# Patient Record
Sex: Female | Born: 1970
Health system: Southern US, Community
[De-identification: ages and names within clinical notes are randomized; demographics above are authoritative.]

## PROBLEM LIST (undated history)

## (undated) DIAGNOSIS — F419 Anxiety disorder, unspecified: Secondary | ICD-10-CM

## (undated) DIAGNOSIS — E079 Disorder of thyroid, unspecified: Secondary | ICD-10-CM

## (undated) DIAGNOSIS — C4490 Unspecified malignant neoplasm of skin, unspecified: Secondary | ICD-10-CM

## (undated) DIAGNOSIS — R011 Cardiac murmur, unspecified: Secondary | ICD-10-CM

## (undated) DIAGNOSIS — E119 Type 2 diabetes mellitus without complications: Secondary | ICD-10-CM

## (undated) DIAGNOSIS — B029 Zoster without complications: Secondary | ICD-10-CM

## (undated) DIAGNOSIS — Z923 Personal history of irradiation: Secondary | ICD-10-CM

## (undated) DIAGNOSIS — K5900 Constipation, unspecified: Secondary | ICD-10-CM

## (undated) DIAGNOSIS — B019 Varicella without complication: Secondary | ICD-10-CM

## (undated) DIAGNOSIS — G4730 Sleep apnea, unspecified: Secondary | ICD-10-CM

## (undated) DIAGNOSIS — C50919 Malignant neoplasm of unspecified site of unspecified female breast: Secondary | ICD-10-CM

## (undated) DIAGNOSIS — M1990 Unspecified osteoarthritis, unspecified site: Secondary | ICD-10-CM

## (undated) HISTORY — DX: Cardiac murmur, unspecified: R01.1

## (undated) HISTORY — DX: Varicella without complication: B01.9

## (undated) HISTORY — PX: HEMORRHOID BANDING: SHX5850

## (undated) HISTORY — DX: Unspecified osteoarthritis, unspecified site: M19.90

## (undated) HISTORY — DX: Anxiety disorder, unspecified: F41.9

## (undated) HISTORY — PX: OTHER SURGICAL HISTORY: SHX169

## (undated) HISTORY — DX: Sleep apnea, unspecified: G47.30

## (undated) HISTORY — DX: Constipation, unspecified: K59.00

## (undated) HISTORY — DX: Disorder of thyroid, unspecified: E07.9

## (undated) HISTORY — DX: Type 2 diabetes mellitus without complications: E11.9

## (undated) HISTORY — DX: Zoster without complications: B02.9

---

## 2008-05-10 ENCOUNTER — Ambulatory Visit: Payer: Self-pay | Admitting: Family Medicine

## 2008-05-27 ENCOUNTER — Ambulatory Visit: Payer: Self-pay | Admitting: Family Medicine

## 2009-06-13 ENCOUNTER — Ambulatory Visit (HOSPITAL_COMMUNITY): Admission: RE | Admit: 2009-06-13 | Discharge: 2009-06-13 | Payer: Self-pay | Admitting: Specialist

## 2009-11-08 ENCOUNTER — Other Ambulatory Visit: Payer: Self-pay | Admitting: Obstetrics and Gynecology

## 2010-05-22 ENCOUNTER — Ambulatory Visit: Payer: Self-pay

## 2010-06-10 LAB — HM PAP SMEAR: HM Pap smear: NORMAL

## 2011-06-06 ENCOUNTER — Ambulatory Visit: Payer: Self-pay | Admitting: Obstetrics and Gynecology

## 2012-02-20 DIAGNOSIS — C50919 Malignant neoplasm of unspecified site of unspecified female breast: Secondary | ICD-10-CM

## 2012-02-20 DIAGNOSIS — Z923 Personal history of irradiation: Secondary | ICD-10-CM

## 2012-02-20 HISTORY — DX: Personal history of irradiation: Z92.3

## 2012-02-20 HISTORY — DX: Malignant neoplasm of unspecified site of unspecified female breast: C50.919

## 2012-03-21 ENCOUNTER — Telehealth: Payer: Self-pay | Admitting: Internal Medicine

## 2012-03-21 ENCOUNTER — Other Ambulatory Visit: Payer: Self-pay | Admitting: Internal Medicine

## 2012-03-21 MED ORDER — CIPROFLOXACIN HCL 250 MG PO TABS: 250.0000 mg | ORAL_TABLET | Freq: Two times a day (BID) | ORAL | Status: DC

## 2012-03-21 MED ORDER — AZITHROMYCIN 500 MG PO TABS: 500.0000 mg | ORAL_TABLET | Freq: Every day | ORAL | Status: DC

## 2012-03-21 NOTE — Telephone Encounter (Signed)
Pt was calling and saying you had told her to get set up but she was already set up for an appt with you in April. She wasn't sure if you needed additional information and wanted me to send you a message to make sure there wasn't anything else you needed ???

## 2012-03-21 NOTE — Telephone Encounter (Signed)
No, just needed to make sure she was set up. i have no way of telling when I pull up her chart and no provider is listed.

## 2012-06-09 ENCOUNTER — Encounter: Payer: Self-pay | Admitting: *Deleted

## 2012-06-09 ENCOUNTER — Ambulatory Visit (INDEPENDENT_AMBULATORY_CARE_PROVIDER_SITE_OTHER): Payer: No Typology Code available for payment source | Admitting: Internal Medicine

## 2012-06-09 ENCOUNTER — Encounter: Payer: Self-pay | Admitting: Internal Medicine

## 2012-06-09 VITALS — BP 108/68 | HR 81 | Temp 98.2°F | Resp 16 | Ht 60.0 in | Wt 137.5 lb

## 2012-06-09 DIAGNOSIS — Z8639 Personal history of other endocrine, nutritional and metabolic disease: Secondary | ICD-10-CM

## 2012-06-09 DIAGNOSIS — Z862 Personal history of diseases of the blood and blood-forming organs and certain disorders involving the immune mechanism: Secondary | ICD-10-CM

## 2012-06-09 DIAGNOSIS — G8929 Other chronic pain: Secondary | ICD-10-CM

## 2012-06-09 DIAGNOSIS — M170 Bilateral primary osteoarthritis of knee: Secondary | ICD-10-CM

## 2012-06-09 DIAGNOSIS — M25539 Pain in unspecified wrist: Secondary | ICD-10-CM

## 2012-06-09 DIAGNOSIS — Z1239 Encounter for other screening for malignant neoplasm of breast: Secondary | ICD-10-CM

## 2012-06-09 DIAGNOSIS — I059 Rheumatic mitral valve disease, unspecified: Secondary | ICD-10-CM

## 2012-06-09 DIAGNOSIS — E559 Vitamin D deficiency, unspecified: Secondary | ICD-10-CM

## 2012-06-09 DIAGNOSIS — I341 Nonrheumatic mitral (valve) prolapse: Secondary | ICD-10-CM

## 2012-06-09 MED ORDER — MELOXICAM 15 MG PO TABS: 15.0000 mg | ORAL_TABLET | Freq: Every day | ORAL | Status: DC

## 2012-06-09 NOTE — Assessment & Plan Note (Signed)
She avoids sun,  Has prior low D,  Takes 5000 units daily.  Will check level.

## 2012-06-09 NOTE — Assessment & Plan Note (Signed)
dicussed etiologies,  Neurologic exam is normal. Secondary to keyboard use.

## 2012-06-09 NOTE — Assessment & Plan Note (Signed)
With prior episodes of syncope now quiescent,  And palpitations, occurring infreqently. Prior ECHO done in [place]

## 2012-06-09 NOTE — Assessment & Plan Note (Signed)
Checking serologies for autoimmune and crystal induced arthropathies.  Trial of glucosamine followed by meloxicam

## 2012-06-09 NOTE — Assessment & Plan Note (Signed)
Treated with PTU for 2 years in 2009.  With resolution .  Repeat TSH due

## 2012-06-09 NOTE — Patient Instructions (Addendum)
Ok to try Osteo biflex a minimum of 6 weeks as a jont lubricant  May use meloxicam  15 mg daily in the morning with breakfast for wrist pain ,

## 2012-06-09 NOTE — Progress Notes (Signed)
Patient ID: Patricia Crane, female   DOB: 09/21/1970, 42 y.o.   MRN: [id number]   Patient Active Problem List  Diagnosis  . Unspecified vitamin D deficiency  . Arthritis of both knees    Subjective:  CC:   Chief Complaint  Patient presents with  . Establish Care    HPI:   Patricia Crane is a 42 y.o. female who presents as a new patient to establish primary care with the chief complaint of  Joint pain ,  Both knees ,  Some blunt trauma  As a child, very stifdf after prolonged sitting.    aggravatyed by walking fast.  Back of knee feels enflamed at times.  noi increased pain lately,  Despite flying back from [place] April 9 .  Not bothered by yoga. Tender on the medial sides of the left, knee anf both sides of the right.  Both elbows and first metartarsal of left foot.,   Bilateral wrist pain,  Numbness in arms at night when sleeping on opposite side  Despite using a king size pillow for body bolster.  Seeing chiropractor which has improved. Cherre Huger. .  Adjustments done to cervical spine,  Spine was "straight"   History of low vitamin d  Hyperthyroid, treated with PTU for 2 years.  2009-2010.  Treated by [facility], syndrome resovled without I 131 or surgeyr   Heart murmur, history of palpitations which prior syncopal epsiodes worked in  Up [place] with ECHO and 24 Holter monitor   wotkd she has MVP.  Has not had a xyncopal epsidoe sin years, but epidose of palpitations still occurs about every 4 months,  Lasting 10 seconds      Breast pain initially on left ,  Now on right brought on by lying on right side. Notes some water retention after every flight to [place]    Past Medical History  Diagnosis Date  . Chicken pox   . Shingles   . Heart murmur   . Thyroid disease     Past Surgical History  Procedure Laterality Date  . Lasix Bilateral     Family History  Problem Relation Age of Onset  . Hyperlipidemia Mother   . Heart disease Mother   . Dementia Mother 82    alzheimers  . Cancer Father     tonsil ca  . Hyperlipidemia Sister   . Hyperlipidemia Brother   . Heart disease Maternal Aunt   . Hyperlipidemia Maternal Aunt   . Hypertension Maternal Aunt   . Heart disease Maternal Uncle   . Hyperlipidemia Maternal Uncle   . Stroke Maternal Uncle   . Hypertension Maternal Uncle   . Cancer Paternal Aunt 77    ovarian ca  . Drug abuse Paternal Aunt   . Cancer Paternal Uncle     lung CA ,  smoker    History   Social History  . Marital Status: Married    Spouse Name: N/A    Number of Children: N/A  . Years of Education: N/A   Occupational History  . Not on file.   Social History Main Topics  . Smoking status: Former Smoker -- 2.00 packs/day    Types: Cigarettes    Quit date: 06/10/2003  . Smokeless tobacco: Never Used     Comment: social smoker  . Alcohol Use: Yes     Comment: occasionally  . Drug Use: Not on file  . Sexually Active: Not on file   Other Topics Concern  .  Not on file   Social History Narrative  . No narrative on file       @ALLHX @    Review of Systems:   The remainder of the review of systems was negative except those addressed in the HPI.       Objective:  BP 108/68  Pulse 81  Temp(Src) 98.2 F (36.8 C) (Oral)  Resp 16  Ht 5' (1.524 m)  Wt 137 lb 8 oz (62.37 kg)  BMI 26.85 kg/m2  SpO2 98%  LMP 05/29/2012  General appearance: alert, cooperative and appears stated age Ears: normal TM's and external ear canals both ears Throat: lips, mucosa, and tongue normal; teeth and gums normal Neck: no adenopathy, no carotid bruit, supple, symmetrical, trachea midline and thyroid not enlarged, symmetric, no tenderness/mass/nodules Back: symmetric, no curvature. ROM normal. No CVA tenderness. Lungs: clear to auscultation bilaterally Heart: regular rate and rhythm, S1, S2 normal, no murmur, click, rub or gallop Abdomen: soft, non-tender; bowel sounds normal; no masses,  no organomegaly Pulses: 2+ and  symmetric Skin: Skin color, texture, turgor normal. No rashes or lesions Lymph nodes: Cervical, supraclavicular, and axillary nodes normal.  Assessment and Plan:  Arthritis of both knees Checking serologies for autoimmune and crystal induced arthropathies.  Trial of glucosamine followed by meloxicam  Unspecified vitamin D deficiency She avoids sun,  Has prior low D,  Takes 5000 units daily.  Will check level.   Mitral valve prolapse syndrome With prior episodes of syncope now quiescent,  And palpitations, occurring infreqently. Prior ECHO done in [place]    Wrist pain, chronic dicussed etiologies,  Neurologic exam is normal. Secondary to keyboard use.   History of Graves' disease Treated with PTU for 2 years in 2009.  With resolution .  Repeat TSH due   A total of 45 minutes was spent with patient more than half of which was spent in counseling, reviewing records from other prviders and coordination of care.  Updated Medication List Outpatient Encounter Prescriptions as of 06/09/2012  Medication Sig Dispense Refill  . Evening Primrose Oil 500 MG CAPS Take 500 mg by mouth daily.      . Melatonin-Pyridoxine (MELATIN) 3-1 MG TABS Take 3 mg by mouth at bedtime.      . Multiple Vitamins-Minerals (MULTIVITAMIN WITH MINERALS) tablet Take 1 tablet by mouth daily.      . Probiotic Product (ALIGN) 4 MG CAPS Take 4 mg by mouth daily.      Marland Kitchen azithromycin (ZITHROMAX) 500 MG tablet Take 1 tablet (500 mg total) by mouth daily.  7 tablet  0  . ciprofloxacin (CIPRO) 250 MG tablet Take 1 tablet (250 mg total) by mouth 2 (two) times daily.  14 tablet  0  . meloxicam (MOBIC) 15 MG tablet Take 1 tablet (15 mg total) by mouth daily.  30 tablet  3   No facility-administered encounter medications on file as of 06/09/2012.

## 2012-06-11 ENCOUNTER — Other Ambulatory Visit: Payer: Self-pay | Admitting: Internal Medicine

## 2012-06-16 LAB — IRON AND TIBC
Iron: 50 ug/dL (ref 35–155)
UIBC: 297 ug/dL (ref 150–375)

## 2012-06-16 LAB — CBC WITH DIFFERENTIAL
Basos: 1 % (ref 0–3)
Eos: 2 % (ref 0–5)
Eosinophils Absolute: 0.1 10*3/uL (ref 0.0–0.4)
Immature Grans (Abs): 0 10*3/uL (ref 0.0–0.1)
Lymphs: 37 % (ref 14–46)
MCH: 31.3 pg (ref 26.6–33.0)
MCHC: 34 g/dL (ref 31.5–35.7)
MCV: 92 fL (ref 79–97)
Monocytes Absolute: 0.3 10*3/uL (ref 0.1–0.9)
Neutrophils Relative %: 55 % (ref 40–74)
WBC: 6 10*3/uL (ref 3.4–10.8)

## 2012-06-16 LAB — B12 AND FOLATE PANEL
Folate: 14.1 ng/mL (ref >3.0)
Vitamin B-12: 692 pg/mL (ref 211–946)

## 2012-06-16 LAB — COMPREHENSIVE METABOLIC PANEL
AST: 14 IU/L (ref 0–40)
Albumin: 4.4 g/dL (ref 3.5–5.5)
BUN/Creatinine Ratio: 15 (ref 9–23)
BUN: 11 mg/dL (ref 6–24)
CO2: 23 mmol/L (ref 19–28)
Chloride: 102 mmol/L (ref 97–108)
Creatinine, Ser: 0.72 mg/dL (ref 0.57–1.00)
GFR calc non Af Amer: 103 mL/min/{1.73_m2} (ref >59)
Glucose: 103 mg/dL — ABNORMAL HIGH (ref 65–99)
Sodium: 140 mmol/L (ref 134–144)
Total Bilirubin: 0.3 mg/dL (ref 0.0–1.2)

## 2012-06-16 LAB — TSH: TSH: 1.31 u[IU]/mL (ref 0.450–4.500)

## 2012-06-16 LAB — LIPID PANEL W/O CHOL/HDL RATIO
Cholesterol, Total: 199 mg/dL (ref 100–199)
HDL: 63 mg/dL (ref >39)
VLDL Cholesterol Cal: 14 mg/dL (ref 5–40)

## 2012-06-17 ENCOUNTER — Telehealth: Payer: Self-pay | Admitting: Internal Medicine

## 2012-06-17 ENCOUNTER — Encounter: Payer: Self-pay | Admitting: Internal Medicine

## 2012-06-17 ENCOUNTER — Ambulatory Visit: Payer: Self-pay | Admitting: Internal Medicine

## 2012-06-17 DIAGNOSIS — R920 Mammographic microcalcification found on diagnostic imaging of breast: Secondary | ICD-10-CM

## 2012-06-17 MED ORDER — ERGOCALCIFEROL 1.25 MG (50000 UT) PO CAPS: 50000.0000 [IU] | ORAL_CAPSULE | ORAL | Status: DC

## 2012-06-17 NOTE — Telephone Encounter (Signed)
Patricia Crane's mammogram  Was incomplete on the left.  They are requesting additional images.  I will order the additonal imaging.

## 2012-06-17 NOTE — Addendum Note (Signed)
Addended by: Sherlene Shams on: 06/17/2012 12:56 PM   Modules accepted: Orders

## 2012-06-18 ENCOUNTER — Ambulatory Visit: Payer: Self-pay | Admitting: Internal Medicine

## 2012-06-18 NOTE — Telephone Encounter (Signed)
Dr. Marice Potter office calling to report that pt is Category 5.  States pt is very upset and is ready to proceed with next steps.

## 2012-06-18 NOTE — Telephone Encounter (Signed)
Amanda @ [facility] to contact patient to schedule additional imaging.

## 2012-06-19 ENCOUNTER — Telehealth: Payer: Self-pay | Admitting: Emergency Medicine

## 2012-06-19 HISTORY — PX: BREAST BIOPSY: SHX20

## 2012-06-19 NOTE — Telephone Encounter (Signed)
Patient wants to see Dr Michela Pitcher

## 2012-06-19 NOTE — Telephone Encounter (Signed)
LVM for pt to return my call to offer surgeons in our area and to proceed with an apt.

## 2012-06-20 ENCOUNTER — Encounter: Payer: Self-pay | Admitting: Emergency Medicine

## 2012-06-23 ENCOUNTER — Telehealth: Payer: Self-pay

## 2012-06-23 NOTE — Telephone Encounter (Signed)
Patient sent a message to set up appointment for a pap smear. Called and left message for patient to give a call back to office to set up for appointment

## 2012-06-25 ENCOUNTER — Encounter: Payer: Self-pay | Admitting: Internal Medicine

## 2012-06-25 ENCOUNTER — Ambulatory Visit: Payer: Self-pay | Admitting: Oncology

## 2012-06-25 DIAGNOSIS — C50919 Malignant neoplasm of unspecified site of unspecified female breast: Secondary | ICD-10-CM | POA: Insufficient documentation

## 2012-06-25 DIAGNOSIS — Z853 Personal history of malignant neoplasm of breast: Secondary | ICD-10-CM | POA: Insufficient documentation

## 2012-06-26 ENCOUNTER — Ambulatory Visit: Payer: Self-pay | Admitting: Oncology

## 2012-07-02 ENCOUNTER — Other Ambulatory Visit: Payer: Self-pay | Admitting: Oncology

## 2012-07-02 DIAGNOSIS — C50912 Malignant neoplasm of unspecified site of left female breast: Secondary | ICD-10-CM

## 2012-07-07 ENCOUNTER — Encounter: Payer: Self-pay | Admitting: Internal Medicine

## 2012-07-15 ENCOUNTER — Other Ambulatory Visit: Payer: No Typology Code available for payment source

## 2012-07-20 ENCOUNTER — Ambulatory Visit: Payer: Self-pay | Admitting: Oncology

## 2012-07-23 ENCOUNTER — Encounter: Payer: Self-pay | Admitting: Internal Medicine

## 2012-07-23 ENCOUNTER — Ambulatory Visit (INDEPENDENT_AMBULATORY_CARE_PROVIDER_SITE_OTHER): Payer: No Typology Code available for payment source | Admitting: Internal Medicine

## 2012-07-23 VITALS — BP 102/68 | HR 80 | Temp 98.4°F | Resp 16 | Ht 60.0 in | Wt 138.2 lb

## 2012-07-23 DIAGNOSIS — D0590 Unspecified type of carcinoma in situ of unspecified breast: Secondary | ICD-10-CM

## 2012-07-23 DIAGNOSIS — Z124 Encounter for screening for malignant neoplasm of cervix: Secondary | ICD-10-CM

## 2012-07-23 DIAGNOSIS — D0511 Intraductal carcinoma in situ of right breast: Secondary | ICD-10-CM

## 2012-07-23 DIAGNOSIS — Z0000 Encounter for general adult medical examination without abnormal findings: Secondary | ICD-10-CM

## 2012-07-23 MED ORDER — ALPRAZOLAM 0.25 MG PO TABS: 0.2500 mg | ORAL_TABLET | Freq: Two times a day (BID) | ORAL | Status: DC | PRN

## 2012-07-23 NOTE — Progress Notes (Signed)
Patient ID: Patricia Crane, female   DOB: [birth year], 42 y.o.   MRN: [id number]   Subjective:     Patricia Crane is a 42 y.o. female and is here for a comprehensive physical exam.   History   Social History  . Marital Status: Married    Spouse Name: N/A    Number of Children: N/A  . Years of Education: N/A   Occupational History  . Not on file.   Social History Main Topics  . Smoking status: Former Smoker -- 2.00 packs/day    Types: Cigarettes    Quit date: 06/10/2003  . Smokeless tobacco: Never Used     Comment: social smoker  . Alcohol Use: Yes     Comment: occasionally  . Drug Use: Not on file  . Sexually Active: Not on file   Other Topics Concern  . Not on file   Social History Narrative  . No narrative on file   Health Maintenance  Topic Date Due  . Tetanus/tdap  08/09/1987  . Influenza Vaccine  10/20/2012  . Pap Smear  07/24/2015    The following portions of the patient's history were reviewed and updated as appropriate: allergies, current medications, past family history, past medical history, past social history, past surgical history and problem list.  Review of Systems A comprehensive review of systems was negative.   Objective:  BP 102/68  Pulse 80  Temp(Src) 98.4 F (36.9 C) (Oral)  Resp 16  Ht 5' (1.524 m)  Wt 138 lb 4 oz (62.71 kg)  BMI 27 kg/m2  SpO2 98%  LMP 05/30/2012  General Appearance:    Alert, cooperative, no distress, appears stated age  Head:    Normocephalic, without obvious abnormality, atraumatic  Eyes:    PERRL, conjunctiva/corneas clear, EOM's intact, fundi    benign, both eyes  Ears:    Normal TM's and external ear canals, both ears  Nose:   Nares normal, septum midline, mucosa normal, no drainage    or sinus tenderness  Throat:   Lips, mucosa, and tongue normal; teeth and gums normal  Neck:   Supple, symmetrical, trachea midline, no adenopathy;    thyroid:  no enlargement/tenderness/nodules; no carotid   bruit or JVD   Back:     Symmetric, no curvature, ROM normal, no CVA tenderness  Lungs:     Clear to auscultation bilaterally, respirations unlabored  Chest Wall:    No tenderness or deformity   Heart:    Regular rate and rhythm, S1 and S2 normal, no murmur, rub   or gallop  Breast Exam:    Tenderness without mass on right medial ,  Resolving bruise.  Mass appreciate on left per mammogram  Abdomen:     Soft, non-tender, bowel sounds active all four quadrants,    no masses, no organomegaly  Genitalia:    Pelvic: cervix normal in appearance, external genitalia normal, no adnexal masses or tenderness, no cervical motion tenderness, rectovaginal septum normal, uterus normal size, shape, and consistency and vagina normal without discharge  Extremities:   Extremities normal, atraumatic, no cyanosis or edema  Pulses:   2+ and symmetric all extremities  Skin:   Skin color, texture, turgor normal, no rashes or lesions  Lymph nodes:   Cervical, supraclavicular, and axillary nodes normal  Neurologic:   CNII-XII intact, normal strength, sensation and reflexes    throughout    Assessment:   Neoplasm of breast, primary tumor staging category Tis: ductal carcinoma in situ (DCIS)  Lumpectomy and treatment TBD,  Dr Michela Pitcher for surgery and Dr Koleen Nimrod managing treatment  Routine general medical examination at a health care facility Annual comprehensive exam was done including breast, pelvic and PAP smear. All screenings have been addressed .    Updated Medication List Outpatient Encounter Prescriptions as of 07/23/2012  Medication Sig Dispense Refill  . ergocalciferol (DRISDOL) 50000 UNITS capsule Take 1 capsule (50,000 Units total) by mouth once a week.  4 capsule  0  . Melatonin-Pyridoxine (MELATIN) 3-1 MG TABS Take 3 mg by mouth at bedtime.      . Multiple Vitamins-Minerals (MULTIVITAMIN WITH MINERALS) tablet Take 1 tablet by mouth daily.      . Probiotic Product (ALIGN) 4 MG CAPS Take 4 mg by mouth daily.      Marland Kitchen  ALPRAZolam (XANAX) 0.25 MG tablet Take 1 tablet (0.25 mg total) by mouth 2 (two) times daily as needed for sleep or anxiety.  30 tablet  0  . azithromycin (ZITHROMAX) 500 MG tablet Take 1 tablet (500 mg total) by mouth daily.  7 tablet  0  . ciprofloxacin (CIPRO) 250 MG tablet Take 1 tablet (250 mg total) by mouth 2 (two) times daily.  14 tablet  0  . Evening Primrose Oil 500 MG CAPS Take 500 mg by mouth daily.      . meloxicam (MOBIC) 15 MG tablet Take 1 tablet (15 mg total) by mouth daily.  30 tablet  3   No facility-administered encounter medications on file as of 07/23/2012.

## 2012-07-24 ENCOUNTER — Other Ambulatory Visit (HOSPITAL_COMMUNITY)
Admission: RE | Admit: 2012-07-24 | Discharge: 2012-07-24 | Disposition: A | Discharge status: Home / Self Care | Payer: No Typology Code available for payment source | Source: Ambulatory Visit | Attending: Internal Medicine | Admitting: Internal Medicine

## 2012-07-24 DIAGNOSIS — Z1151 Encounter for screening for human papillomavirus (HPV): Secondary | ICD-10-CM | POA: Insufficient documentation

## 2012-07-24 DIAGNOSIS — Z01419 Encounter for gynecological examination (general) (routine) without abnormal findings: Secondary | ICD-10-CM | POA: Insufficient documentation

## 2012-07-25 NOTE — Assessment & Plan Note (Signed)
Annual comprehensive exam was done including breast, pelvic and PAP smear. All screenings have been addressed .  

## 2012-07-25 NOTE — Assessment & Plan Note (Signed)
Lumpectomy and treatment TBD,  Dr Michela Pitcher for surgery and Dr Koleen Nimrod managing treatment

## 2012-07-28 ENCOUNTER — Encounter: Payer: Self-pay | Admitting: Internal Medicine

## 2012-07-31 ENCOUNTER — Encounter: Payer: Self-pay | Admitting: Internal Medicine

## 2012-08-11 ENCOUNTER — Ambulatory Visit
Admission: RE | Admit: 2012-08-11 | Discharge: 2012-08-11 | Disposition: A | Discharge status: Home / Self Care | Payer: No Typology Code available for payment source | Source: Ambulatory Visit | Attending: Oncology | Admitting: Oncology

## 2012-08-11 DIAGNOSIS — C50912 Malignant neoplasm of unspecified site of left female breast: Secondary | ICD-10-CM

## 2012-08-11 MED ORDER — GADOBENATE DIMEGLUMINE 529 MG/ML IV SOLN
12.0000 mL | Freq: Once | INTRAVENOUS | Status: AC | PRN
  Administered 2012-08-11: 12 mL via INTRAVENOUS

## 2012-08-12 ENCOUNTER — Ambulatory Visit: Payer: Self-pay | Admitting: Surgery

## 2012-08-12 LAB — BASIC METABOLIC PANEL
Anion Gap: 4 — ABNORMAL LOW (ref 7–16)
BUN: 11 mg/dL (ref 7–18)
Calcium, Total: 9 mg/dL (ref 8.5–10.1)
EGFR (Non-African Amer.): >60
Osmolality: 277 (ref 275–301)
Potassium: 4 mmol/L (ref 3.5–5.1)
Sodium: 139 mmol/L (ref 136–145)

## 2012-08-12 LAB — CBC WITH DIFFERENTIAL/PLATELET
Basophil #: 0 10*3/uL (ref 0.0–0.1)
Eosinophil #: 0.1 10*3/uL (ref 0.0–0.7)
HCT: 37.9 % (ref 35.0–47.0)
HGB: 13 g/dL (ref 12.0–16.0)
Lymphocyte #: 1.9 10*3/uL (ref 1.0–3.6)
Lymphocyte %: 34.4 %
MCH: 30.9 pg (ref 26.0–34.0)
MCV: 91 fL (ref 80–100)
Monocyte #: 0.4 x10 3/mm (ref 0.2–0.9)
Monocyte %: 6.7 %
Neutrophil #: 3.1 10*3/uL (ref 1.4–6.5)
Neutrophil %: 56 %
Platelet: 313 10*3/uL (ref 150–440)
RBC: 4.19 10*6/uL (ref 3.80–5.20)
RDW: 13.9 % (ref 11.5–14.5)
WBC: 5.5 10*3/uL (ref 3.6–11.0)

## 2012-08-12 LAB — HCG, QUANTITATIVE, PREGNANCY: Beta Hcg, Quant.: <1 m[IU]/mL — ABNORMAL LOW

## 2012-08-18 ENCOUNTER — Ambulatory Visit: Payer: Self-pay | Admitting: Surgery

## 2012-08-18 HISTORY — PX: BREAST LUMPECTOMY: SHX2

## 2012-08-19 LAB — CBC WITH DIFFERENTIAL/PLATELET
Basophil %: 0.1 %
HCT: 31.6 % — ABNORMAL LOW (ref 35.0–47.0)
HGB: 10.5 g/dL — ABNORMAL LOW (ref 12.0–16.0)
Lymphocyte #: 2 10*3/uL (ref 1.0–3.6)
Lymphocyte %: 18.3 %
MCH: 30.5 pg (ref 26.0–34.0)
MCHC: 33.1 g/dL (ref 32.0–36.0)
MCV: 92 fL (ref 80–100)
Monocyte #: 0.6 x10 3/mm (ref 0.2–0.9)
Neutrophil #: 8.5 10*3/uL — ABNORMAL HIGH (ref 1.4–6.5)
Platelet: 271 10*3/uL (ref 150–440)
RDW: 13.6 % (ref 11.5–14.5)

## 2012-08-26 ENCOUNTER — Ambulatory Visit: Payer: Self-pay | Admitting: Oncology

## 2012-08-29 LAB — PATHOLOGY REPORT

## 2012-09-03 ENCOUNTER — Ambulatory Visit: Payer: No Typology Code available for payment source | Admitting: Internal Medicine

## 2012-09-05 ENCOUNTER — Ambulatory Visit: Payer: No Typology Code available for payment source | Admitting: Internal Medicine

## 2012-09-16 ENCOUNTER — Encounter: Payer: Self-pay | Admitting: Internal Medicine

## 2012-09-16 ENCOUNTER — Ambulatory Visit (INDEPENDENT_AMBULATORY_CARE_PROVIDER_SITE_OTHER): Payer: No Typology Code available for payment source | Admitting: Internal Medicine

## 2012-09-16 VITALS — BP 106/68 | HR 73 | Temp 98.1°F | Resp 14 | Wt 134.5 lb

## 2012-09-16 DIAGNOSIS — D649 Anemia, unspecified: Secondary | ICD-10-CM

## 2012-09-16 DIAGNOSIS — D0512 Intraductal carcinoma in situ of left breast: Secondary | ICD-10-CM

## 2012-09-16 DIAGNOSIS — L240 Irritant contact dermatitis due to detergents: Secondary | ICD-10-CM

## 2012-09-16 DIAGNOSIS — C50919 Malignant neoplasm of unspecified site of unspecified female breast: Secondary | ICD-10-CM

## 2012-09-16 MED ORDER — TRIAMCINOLONE ACETONIDE 0.025 % EX OINT: TOPICAL_OINTMENT | Freq: Two times a day (BID) | CUTANEOUS | Status: DC

## 2012-09-16 MED ORDER — MELOXICAM 15 MG PO TABS: 15.0000 mg | ORAL_TABLET | Freq: Every day | ORAL | Status: DC

## 2012-09-16 NOTE — Assessment & Plan Note (Addendum)
S/p lumpectomy and Sentinel node dissection. Patient plans to forego chemotherapy and start radiation therapy next week with Dr. Aggie Cosier.

## 2012-09-16 NOTE — Progress Notes (Signed)
Patient ID: Patricia Crane, female   DOB: Mar 27, 1970, 42 y.o.   MRN: [id number]   Patient Active Problem List   Diagnosis Date Noted  . Contact dermatitis and other eczema due to detergents 09/17/2012  . Anemia 09/16/2012  . Routine general medical examination at a health care facility 07/25/2012  . Neoplasm of breast, primary tumor staging category Tis: ductal carcinoma in situ (DCIS) 06/25/2012  . Unspecified vitamin D deficiency 06/09/2012  . Arthritis of both knees 06/09/2012  . Mitral valve prolapse syndrome 06/09/2012  . Wrist pain, chronic 06/09/2012  . History of Graves' disease 06/09/2012    Subjective:  CC:   Chief Complaint  Patient presents with  . Follow-up    hospital    HPI:   Patricia Ohnemus Smithis a 42 y.o. female who presents hospital followup.  She was admitted for partial mastectomy for management of invasive ductal carcinoma in situ.  The surgery was Complicated by a large hematoma which required wound exploration and drainage.  Review of hospital records doesn't indicate that she lost 2 units of blood from the procedure. She feels generally tired and fatigued. She has been exercising her left arm and raising above her head for 2 hours a day to prevent retention of fluid. She is trying to sleep with her arm above her head this finding is to be quite uncomfortable. She has met with Dr. Sedalia Muta he who has recommended chemotherapy to improve her survival and reduce her risk of recurrence but she is unwilling to do this because her father is suffering from stage IV tonsillary cancer and she may need to travel to [place] to see him at a moments notice.  she has decided to forego chemotherapy and content start her adjuvant therapy and radiation next week with Dr. Aggie Cosier   2) She has a New onset pruritic  Rash.  She the rash has occurred in both ankles but is most noticeable on her medial left ankle. She has no history of psoriasis or eczema. He has her work remotely on her elbows.  She's been using Zyrtec and hydrocortisone which has helped. She does have an indoor cat, but her cat was recently utilized for health problems.   Past Medical History  Diagnosis Date  . Chicken pox   . Shingles   . Heart murmur   . Thyroid disease   . Cancer May 2014    Breast     Past Surgical History  Procedure Laterality Date  . Lasix Bilateral        The following portions of the patient's history were reviewed and updated as appropriate: Allergies, current medications, and problem list.    Review of Systems:   Patient denies headache, fevers, malaise, unintentional weight loss, skin rash, eye pain, sinus congestion and sinus pain, sore throat, dysphagia,  hemoptysis , cough, dyspnea, wheezing, chest pain, palpitations, orthopnea, edema, abdominal pain, nausea, melena, diarrhea, constipation, flank pain, dysuria, hematuria, urinary  Frequency, nocturia, numbness, tingling, seizures,  Focal weakness, Loss of consciousness,  Tremor, insomnia, depression, anxiety, and suicidal ideation.     History   Social History  . Marital Status: Married    Spouse Name: N/A    Number of Children: N/A  . Years of Education: N/A   Occupational History  . Not on file.   Social History Main Topics  . Smoking status: Former Smoker -- 2.00 packs/day    Types: Cigarettes    Quit date: 06/10/2003  . Smokeless tobacco: Never Used  Comment: social smoker  . Alcohol Use: Yes     Comment: occasionally  . Drug Use: Not on file  . Sexually Active: Not on file   Other Topics Concern  . Not on file   Social History Narrative  . No narrative on file    Objective:  BP 106/68  Pulse 73  Temp(Src) 98.1 F (36.7 C) (Oral)  Resp 14  Wt 134 lb 8 oz (61.009 kg)  BMI 26.27 kg/m2  SpO2 99%  LMP 08/02/2012  General appearance: alert, cooperative and appears stated age Ears: normal TM's and external ear canals both ears Throat: lips, mucosa, and tongue normal; teeth and gums  normal Neck: no adenopathy, no carotid bruit, supple, symmetrical, trachea midline and thyroid not enlarged, symmetric, no tenderness/mass/nodules Back: symmetric, no curvature. ROM normal. No CVA tenderness. Lungs: clear to auscultation bilaterally Heart: regular rate and rhythm, S1, S2 normal, no murmur, click, rub or gallop Abdomen: soft, non-tender; bowel sounds normal; no masses,  no organomegaly Pulses: 2+ and symmetric Skin: Skin color, texture, turgor normal. lichenifid rash inner medial aspect of left ankle.  Lymph nodes: Cervical, supraclavicular, and axillary nodes normal.  Assessment and Plan:  Neoplasm of breast, primary tumor staging category Tis: ductal carcinoma in situ (DCIS) S/p lumpectomy and Sentinel node dissection. Patient plans to forego chemotherapy and start radiation therapy next week with Dr. Aggie Cosier.  Anemia Her repeat hemoglobin has improved from 10.5 at discharge 2 weeks ago to 12.6 yesterday. Her iron B12 and folate were also checked. B12 and folate were fine. Iron was borderline.  Contact dermatitis and other eczema due to detergents Improving, likely secondary to something she can contact with during her recent hospitalization. Triamcinolone ointment twice a day when necessary.   Updated Medication List Outpatient Encounter Prescriptions as of 09/16/2012  Medication Sig Dispense Refill  . ALPRAZolam (XANAX) 0.25 MG tablet Take 1 tablet (0.25 mg total) by mouth 2 (two) times daily as needed for sleep or anxiety.  30 tablet  0  . Cholecalciferol (SM VITAMIN D3) 1000 UNITS tablet Take 1,000 Units by mouth daily.      . Melatonin-Pyridoxine (MELATIN) 3-1 MG TABS Take 3 mg by mouth at bedtime.      . Multiple Vitamins-Minerals (MULTIVITAMIN WITH MINERALS) tablet Take 1 tablet by mouth daily.      . Probiotic Product (ALIGN) 4 MG CAPS Take 4 mg by mouth daily.      Marland Kitchen azithromycin (ZITHROMAX) 500 MG tablet Take 1 tablet (500 mg total) by mouth daily.  7 tablet   0  . ciprofloxacin (CIPRO) 250 MG tablet Take 1 tablet (250 mg total) by mouth 2 (two) times daily.  14 tablet  0  . ergocalciferol (DRISDOL) 50000 UNITS capsule Take 1 capsule (50,000 Units total) by mouth once a week.  4 capsule  0  . Evening Primrose Oil 500 MG CAPS Take 500 mg by mouth daily.      . meloxicam (MOBIC) 15 MG tablet Take 1 tablet (15 mg total) by mouth daily.  30 tablet  3  . triamcinolone (KENALOG) 0.025 % ointment Apply topically 2 (two) times daily.  30 g  0  . [DISCONTINUED] meloxicam (MOBIC) 15 MG tablet Take 1 tablet (15 mg total) by mouth daily.  30 tablet  3   No facility-administered encounter medications on file as of 09/16/2012.     Orders Placed This Encounter  Procedures  . Ferritin  . Iron and TIBC  . B12  .  Methylmalonic Acid  . CBC with Differential    No Follow-up on file.

## 2012-09-16 NOTE — Patient Instructions (Addendum)
Try taking the meloxicam  In the evening and two tylenol before bedtime   Support the arm by your side with a pillow  Instead of over head  Iron studies  Today  triamcinolone ointment twice daily apply to rash

## 2012-09-17 ENCOUNTER — Encounter: Payer: Self-pay | Admitting: Internal Medicine

## 2012-09-17 DIAGNOSIS — L240 Irritant contact dermatitis due to detergents: Secondary | ICD-10-CM | POA: Insufficient documentation

## 2012-09-17 LAB — CBC WITH DIFFERENTIAL/PLATELET
Eosinophils Absolute: 0.2 10*3/uL (ref 0.0–0.7)
HCT: 38.4 % (ref 36.0–46.0)
Hemoglobin: 12.6 g/dL (ref 12.0–15.0)
Lymphocytes Relative: 33.5 % (ref 12.0–46.0)
Monocytes Relative: 5.4 % (ref 3.0–12.0)
RBC: 4.15 Mil/uL (ref 3.87–5.11)
WBC: 7.1 10*3/uL (ref 4.5–10.5)

## 2012-09-17 LAB — IRON AND TIBC
%SAT: 13 % — ABNORMAL LOW (ref 20–55)
Iron: 53 ug/dL (ref 42–145)
TIBC: 406 ug/dL (ref 250–470)
UIBC: 353 ug/dL (ref 125–400)

## 2012-09-17 LAB — FERRITIN: Ferritin: 7 ng/mL — ABNORMAL LOW (ref 10.0–291.0)

## 2012-09-17 NOTE — Assessment & Plan Note (Signed)
Her repeat hemoglobin has improved from 10.5 at discharge 2 weeks ago to 12.6 yesterday. Her iron B12 and folate were also checked. B12 and folate were fine. Iron was borderline.

## 2012-09-17 NOTE — Assessment & Plan Note (Signed)
Improving, likely secondary to something she can contact with during her recent hospitalization. Triamcinolone ointment twice a day when necessary.

## 2012-09-19 ENCOUNTER — Ambulatory Visit: Payer: Self-pay | Admitting: Oncology

## 2012-09-19 ENCOUNTER — Encounter: Payer: Self-pay | Admitting: Internal Medicine

## 2012-09-19 LAB — METHYLMALONIC ACID, SERUM: Methylmalonic Acid, Quant: 0.12 umol/L (ref <0.40)

## 2012-09-22 ENCOUNTER — Encounter: Payer: Self-pay | Admitting: *Deleted

## 2012-10-03 ENCOUNTER — Encounter: Payer: Self-pay | Admitting: Internal Medicine

## 2012-10-10 LAB — CBC CANCER CENTER
Basophil #: 0 x10 3/mm (ref 0.0–0.1)
Basophil %: 0.6 %
Eosinophil #: 0.1 x10 3/mm (ref 0.0–0.7)
HCT: 37.8 % (ref 35.0–47.0)
HGB: 13 g/dL (ref 12.0–16.0)
Lymphocyte %: 36.8 %
MCHC: 34.3 g/dL (ref 32.0–36.0)
Monocyte #: 0.4 x10 3/mm (ref 0.2–0.9)
Monocyte %: 7.9 %
Neutrophil %: 52.6 %
Platelet: 280 x10 3/mm (ref 150–440)
RBC: 4.17 10*6/uL (ref 3.80–5.20)
RDW: 13.8 % (ref 11.5–14.5)
WBC: 5.2 x10 3/mm (ref 3.6–11.0)

## 2012-10-17 LAB — CBC CANCER CENTER
Eosinophil #: 0.2 x10 3/mm (ref 0.0–0.7)
Eosinophil %: 3 %
HCT: 39.6 % (ref 35.0–47.0)
HGB: 13.4 g/dL (ref 12.0–16.0)
Lymphocyte #: 1.7 x10 3/mm (ref 1.0–3.6)
Lymphocyte %: 28.3 %
MCHC: 33.9 g/dL (ref 32.0–36.0)
MCV: 90 fL (ref 80–100)
Monocyte #: 0.5 x10 3/mm (ref 0.2–0.9)
Monocyte %: 7.4 %
Neutrophil %: 60.6 %
Platelet: 295 x10 3/mm (ref 150–440)
RBC: 4.39 10*6/uL (ref 3.80–5.20)
RDW: 13.5 % (ref 11.5–14.5)
WBC: 6.2 x10 3/mm (ref 3.6–11.0)

## 2012-10-20 ENCOUNTER — Ambulatory Visit: Payer: Self-pay | Admitting: Oncology

## 2012-11-07 LAB — CBC CANCER CENTER
Basophil #: 0 x10 3/mm (ref 0.0–0.1)
Eosinophil %: 2.6 %
Lymphocyte #: 1.1 x10 3/mm (ref 1.0–3.6)
Lymphocyte %: 21.7 %
MCH: 30.7 pg (ref 26.0–34.0)
MCV: 91 fL (ref 80–100)
Monocyte %: 10 %
Neutrophil #: 3.4 x10 3/mm (ref 1.4–6.5)
Neutrophil %: 65.1 %
Platelet: 282 x10 3/mm (ref 150–440)
RBC: 4.29 10*6/uL (ref 3.80–5.20)
RDW: 13.9 % (ref 11.5–14.5)
WBC: 5.3 x10 3/mm (ref 3.6–11.0)

## 2012-11-14 LAB — CBC CANCER CENTER
Basophil #: 0 x10 3/mm (ref 0.0–0.1)
Eosinophil #: 0.1 x10 3/mm (ref 0.0–0.7)
HCT: 40.9 % (ref 35.0–47.0)
HGB: 13.7 g/dL (ref 12.0–16.0)
Lymphocyte #: 1.5 x10 3/mm (ref 1.0–3.6)
MCHC: 33.6 g/dL (ref 32.0–36.0)
MCV: 91 fL (ref 80–100)
Platelet: 304 x10 3/mm (ref 150–440)
RDW: 13.4 % (ref 11.5–14.5)
WBC: 5.2 x10 3/mm (ref 3.6–11.0)

## 2012-11-16 ENCOUNTER — Other Ambulatory Visit: Payer: Self-pay | Admitting: Internal Medicine

## 2012-11-18 ENCOUNTER — Other Ambulatory Visit: Payer: Self-pay | Admitting: Internal Medicine

## 2012-11-18 MED ORDER — ALPRAZOLAM 0.25 MG PO TABS: 0.2500 mg | ORAL_TABLET | Freq: Two times a day (BID) | ORAL | Status: DC | PRN

## 2012-11-18 NOTE — Telephone Encounter (Signed)
Refill on

## 2012-11-19 ENCOUNTER — Ambulatory Visit: Payer: Self-pay | Admitting: Oncology

## 2012-12-04 LAB — CBC CANCER CENTER
Basophil #: 0 x10 3/mm (ref 0.0–0.1)
Eosinophil %: 2 %
HCT: 39.2 % (ref 35.0–47.0)
HGB: 13.4 g/dL (ref 12.0–16.0)
Lymphocyte #: 1.3 x10 3/mm (ref 1.0–3.6)
Lymphocyte %: 21.2 %
MCH: 31.2 pg (ref 26.0–34.0)
MCHC: 34.2 g/dL (ref 32.0–36.0)
Monocyte #: 0.4 x10 3/mm (ref 0.2–0.9)
Monocyte %: 6.6 %
Neutrophil #: 4.4 x10 3/mm (ref 1.4–6.5)
Neutrophil %: 69.8 %
Platelet: 309 x10 3/mm (ref 150–440)
RBC: 4.29 10*6/uL (ref 3.80–5.20)
RDW: 14 % (ref 11.5–14.5)

## 2012-12-04 LAB — COMPREHENSIVE METABOLIC PANEL
Albumin: 3.7 g/dL (ref 3.4–5.0)
Anion Gap: 9 (ref 7–16)
BUN: 11 mg/dL (ref 7–18)
Co2: 28 mmol/L (ref 21–32)
Creatinine: 0.84 mg/dL (ref 0.60–1.30)
Glucose: 118 mg/dL — ABNORMAL HIGH (ref 65–99)
Osmolality: 276 (ref 275–301)
SGOT(AST): 12 U/L — ABNORMAL LOW (ref 15–37)
SGPT (ALT): 22 U/L (ref 12–78)
Sodium: 138 mmol/L (ref 136–145)
Total Protein: 7.5 g/dL (ref 6.4–8.2)

## 2012-12-20 ENCOUNTER — Ambulatory Visit: Payer: Self-pay | Admitting: Oncology

## 2012-12-25 ENCOUNTER — Other Ambulatory Visit: Payer: Self-pay

## 2013-02-18 ENCOUNTER — Ambulatory Visit (INDEPENDENT_AMBULATORY_CARE_PROVIDER_SITE_OTHER): Payer: No Typology Code available for payment source | Admitting: Internal Medicine

## 2013-02-18 VITALS — BP 100/60 | HR 68 | Temp 98.0°F | Wt 139.0 lb

## 2013-02-18 DIAGNOSIS — B009 Herpesviral infection, unspecified: Secondary | ICD-10-CM

## 2013-02-18 MED ORDER — DOCOSANOL 10 % EX CREA: TOPICAL_CREAM | CUTANEOUS | Status: DC

## 2013-02-18 MED ORDER — VALACYCLOVIR HCL 500 MG PO TABS: 500.0000 mg | ORAL_TABLET | Freq: Every day | ORAL | Status: DC

## 2013-02-18 MED ORDER — ALPRAZOLAM 0.25 MG PO TABS: 0.2500 mg | ORAL_TABLET | Freq: Two times a day (BID) | ORAL | Status: DC | PRN

## 2013-02-18 NOTE — Patient Instructions (Signed)
Decrease the acyclovir to one time  daily as  Suppressive therapy  Use the abreva cream for the next few days as directed  If you do not tolerate acyclovir reduce dose,  Let me know and we will try switching to famciclovir

## 2013-02-18 NOTE — Progress Notes (Signed)
Pre visit review using our clinic review tool, if applicable. No additional management support is needed unless otherwise documented below in the visit note. 

## 2013-02-19 ENCOUNTER — Encounter: Payer: Self-pay | Admitting: Internal Medicine

## 2013-02-19 DIAGNOSIS — B009 Herpesviral infection, unspecified: Secondary | ICD-10-CM | POA: Insufficient documentation

## 2013-02-19 NOTE — Progress Notes (Signed)
Patient ID: Patricia Crane, female   DOB: [birth year], 43 y.o.   MRN: [id number]  Patient Active Problem List   Diagnosis Date Noted  . Herpes simplex infection of skin 02/19/2013  . Contact dermatitis and other eczema due to detergents 09/17/2012  . Anemia 09/16/2012  . Routine general medical examination at a health care facility 07/25/2012  . Neoplasm of breast, primary tumor staging category Tis: ductal carcinoma in situ (DCIS) 06/25/2012  . Unspecified vitamin D deficiency 06/09/2012  . Arthritis of both knees 06/09/2012  . Mitral valve prolapse syndrome 06/09/2012  . Wrist pain, chronic 06/09/2012  . History of Graves' disease 06/09/2012    Subjective:  CC:   Chief Complaint  Patient presents with  . Acute Visit    Patricia Crane states has a breakout bump on left gluteus maximus since christmas. Patricia Crane states when taking valtrex feels dizzy and tired. Patricia Crane requesting a refill on xanax .    HPI:   Patricia Blatchford Smithis a 43 y.o. female who presents Recurrent blistering skin lesion.  Has occurred 3 times in the past 8 months.  Always on left buttock.  Has history of herpes , contracted from husband.  Does not tolerate acyclovir 5 time daily dose due to dizziness and malaise . Requesting alternative therapy    Past Medical History  Diagnosis Date  . Chicken pox   . Shingles   . Heart murmur   . Thyroid disease   . Cancer May 2014    Breast     Past Surgical History  Procedure Laterality Date  . Lasix Bilateral        The following portions of the patient's history were reviewed and updated as appropriate: Allergies, current medications, and problem list.    Review of Systems:   12 Patricia Crane  review of systems was negative except those addressed in the HPI,     History   Social History  . Marital Status: Married    Spouse Name: N/A    Number of Children: N/A  . Years of Education: N/A   Occupational History  . Not on file.   Social History Main Topics  . Smoking status:  Former Smoker -- 2.00 packs/day    Types: Cigarettes    Quit date: 06/10/2003  . Smokeless tobacco: Never Used     Comment: social smoker  . Alcohol Use: Yes     Comment: occasionally  . Drug Use: Not on file  . Sexual Activity: Not on file   Other Topics Concern  . Not on file   Social History Narrative  . No narrative on file    Objective:  Filed Vitals:   02/18/13 1302  BP: 100/60  Pulse: 68  Temp: 98 F (36.7 C)     General appearance: alert, cooperative and appears stated age Ears: normal TM's and external ear canals both ears Throat: lips, mucosa, and tongue normal; teeth and gums normal Neck: no adenopathy, no carotid bruit, supple, symmetrical, trachea midline and thyroid not enlarged, symmetric, no tenderness/mass/nodules Back: symmetric, no curvature. ROM normal. No CVA tenderness. Lungs: clear to auscultation bilaterally Heart: regular rate and rhythm, S1, S2 normal, no murmur, click, rub or gallop Abdomen: soft, non-tender; bowel sounds normal; no masses,  no organomegaly Pulses: 2+ and symmetric Skin: herpetiform patchy rash on left buttock. nonvesicular currently Lymph nodes: Cervical, supraclavicular, and axillary nodes normal.  Assessment and Plan:  Herpes simplex infection of skin Lesions are no longer vesicular and are resolving.  Recommend  trial of abreva bid. And resume acylovir once daily dosing as suprressive therapy.  Will change to famciclovir if needed   Updated Medication List Outpatient Encounter Prescriptions as of 02/18/2013  Medication Sig  . ALPRAZolam (XANAX) 0.25 MG tablet Take 1 tablet (0.25 mg total) by mouth 2 (two) times daily as needed for sleep or anxiety.  . Cholecalciferol (D3 SUPER STRENGTH) 2000 UNITS CAPS Take by mouth daily.  Marland Kitchen exemestane (AROMASIN) 25 MG tablet Take 25 mg by mouth daily after breakfast.  . Melatonin-Pyridoxine (MELATIN) 3-1 MG TABS Take 3 mg by mouth at bedtime.  . Misc Natural Products (OSTEO BI-FLEX  ADV DOUBLE ST PO) Take by mouth.  . Multiple Vitamins-Minerals (MULTIVITAMIN WITH MINERALS) tablet Take 1 tablet by mouth daily.  . Probiotic Product (ALIGN) 4 MG CAPS Take 4 mg by mouth daily.  . Triptorelin Pamoate (TRELSTAR DEPOT IM) Inject into the muscle.  . valACYclovir (VALTREX) 500 MG tablet Take 1 tablet (500 mg total) by mouth daily.  . [DISCONTINUED] ALPRAZolam (XANAX) 0.25 MG tablet Take 1 tablet (0.25 mg total) by mouth 2 (two) times daily as needed for sleep or anxiety.  . [DISCONTINUED] Cholecalciferol (SM VITAMIN D3) 1000 UNITS tablet Take 1,000 Units by mouth daily.  . [DISCONTINUED] valACYclovir (VALTREX) 500 MG tablet Take 500 mg by mouth as needed.  Marland Kitchen azithromycin (ZITHROMAX) 500 MG tablet Take 1 tablet (500 mg total) by mouth daily.  . ciprofloxacin (CIPRO) 250 MG tablet Take 1 tablet (250 mg total) by mouth 2 (two) times daily.  . Docosanol (ABREVA) 10 % CREA Apply 5 times daily to affected area  . ergocalciferol (DRISDOL) 50000 UNITS capsule Take 1 capsule (50,000 Units total) by mouth once a week.  . Evening Primrose Oil 500 MG CAPS Take 500 mg by mouth daily.  . meloxicam (MOBIC) 15 MG tablet Take 1 tablet (15 mg total) by mouth daily.  Marland Kitchen triamcinolone (KENALOG) 0.025 % ointment Apply topically 2 (two) times daily.     No orders of the defined types were placed in this encounter.    No Follow-up on file.

## 2013-02-19 NOTE — Assessment & Plan Note (Signed)
Lesions are no longer vesicular and are resolving.  Recommend trial of abreva bid. And resume acylovir once daily dosing as suprressive therapy.  Will change to famciclovir if needed

## 2013-03-03 DIAGNOSIS — D049 Carcinoma in situ of skin, unspecified: Secondary | ICD-10-CM | POA: Insufficient documentation

## 2013-03-05 ENCOUNTER — Ambulatory Visit: Payer: Self-pay | Admitting: Oncology

## 2013-03-05 LAB — CBC CANCER CENTER
BASOS ABS: 0 x10 3/mm (ref 0.0–0.1)
BASOS PCT: 0.4 %
EOS PCT: 2 %
Eosinophil #: 0.1 x10 3/mm (ref 0.0–0.7)
HCT: 41 % (ref 35.0–47.0)
HGB: 13.7 g/dL (ref 12.0–16.0)
LYMPHS PCT: 26.2 %
Lymphocyte #: 2 x10 3/mm (ref 1.0–3.6)
MCH: 31 pg (ref 26.0–34.0)
MCHC: 33.5 g/dL (ref 32.0–36.0)
MCV: 93 fL (ref 80–100)
Monocyte #: 0.5 x10 3/mm (ref 0.2–0.9)
Monocyte %: 6.8 %
NEUTROS ABS: 4.9 x10 3/mm (ref 1.4–6.5)
Neutrophil %: 64.6 %
PLATELETS: 293 x10 3/mm (ref 150–440)
RBC: 4.43 10*6/uL (ref 3.80–5.20)
RDW: 13.3 % (ref 11.5–14.5)
WBC: 7.6 x10 3/mm (ref 3.6–11.0)

## 2013-03-05 LAB — COMPREHENSIVE METABOLIC PANEL
ALBUMIN: 3.9 g/dL (ref 3.4–5.0)
ALK PHOS: 96 U/L
ALT: 25 U/L (ref 12–78)
ANION GAP: 9 (ref 7–16)
BUN: 14 mg/dL (ref 7–18)
Bilirubin,Total: 0.2 mg/dL (ref 0.2–1.0)
CO2: 29 mmol/L (ref 21–32)
CREATININE: 0.88 mg/dL (ref 0.60–1.30)
Calcium, Total: 9.1 mg/dL (ref 8.5–10.1)
Chloride: 102 mmol/L (ref 98–107)
EGFR (African American): >60
EGFR (Non-African Amer.): >60
Glucose: 106 mg/dL — ABNORMAL HIGH (ref 65–99)
OSMOLALITY: 280 (ref 275–301)
POTASSIUM: 4 mmol/L (ref 3.5–5.1)
SGOT(AST): 16 U/L (ref 15–37)
SODIUM: 140 mmol/L (ref 136–145)
Total Protein: 8.1 g/dL (ref 6.4–8.2)

## 2013-03-22 ENCOUNTER — Ambulatory Visit: Payer: Self-pay | Admitting: Oncology

## 2013-04-21 ENCOUNTER — Ambulatory Visit: Payer: Self-pay | Admitting: Oncology

## 2013-04-22 ENCOUNTER — Ambulatory Visit: Payer: Self-pay | Admitting: Surgery

## 2013-04-22 HISTORY — PX: BREAST BIOPSY: SHX20

## 2013-04-23 ENCOUNTER — Ambulatory Visit: Payer: Self-pay | Admitting: Oncology

## 2013-04-23 LAB — PATHOLOGY REPORT

## 2013-04-24 ENCOUNTER — Other Ambulatory Visit: Payer: Self-pay | Admitting: *Deleted

## 2013-04-24 NOTE — Telephone Encounter (Signed)
Ok refill? 

## 2013-04-27 ENCOUNTER — Other Ambulatory Visit: Payer: Self-pay | Admitting: *Deleted

## 2013-04-27 MED ORDER — DOCOSANOL 10 % EX CREA: TOPICAL_CREAM | CUTANEOUS | Status: DC

## 2013-05-19 ENCOUNTER — Encounter: Payer: Self-pay | Admitting: Internal Medicine

## 2013-05-19 ENCOUNTER — Ambulatory Visit (INDEPENDENT_AMBULATORY_CARE_PROVIDER_SITE_OTHER): Payer: No Typology Code available for payment source | Admitting: Internal Medicine

## 2013-05-19 VITALS — BP 106/70 | HR 99 | Temp 98.2°F | Resp 16 | Wt 138.5 lb

## 2013-05-19 DIAGNOSIS — K5900 Constipation, unspecified: Secondary | ICD-10-CM | POA: Insufficient documentation

## 2013-05-19 DIAGNOSIS — Z1211 Encounter for screening for malignant neoplasm of colon: Secondary | ICD-10-CM

## 2013-05-19 DIAGNOSIS — C44319 Basal cell carcinoma of skin of other parts of face: Secondary | ICD-10-CM

## 2013-05-19 DIAGNOSIS — D0510 Intraductal carcinoma in situ of unspecified breast: Secondary | ICD-10-CM

## 2013-05-19 DIAGNOSIS — R141 Gas pain: Secondary | ICD-10-CM

## 2013-05-19 DIAGNOSIS — D649 Anemia, unspecified: Secondary | ICD-10-CM

## 2013-05-19 DIAGNOSIS — E559 Vitamin D deficiency, unspecified: Secondary | ICD-10-CM

## 2013-05-19 DIAGNOSIS — R143 Flatulence: Secondary | ICD-10-CM

## 2013-05-19 DIAGNOSIS — R142 Eructation: Secondary | ICD-10-CM

## 2013-05-19 DIAGNOSIS — D0590 Unspecified type of carcinoma in situ of unspecified breast: Secondary | ICD-10-CM

## 2013-05-19 HISTORY — DX: Basal cell carcinoma of skin of other parts of face: C44.319

## 2013-05-19 HISTORY — DX: Constipation, unspecified: K59.00

## 2013-05-19 LAB — VITAMIN D 25 HYDROXY (VIT D DEFICIENCY, FRACTURES): Vit D, 25-Hydroxy: 39 ng/mL (ref 30–89)

## 2013-05-19 MED ORDER — KP SILICONE SCAR THERAPY GEL STRP: ORAL_STRIP | Status: DC

## 2013-05-19 NOTE — Assessment & Plan Note (Addendum)
S/p lumpectomy and Sentinel node dissection. Patient has completed  radiation therapy  with Dr. Baruch Gouty. No recurrence by recent mammogram. Biopsy of left breast recently was benign

## 2013-05-19 NOTE — Assessment & Plan Note (Addendum)
S/p Moh's excision  January 2015.  Sun block and protective gearing advised.

## 2013-05-19 NOTE — Assessment & Plan Note (Addendum)
Resolved by repeat testing.    

## 2013-05-19 NOTE — Progress Notes (Signed)
Patient ID: Patricia Crane, female   DOB: September 10, 1970, 43 y.o.   MRN: [id number]  Patient Active Problem List   Diagnosis Date Noted  . Flatulence/wind 05/19/2013  . Unspecified constipation 05/19/2013  . Basal cell carcinoma of cheek 05/19/2013  . Herpes simplex infection of skin 02/19/2013  . Contact dermatitis and other eczema due to detergents 09/17/2012  . Anemia 09/16/2012  . Routine general medical examination at a health care facility 07/25/2012  . Neoplasm of breast, primary tumor staging category Tis: ductal carcinoma in situ (DCIS) 06/25/2012  . Unspecified vitamin D deficiency 06/09/2012  . Arthritis of both knees 06/09/2012  . Mitral valve prolapse syndrome 06/09/2012  . Wrist pain, chronic 06/09/2012  . History of Graves' disease 06/09/2012    Subjective:  CC:   Chief Complaint  Patient presents with  . Annual Exam    HPI:   Patricia Crane is a 43 y.o. female who presents for  Follow up on chronic conditions.   She had a basal cell carcinoma  removed  From her left cheek  by [facility]  On Jan 13th,  Healing very well but wants to use a silicone gel scar treatment .    Excessive gas,  Constipation for the past 2 months ,  Stools daily or every other daily .  Diet is healthy and drinsk lots of water  Using a fiber supplement Benefiber twice daily  Which has helped .  Has noted Occasional blood in stools,  Rare,  Attributed to hemorrhoids  No prior colonoscopy  No FH of colon CA .    Her surveillance mammogram was done last month and a biopsy of the right breast was done which was benign.  She had her bone density test  Last month  by Dr. Oliva Bustard.   Past Medical History  Diagnosis Date  . Chicken pox   . Shingles   . Heart murmur   . Thyroid disease   . Cancer May 2014    Breast     Past Surgical History  Procedure Laterality Date  . Lasix Bilateral        The following portions of the patient's history were reviewed and updated as appropriate:  Allergies, current medications, and problem list.    Review of Systems:   Patient denies headache, fevers, malaise, unintentional weight loss, skin rash, eye pain, sinus congestion and sinus pain, sore throat, dysphagia,  hemoptysis , cough, dyspnea, wheezing, chest pain, palpitations, orthopnea, edema, abdominal pain, nausea, melena, diarrhea, constipation, flank pain, dysuria, hematuria, urinary  Frequency, nocturia, numbness, tingling, seizures,  Focal weakness, Loss of consciousness,  Tremor, insomnia, depression, anxiety, and suicidal ideation.     History   Social History  . Marital Status: Married    Spouse Name: N/A    Number of Children: N/A  . Years of Education: N/A   Occupational History  . Not on file.   Social History Main Topics  . Smoking status: Former Smoker -- 2.00 packs/day    Types: Cigarettes    Quit date: 06/10/2003  . Smokeless tobacco: Never Used     Comment: social smoker  . Alcohol Use: Yes     Comment: occasionally  . Drug Use: Not on file  . Sexual Activity: Not on file   Other Topics Concern  . Not on file   Social History Narrative  . No narrative on file    Objective:  Filed Vitals:   05/19/13 1546  BP: 106/70  Pulse:  99  Temp: 98.2 F (36.8 C)  Resp: 16     General appearance: alert, cooperative and appears stated age Ears: normal TM's and external ear canals both ears Throat: lips, mucosa, and tongue normal; teeth and gums normal Neck: no adenopathy, no carotid bruit, supple, symmetrical, trachea midline and thyroid not enlarged, symmetric, no tenderness/mass/nodules Back: symmetric, no curvature. ROM normal. No CVA tenderness. Lungs: clear to auscultation bilaterally Heart: regular rate and rhythm, S1, S2 normal, no murmur, click, rub or gallop Abdomen: soft, non-tender; bowel sounds normal; no masses,  no organomegaly Pulses: 2+ and symmetric Skin: Skin color, texture, turgor normal. No rashes or lesions Lymph nodes:  Cervical, supraclavicular, and axillary nodes normal.  Assessment and Plan:  Unspecified vitamin D deficiency Repeat Vit D due.   Flatulence/wind Lengthy discussion of diet and constipation . Recommend use of Beano and Lactase.   Unspecified constipation Despite use of benefiber twice daily. FOBT given.    Basal cell carcinoma of cheek S/p Moh's excision  January 2015.  Sun block and protective gearing advised.    Anemia Resolved by repeat testing   Malignant neoplasm of breast, stage 2 S/p lumpectomy and Sentinel node dissection. Patient has completed  radiation therapy  with Dr. Baruch Gouty. No recurrence by recent mammogram. Biopsy of left breast recently was benign      Updated Medication List Outpatient Encounter Prescriptions as of 05/19/2013  Medication Sig  . ALPRAZolam (XANAX) 0.25 MG tablet Take 1 tablet (0.25 mg total) by mouth 2 (two) times daily as needed for sleep or anxiety.  Marland Kitchen azithromycin (ZITHROMAX) 500 MG tablet Take 1 tablet (500 mg total) by mouth daily.  . Cholecalciferol (D3 SUPER STRENGTH) 2000 UNITS CAPS Take by mouth daily.  . ciprofloxacin (CIPRO) 250 MG tablet Take 1 tablet (250 mg total) by mouth 2 (two) times daily.  . Docosanol (ABREVA) 10 % CREA Apply 5 times daily to affected area  . ergocalciferol (DRISDOL) 50000 UNITS capsule Take 1 capsule (50,000 Units total) by mouth once a week.  . Evening Primrose Oil 500 MG CAPS Take 500 mg by mouth daily.  Marland Kitchen exemestane (AROMASIN) 25 MG tablet Take 25 mg by mouth daily after breakfast.  . Lutein 10 MG TABS Take 1 tablet by mouth daily.  . Melatonin-Pyridoxine (MELATIN) 3-1 MG TABS Take 3 mg by mouth at bedtime.  . meloxicam (MOBIC) 15 MG tablet Take 1 tablet (15 mg total) by mouth daily.  . Misc Natural Products (OSTEO BI-FLEX ADV DOUBLE ST PO) Take by mouth.  . Multiple Vitamins-Minerals (MULTIVITAMIN WITH MINERALS) tablet Take 1 tablet by mouth daily.  . Occlusive Silicone Strips (KP SILICONE SCAR  THERAPY GEL) STRP Apply nightly to scar  . Probiotic Product (ALIGN) 4 MG CAPS Take 4 mg by mouth daily.  Marland Kitchen triamcinolone (KENALOG) 0.025 % ointment Apply topically 2 (two) times daily.  . Triptorelin Pamoate (TRELSTAR DEPOT IM) Inject into the muscle.  . valACYclovir (VALTREX) 500 MG tablet Take 1 tablet (500 mg total) by mouth daily.

## 2013-05-19 NOTE — Assessment & Plan Note (Signed)
Lengthy discussion of diet and constipation . Recommend use of Beano and Lactase.

## 2013-05-19 NOTE — Assessment & Plan Note (Signed)
Repeat Vit D due.

## 2013-05-19 NOTE — Assessment & Plan Note (Signed)
Despite use of benefiber twice daily. FOBT given.

## 2013-05-19 NOTE — Progress Notes (Signed)
Pre-visit discussion using our clinic review tool. No additional management support is needed unless otherwise documented below in the visit note.  

## 2013-05-19 NOTE — Patient Instructions (Signed)
Excessive  Gas is often caused by diet (even good diets cause gas!) , fiber and constipation  Try Beano drops before any meal with beans,  Including soy,  And vegetables including broccoli, cabbage, brussels sprouts and cauliflower  Try Lactase prior to any meals containing dairy   Phasyme and Gas X  Can be taken for relief of gas

## 2013-05-20 ENCOUNTER — Ambulatory Visit: Payer: Self-pay | Admitting: Oncology

## 2013-05-20 LAB — COMPREHENSIVE METABOLIC PANEL
ALBUMIN: 4.3 g/dL (ref 3.5–5.2)
ALK PHOS: 82 U/L (ref 39–117)
ALT: 17 U/L (ref 0–35)
AST: 19 U/L (ref 0–37)
BUN: 14 mg/dL (ref 6–23)
CO2: 26 mEq/L (ref 19–32)
Calcium: 9.5 mg/dL (ref 8.4–10.5)
Chloride: 104 mEq/L (ref 96–112)
Creatinine, Ser: 0.7 mg/dL (ref 0.4–1.2)
GFR: 97.88 mL/min (ref >60.00)
Glucose, Bld: 123 mg/dL — ABNORMAL HIGH (ref 70–99)
Potassium: 4.1 mEq/L (ref 3.5–5.1)
Sodium: 140 mEq/L (ref 135–145)
Total Bilirubin: 0.5 mg/dL (ref 0.3–1.2)
Total Protein: 7.3 g/dL (ref 6.0–8.3)

## 2013-05-20 LAB — TSH: TSH: 0.84 u[IU]/mL (ref 0.35–5.50)

## 2013-05-21 ENCOUNTER — Encounter: Payer: Self-pay | Admitting: Internal Medicine

## 2013-05-25 MED ORDER — KP SILICONE SCAR THERAPY GEL STRP: ORAL_STRIP | Status: DC

## 2013-06-04 LAB — COMPREHENSIVE METABOLIC PANEL
ALBUMIN: 4 g/dL (ref 3.4–5.0)
Alkaline Phosphatase: 101 U/L
Anion Gap: 8 (ref 7–16)
BILIRUBIN TOTAL: 0.3 mg/dL (ref 0.2–1.0)
BUN: 15 mg/dL (ref 7–18)
CALCIUM: 9.6 mg/dL (ref 8.5–10.1)
CHLORIDE: 103 mmol/L (ref 98–107)
CO2: 32 mmol/L (ref 21–32)
Creatinine: 0.79 mg/dL (ref 0.60–1.30)
EGFR (African American): >60
EGFR (Non-African Amer.): >60
GLUCOSE: 97 mg/dL (ref 65–99)
OSMOLALITY: 286 (ref 275–301)
POTASSIUM: 4.1 mmol/L (ref 3.5–5.1)
SGOT(AST): 14 U/L — ABNORMAL LOW (ref 15–37)
SGPT (ALT): 22 U/L (ref 12–78)
Sodium: 143 mmol/L (ref 136–145)
TOTAL PROTEIN: 8 g/dL (ref 6.4–8.2)

## 2013-06-04 LAB — CBC CANCER CENTER
BASOS ABS: 0 x10 3/mm (ref 0.0–0.1)
Basophil %: 0.7 %
Eosinophil #: 0.2 x10 3/mm (ref 0.0–0.7)
Eosinophil %: 2.9 %
HCT: 40.8 % (ref 35.0–47.0)
HGB: 13.2 g/dL (ref 12.0–16.0)
LYMPHS PCT: 29.2 %
Lymphocyte #: 1.9 x10 3/mm (ref 1.0–3.6)
MCH: 30.3 pg (ref 26.0–34.0)
MCHC: 32.4 g/dL (ref 32.0–36.0)
MCV: 93 fL (ref 80–100)
Monocyte #: 0.5 x10 3/mm (ref 0.2–0.9)
Monocyte %: 7.1 %
NEUTROS PCT: 60.1 %
Neutrophil #: 3.8 x10 3/mm (ref 1.4–6.5)
PLATELETS: 287 x10 3/mm (ref 150–440)
RBC: 4.37 10*6/uL (ref 3.80–5.20)
RDW: 13.3 % (ref 11.5–14.5)
WBC: 6.3 x10 3/mm (ref 3.6–11.0)

## 2013-06-19 ENCOUNTER — Ambulatory Visit: Payer: Self-pay | Admitting: Oncology

## 2013-08-27 ENCOUNTER — Ambulatory Visit: Payer: Self-pay | Admitting: Oncology

## 2013-08-27 LAB — CBC CANCER CENTER
BASOS ABS: 0 x10 3/mm (ref 0.0–0.1)
Basophil %: 0.5 %
EOS PCT: 3.6 %
Eosinophil #: 0.3 x10 3/mm (ref 0.0–0.7)
HCT: 40.7 % (ref 35.0–47.0)
HGB: 13.6 g/dL (ref 12.0–16.0)
LYMPHS ABS: 2 x10 3/mm (ref 1.0–3.6)
Lymphocyte %: 27.7 %
MCH: 32.4 pg (ref 26.0–34.0)
MCHC: 33.6 g/dL (ref 32.0–36.0)
MCV: 97 fL (ref 80–100)
MONO ABS: 0.4 x10 3/mm (ref 0.2–0.9)
Monocyte %: 6.1 %
Neutrophil #: 4.4 x10 3/mm (ref 1.4–6.5)
Neutrophil %: 62.1 %
Platelet: 287 x10 3/mm (ref 150–440)
RBC: 4.21 10*6/uL (ref 3.80–5.20)
RDW: 13.3 % (ref 11.5–14.5)
WBC: 7.1 x10 3/mm (ref 3.6–11.0)

## 2013-08-27 LAB — COMPREHENSIVE METABOLIC PANEL
ALBUMIN: 3.8 g/dL (ref 3.4–5.0)
ALT: 24 U/L (ref 12–78)
ANION GAP: 8 (ref 7–16)
Alkaline Phosphatase: 115 U/L
BUN: 12 mg/dL (ref 7–18)
Bilirubin,Total: 0.3 mg/dL (ref 0.2–1.0)
CALCIUM: 8.9 mg/dL (ref 8.5–10.1)
CHLORIDE: 104 mmol/L (ref 98–107)
CREATININE: 0.92 mg/dL (ref 0.60–1.30)
Co2: 28 mmol/L (ref 21–32)
EGFR (African American): >60
EGFR (Non-African Amer.): >60
Glucose: 118 mg/dL — ABNORMAL HIGH (ref 65–99)
Osmolality: 280 (ref 275–301)
Potassium: 3.9 mmol/L (ref 3.5–5.1)
SGOT(AST): 18 U/L (ref 15–37)
SODIUM: 140 mmol/L (ref 136–145)
TOTAL PROTEIN: 8 g/dL (ref 6.4–8.2)

## 2013-09-19 ENCOUNTER — Ambulatory Visit: Payer: Self-pay | Admitting: Oncology

## 2013-11-13 ENCOUNTER — Ambulatory Visit: Payer: Self-pay | Admitting: Oncology

## 2013-11-26 ENCOUNTER — Ambulatory Visit: Payer: Self-pay | Admitting: Oncology

## 2013-11-26 LAB — CBC CANCER CENTER
BASOS PCT: 0.6 %
Basophil #: 0 x10 3/mm (ref 0.0–0.1)
EOS ABS: 0.1 x10 3/mm (ref 0.0–0.7)
EOS PCT: 2.4 %
HCT: 41.2 % (ref 35.0–47.0)
HGB: 13.7 g/dL (ref 12.0–16.0)
LYMPHS ABS: 1.7 x10 3/mm (ref 1.0–3.6)
Lymphocyte %: 30.5 %
MCH: 31.4 pg (ref 26.0–34.0)
MCHC: 33.3 g/dL (ref 32.0–36.0)
MCV: 94 fL (ref 80–100)
Monocyte #: 0.4 x10 3/mm (ref 0.2–0.9)
Monocyte %: 6.9 %
Neutrophil #: 3.4 x10 3/mm (ref 1.4–6.5)
Neutrophil %: 59.6 %
Platelet: 291 x10 3/mm (ref 150–440)
RBC: 4.38 10*6/uL (ref 3.80–5.20)
RDW: 13.1 % (ref 11.5–14.5)
WBC: 5.7 x10 3/mm (ref 3.6–11.0)

## 2013-11-26 LAB — COMPREHENSIVE METABOLIC PANEL
ALK PHOS: 108 U/L
ALT: 27 U/L
ANION GAP: 5 — AB (ref 7–16)
AST: 15 U/L (ref 15–37)
Albumin: 3.9 g/dL (ref 3.4–5.0)
BILIRUBIN TOTAL: 0.2 mg/dL (ref 0.2–1.0)
BUN: 13 mg/dL (ref 7–18)
Calcium, Total: 9.2 mg/dL (ref 8.5–10.1)
Chloride: 103 mmol/L (ref 98–107)
Co2: 33 mmol/L — ABNORMAL HIGH (ref 21–32)
Creatinine: 0.79 mg/dL (ref 0.60–1.30)
EGFR (Non-African Amer.): >60
Glucose: 78 mg/dL (ref 65–99)
OSMOLALITY: 280 (ref 275–301)
Potassium: 3.9 mmol/L (ref 3.5–5.1)
Sodium: 141 mmol/L (ref 136–145)
Total Protein: 7.6 g/dL (ref 6.4–8.2)

## 2013-11-27 LAB — CANCER ANTIGEN 27.29: CA 27.29: 28.2 U/mL (ref 0.0–38.6)

## 2013-12-20 ENCOUNTER — Ambulatory Visit: Payer: Self-pay | Admitting: Oncology

## 2014-02-01 ENCOUNTER — Telehealth: Payer: Self-pay | Admitting: Internal Medicine

## 2014-02-01 NOTE — Telephone Encounter (Signed)
Having difficulty sleeping , snoring .

## 2014-02-02 ENCOUNTER — Ambulatory Visit (INDEPENDENT_AMBULATORY_CARE_PROVIDER_SITE_OTHER): Payer: No Typology Code available for payment source | Admitting: Internal Medicine

## 2014-02-02 ENCOUNTER — Encounter: Payer: Self-pay | Admitting: Internal Medicine

## 2014-02-02 VITALS — BP 110/74 | HR 98 | Temp 98.7°F | Resp 14 | Ht 60.0 in | Wt 131.2 lb

## 2014-02-02 DIAGNOSIS — I340 Nonrheumatic mitral (valve) insufficiency: Secondary | ICD-10-CM

## 2014-02-02 DIAGNOSIS — M797 Fibromyalgia: Secondary | ICD-10-CM

## 2014-02-02 DIAGNOSIS — I341 Nonrheumatic mitral (valve) prolapse: Secondary | ICD-10-CM

## 2014-02-02 DIAGNOSIS — R5383 Other fatigue: Secondary | ICD-10-CM

## 2014-02-02 DIAGNOSIS — M542 Cervicalgia: Secondary | ICD-10-CM

## 2014-02-02 DIAGNOSIS — M2550 Pain in unspecified joint: Secondary | ICD-10-CM

## 2014-02-02 DIAGNOSIS — T148 Other injury of unspecified body region: Secondary | ICD-10-CM

## 2014-02-02 DIAGNOSIS — G4710 Hypersomnia, unspecified: Secondary | ICD-10-CM

## 2014-02-02 DIAGNOSIS — M791 Myalgia: Secondary | ICD-10-CM

## 2014-02-02 DIAGNOSIS — R400 Somnolence: Secondary | ICD-10-CM

## 2014-02-02 DIAGNOSIS — T148XXA Other injury of unspecified body region, initial encounter: Secondary | ICD-10-CM

## 2014-02-02 DIAGNOSIS — M7918 Myalgia, other site: Secondary | ICD-10-CM

## 2014-02-02 DIAGNOSIS — R0683 Snoring: Secondary | ICD-10-CM

## 2014-02-02 LAB — TSH: TSH: 1.08 u[IU]/mL (ref 0.35–4.50)

## 2014-02-02 LAB — COMPREHENSIVE METABOLIC PANEL
ALT: 16 U/L (ref 0–35)
AST: 19 U/L (ref 0–37)
Albumin: 4.3 g/dL (ref 3.5–5.2)
Alkaline Phosphatase: 96 U/L (ref 39–117)
BUN: 11 mg/dL (ref 6–23)
CALCIUM: 9.5 mg/dL (ref 8.4–10.5)
CHLORIDE: 103 meq/L (ref 96–112)
CO2: 28 meq/L (ref 19–32)
CREATININE: 0.7 mg/dL (ref 0.4–1.2)
GFR: 91.43 mL/min (ref >60.00)
Glucose, Bld: 97 mg/dL (ref 70–99)
Potassium: 3.9 mEq/L (ref 3.5–5.1)
SODIUM: 137 meq/L (ref 135–145)
Total Bilirubin: 0.6 mg/dL (ref 0.2–1.2)
Total Protein: 7.6 g/dL (ref 6.0–8.3)

## 2014-02-02 LAB — CBC WITH DIFFERENTIAL/PLATELET
BASOS PCT: 0.4 % (ref 0.0–3.0)
Basophils Absolute: 0 10*3/uL (ref 0.0–0.1)
Eosinophils Absolute: 0.1 10*3/uL (ref 0.0–0.7)
Eosinophils Relative: 2.1 % (ref 0.0–5.0)
HEMATOCRIT: 40.2 % (ref 36.0–46.0)
HEMOGLOBIN: 13.5 g/dL (ref 12.0–15.0)
LYMPHS ABS: 2 10*3/uL (ref 0.7–4.0)
Lymphocytes Relative: 31.6 % (ref 12.0–46.0)
MCHC: 33.6 g/dL (ref 30.0–36.0)
MCV: 92.3 fl (ref 78.0–100.0)
MONO ABS: 0.5 10*3/uL (ref 0.1–1.0)
MONOS PCT: 7.2 % (ref 3.0–12.0)
NEUTROS ABS: 3.7 10*3/uL (ref 1.4–7.7)
Neutrophils Relative %: 58.7 % (ref 43.0–77.0)
Platelets: 317 10*3/uL (ref 150.0–400.0)
RBC: 4.36 Mil/uL (ref 3.87–5.11)
RDW: 13.3 % (ref 11.5–15.5)
WBC: 6.4 10*3/uL (ref 4.0–10.5)

## 2014-02-02 LAB — SEDIMENTATION RATE: Sed Rate: 29 mm/hr — ABNORMAL HIGH (ref 0–22)

## 2014-02-02 LAB — C-REACTIVE PROTEIN: CRP: <0.5 mg/dL (ref 0.5–20.0)

## 2014-02-02 LAB — VITAMIN D 25 HYDROXY (VIT D DEFICIENCY, FRACTURES): VITD: 40.79 ng/mL (ref 30.00–100.00)

## 2014-02-02 MED ORDER — METHOCARBAMOL 500 MG PO TABS: 500.0000 mg | ORAL_TABLET | Freq: Three times a day (TID) | ORAL | Status: DC | PRN

## 2014-02-02 NOTE — Progress Notes (Signed)
Patient ID: Patricia Crane, female   DOB: 08-Feb-1971, 43 y.o.   MRN: [id number]   Patient Active Problem List   Diagnosis Date Noted  . Snoring 02/03/2014  . Bruising 02/03/2014  . Bilateral myofascial pain 02/03/2014  . Unspecified constipation 05/19/2013  . Basal cell carcinoma of cheek 05/19/2013  . Herpes simplex infection of skin 02/19/2013  . Contact dermatitis and other eczema due to detergents 09/17/2012  . Anemia 09/16/2012  . Routine general medical examination at a health care facility 07/25/2012  . Malignant neoplasm of breast, stage 2 06/25/2012  . Unspecified vitamin D deficiency 06/09/2012  . Arthritis of both knees 06/09/2012  . Mitral valve prolapse syndrome 06/09/2012  . Wrist pain, chronic 06/09/2012  . History of Graves' disease 06/09/2012    Subjective:  CC:   Chief Complaint  Patient presents with  . Follow-up    not sleeping well muscle aches and soreness with tingling in extremities.  . Snoring    wakes up snoring 2 to 3 times per night    HPI:   Patricia Crane is a 43 y.o. female who presents for  mutiple issues, including fatigue,  Non restorative sleep. Wakes up several times per ngiht snoring,  Husband has noticed an ltered breathing pattern.  Becomes very sleepy in the afternoon. Snores if she sleeps on her back.  Sinuses become congested if she sleeps on left side.  If she sleeps on her right side,  Her shoulder aaches, and her right hand arm goes numb .  She has not had any prior cervical spine films  Muscles ache all over , has multiple trigger points on arms and thighs.   Wt loss intentional,  Achieved through reduction in snacks and portion sizes,  Wants to lose 5-8 more.  Does yoga twice a week,  Feels trieed afterwards,  Having a more difficutl time with the stretches,  And left knee  And bottom of right foot.  UREs been having increased bruising with light pressure,  Does notice some gum bleeding with flossing .     Past Medical History   Diagnosis Date  . Chicken pox   . Shingles   . Heart murmur   . Thyroid disease   . Cancer May 2014    Breast     Past Surgical History  Procedure Laterality Date  . Lasix Bilateral        The following portions of the patient's history were reviewed and updated as appropriate: Allergies, current medications, and problem list.    Review of Systems:   Patient denies headache, fevers, malaise, unintentional weight loss, skin rash, eye pain, sinus congestion and sinus pain, sore throat, dysphagia,  hemoptysis , cough, dyspnea, wheezing, chest pain, palpitations, orthopnea, edema, abdominal pain, nausea, melena, diarrhea, constipation, flank pain, dysuria, hematuria, urinary  Frequency, nocturia, numbness, tingling, seizures,  Focal weakness, Loss of consciousness,  Tremor, insomnia, depression, anxiety, and suicidal ideation.     History   Social History  . Marital Status: Married    Spouse Name: N/A    Number of Children: N/A  . Years of Education: N/A   Occupational History  . Not on file.   Social History Main Topics  . Smoking status: Former Smoker -- 2.00 packs/day    Types: Cigarettes    Quit date: 06/10/2003  . Smokeless tobacco: Never Used     Comment: social smoker  . Alcohol Use: Yes     Comment: occasionally  . Drug Use:  Not on file  . Sexual Activity: Not on file   Other Topics Concern  . Not on file   Social History Narrative    Objective:  Filed Vitals:   02/02/14 1405  BP: 110/74  Pulse: 98  Temp: 98.7 F (37.1 C)  Resp: 14     General appearance: alert, cooperative and appears stated age Ears: normal TM's and external ear canals both ears Throat: lips, mucosa, and tongue normal; teeth and gums normal Neck: no adenopathy, no carotid bruit, supple, symmetrical, trachea midline and thyroid not enlarged, symmetric, no tenderness/mass/nodules Back: symmetric, no curvature. ROM normal. No CVA tenderness. Lungs: clear to auscultation  bilaterally Heart: regular rate and rhythm, S1, S2 normal, no murmur, click, rub or gallop Abdomen: soft, non-tender; bowel sounds normal; no masses,  no organomegaly Pulses: 2+ and symmetric Skin: Skin color, texture, turgor normal. No rashes or lesions Lymph nodes: Cervical, supraclavicular, and axillary nodes normal.  Assessment and Plan:  Mitral valve prolapse syndrome With   Grade 3/6 murmurr.  ECHO ordered/  Snoring With apneic breathing, and daytime hypersomnolence reported.  Sleep study ordered  Bruising Checking cbc  Bilateral myofascial pain will rule out rheumatologic causes ,  Suspect fibromyalgIA   Updated Medication List Outpatient Encounter Prescriptions as of 02/02/2014  Medication Sig  . ALPRAZolam (XANAX) 0.25 MG tablet Take 1 tablet (0.25 mg total) by mouth 2 (two) times daily as needed for sleep or anxiety.  . Cholecalciferol (D3 SUPER STRENGTH) 2000 UNITS CAPS Take by mouth daily.  . Docosanol (ABREVA) 10 % CREA Apply 5 times daily to affected area  . Evening Primrose Oil 500 MG CAPS Take 500 mg by mouth daily.  Marland Kitchen exemestane (AROMASIN) 25 MG tablet Take 25 mg by mouth daily after breakfast.  . Lutein 10 MG TABS Take 1 tablet by mouth daily.  . Melatonin-Pyridoxine (MELATIN) 3-1 MG TABS Take 3 mg by mouth at bedtime.  . Misc Natural Products (OSTEO BI-FLEX ADV DOUBLE ST PO) Take by mouth.  . Multiple Vitamins-Minerals (MULTIVITAMIN WITH MINERALS) tablet Take 1 tablet by mouth daily.  . Occlusive Silicone Strips (KP SILICONE SCAR THERAPY GEL) STRP Apply nightly to scar  . Probiotic Product (ALIGN) 4 MG CAPS Take 4 mg by mouth daily.  . Triptorelin Pamoate (TRELSTAR DEPOT IM) Inject into the muscle.  . valACYclovir (VALTREX) 500 MG tablet Take 1 tablet (500 mg total) by mouth daily.  Marland Kitchen azithromycin (ZITHROMAX) 500 MG tablet Take 1 tablet (500 mg total) by mouth daily. (Patient not taking: Reported on 02/02/2014)  . ciprofloxacin (CIPRO) 250 MG tablet Take 1  tablet (250 mg total) by mouth 2 (two) times daily. (Patient not taking: Reported on 02/02/2014)  . ergocalciferol (DRISDOL) 50000 UNITS capsule Take 1 capsule (50,000 Units total) by mouth once a week. (Patient not taking: Reported on 02/02/2014)  . meloxicam (MOBIC) 15 MG tablet Take 1 tablet (15 mg total) by mouth daily. (Patient not taking: Reported on 02/02/2014)  . methocarbamol (ROBAXIN) 500 MG tablet Take 1 tablet (500 mg total) by mouth every 8 (eight) hours as needed for muscle spasms.  Marland Kitchen triamcinolone (KENALOG) 0.025 % ointment Apply topically 2 (two) times daily. (Patient not taking: Reported on 02/02/2014)     Orders Placed This Encounter  Procedures  . DG Cervical Spine Complete  . CBC with Differential  . Comprehensive metabolic panel  . TSH  . Vit D  25 hydroxy (rtn osteoporosis monitoring)  . C-reactive protein  . Cyclic citrul peptide antibody,  IgG  . Sedimentation rate  . Rheumatoid factor  . 2D Echocardiogram without contrast  . Nocturnal polysomnography    No Follow-up on file.

## 2014-02-02 NOTE — Progress Notes (Signed)
Pre-visit discussion using our clinic review tool. No additional management support is needed unless otherwise documented below in the visit note.  

## 2014-02-02 NOTE — Assessment & Plan Note (Addendum)
With   Grade 3/6 murmurr.  ECHO ordered/

## 2014-02-02 NOTE — Patient Instructions (Signed)
Fibromyalgia Fibromyalgia is a disorder that is often misunderstood. It is associated with muscular pains and tenderness that comes and goes. It is often associated with fatigue and sleep disturbances. Though it tends to be long-lasting, fibromyalgia is not life-threatening. CAUSES  The exact cause of fibromyalgia is unknown. People with certain gene types are predisposed to developing fibromyalgia and other conditions. Certain factors can play a role as triggers, such as:  Spine disorders.  Arthritis.  Severe injury (trauma) and other physical stressors.  Emotional stressors. SYMPTOMS   The main symptom is pain and stiffness in the muscles and joints, which can vary over time.  Sleep and fatigue problems. Other related symptoms may include:  Bowel and bladder problems.  Headaches.  Visual problems.  Problems with odors and noises.  Depression or mood changes.  Painful periods (dysmenorrhea).  Dryness of the skin or eyes. DIAGNOSIS  There are no specific tests for diagnosing fibromyalgia. Patients can be diagnosed accurately from the specific symptoms they have. The diagnosis is made by determining that nothing else is causing the problems. TREATMENT  There is no cure. Management includes medicines and an active, healthy lifestyle. The goal is to enhance physical fitness, decrease pain, and improve sleep. HOME CARE INSTRUCTIONS   Only take over-the-counter or prescription medicines as directed by your caregiver. Sleeping pills, tranquilizers, and pain medicines may make your problems worse.  Low-impact aerobic exercise is very important and advised for treatment. At first, it may seem to make pain worse. Gradually increasing your tolerance will overcome this feeling.  Learning relaxation techniques and how to control stress will help you. Biofeedback, visual imagery, hypnosis, muscle relaxation, yoga, and meditation are all options.  Anti-inflammatory medicines and  physical therapy may provide short-term help.  Acupuncture or massage treatments may help.  Take muscle relaxant medicines as suggested by your caregiver.  Avoid stressful situations.  Plan a healthy lifestyle. This includes your diet, sleep, rest, exercise, and friends.  Find and practice a hobby you enjoy.  Join a fibromyalgia support group for interaction, ideas, and sharing advice. This may be helpful. SEEK MEDICAL CARE IF:  You are not having good results or improvement from your treatment. FOR MORE INFORMATION  National Fibromyalgia Association: www.fmaware.org Arthritis Foundation: www.arthritis.org Document Released: 02/05/2005 Document Revised: 04/30/2011 Document Reviewed: 05/18/2009 Exit[employer] Patient Information 2015 [employer]. This information is not intended to replace advice given to you by your health care provider. Make sure you discuss any questions you have with your health care provider.  

## 2014-02-03 ENCOUNTER — Encounter: Payer: Self-pay | Admitting: Internal Medicine

## 2014-02-03 DIAGNOSIS — T148XXA Other injury of unspecified body region, initial encounter: Secondary | ICD-10-CM | POA: Insufficient documentation

## 2014-02-03 DIAGNOSIS — R0683 Snoring: Secondary | ICD-10-CM | POA: Insufficient documentation

## 2014-02-03 DIAGNOSIS — M7918 Myalgia, other site: Secondary | ICD-10-CM | POA: Insufficient documentation

## 2014-02-03 LAB — CYCLIC CITRUL PEPTIDE ANTIBODY, IGG: Cyclic Citrullin Peptide Ab: <2 U/mL (ref 0.0–5.0)

## 2014-02-03 LAB — RHEUMATOID FACTOR

## 2014-02-03 NOTE — Assessment & Plan Note (Signed)
Checking cbc

## 2014-02-03 NOTE — Assessment & Plan Note (Signed)
will rule out rheumatologic causes ,  Suspect fibromyalgIA

## 2014-02-03 NOTE — Assessment & Plan Note (Signed)
With apneic breathing, and daytime hypersomnolence reported.  Sleep study ordered

## 2014-02-04 ENCOUNTER — Telehealth: Payer: Self-pay | Admitting: Internal Medicine

## 2014-02-08 ENCOUNTER — Ambulatory Visit: Payer: Self-pay | Admitting: Internal Medicine

## 2014-02-10 ENCOUNTER — Other Ambulatory Visit: Payer: Self-pay | Admitting: Internal Medicine

## 2014-02-10 DIAGNOSIS — M130 Polyarthritis, unspecified: Secondary | ICD-10-CM

## 2014-02-11 ENCOUNTER — Telehealth: Payer: Self-pay | Admitting: Internal Medicine

## 2014-02-15 ENCOUNTER — Other Ambulatory Visit (INDEPENDENT_AMBULATORY_CARE_PROVIDER_SITE_OTHER): Payer: No Typology Code available for payment source

## 2014-02-15 ENCOUNTER — Other Ambulatory Visit: Payer: Self-pay

## 2014-02-15 ENCOUNTER — Other Ambulatory Visit (HOSPITAL_COMMUNITY): Payer: Self-pay | Admitting: *Deleted

## 2014-02-15 DIAGNOSIS — I341 Nonrheumatic mitral (valve) prolapse: Secondary | ICD-10-CM

## 2014-02-15 DIAGNOSIS — R011 Cardiac murmur, unspecified: Secondary | ICD-10-CM

## 2014-02-15 DIAGNOSIS — I340 Nonrheumatic mitral (valve) insufficiency: Secondary | ICD-10-CM

## 2014-02-16 ENCOUNTER — Encounter: Payer: Self-pay | Admitting: Internal Medicine

## 2014-02-24 ENCOUNTER — Ambulatory Visit: Payer: Self-pay | Admitting: Oncology

## 2014-02-24 LAB — COMPREHENSIVE METABOLIC PANEL
ALBUMIN: 4 g/dL (ref 3.4–5.0)
ALK PHOS: 120 U/L — AB
ALT: 24 U/L
Anion Gap: 9 (ref 7–16)
BILIRUBIN TOTAL: 0.3 mg/dL (ref 0.2–1.0)
BUN: 11 mg/dL (ref 7–18)
Calcium, Total: 8.9 mg/dL (ref 8.5–10.1)
Chloride: 103 mmol/L (ref 98–107)
Co2: 30 mmol/L (ref 21–32)
Creatinine: 0.8 mg/dL (ref 0.60–1.30)
EGFR (African American): >60
EGFR (Non-African Amer.): >60
Glucose: 105 mg/dL — ABNORMAL HIGH (ref 65–99)
Osmolality: 283 (ref 275–301)
Potassium: 4.1 mmol/L (ref 3.5–5.1)
SGOT(AST): 13 U/L — ABNORMAL LOW (ref 15–37)
SODIUM: 142 mmol/L (ref 136–145)
TOTAL PROTEIN: 7.9 g/dL (ref 6.4–8.2)

## 2014-02-24 LAB — CBC CANCER CENTER
Basophil #: 0 x10 3/mm (ref 0.0–0.1)
Basophil %: 0.6 %
Eosinophil #: 0.1 x10 3/mm (ref 0.0–0.7)
Eosinophil %: 2.3 %
HCT: 39.2 % (ref 35.0–47.0)
HGB: 13.3 g/dL (ref 12.0–16.0)
LYMPHS ABS: 2 x10 3/mm (ref 1.0–3.6)
Lymphocyte %: 33.2 %
MCH: 31.4 pg (ref 26.0–34.0)
MCHC: 33.9 g/dL (ref 32.0–36.0)
MCV: 93 fL (ref 80–100)
MONOS PCT: 5.8 %
Monocyte #: 0.3 x10 3/mm (ref 0.2–0.9)
Neutrophil #: 3.5 x10 3/mm (ref 1.4–6.5)
Neutrophil %: 58.1 %
PLATELETS: 310 x10 3/mm (ref 150–440)
RBC: 4.23 10*6/uL (ref 3.80–5.20)
RDW: 13 % (ref 11.5–14.5)
WBC: 6 x10 3/mm (ref 3.6–11.0)

## 2014-02-24 LAB — T4, FREE: Free Thyroxine: 1.09 ng/dL (ref 0.76–1.46)

## 2014-02-26 LAB — CANCER ANTIGEN 27.29: CA 27.29: 22.6 U/mL (ref 0.0–38.6)

## 2014-03-16 ENCOUNTER — Encounter: Payer: Self-pay | Admitting: Internal Medicine

## 2014-03-22 ENCOUNTER — Ambulatory Visit: Payer: Self-pay | Admitting: Oncology

## 2014-03-24 ENCOUNTER — Encounter: Payer: Self-pay | Admitting: Internal Medicine

## 2014-04-25 ENCOUNTER — Other Ambulatory Visit: Payer: Self-pay | Admitting: Internal Medicine

## 2014-04-26 NOTE — Telephone Encounter (Signed)
Ok to refill,  Authorized in epic and sent  

## 2014-04-26 NOTE — Telephone Encounter (Signed)
Last visit 02/02/14

## 2014-04-27 ENCOUNTER — Ambulatory Visit: Payer: Self-pay | Admitting: Oncology

## 2014-05-26 ENCOUNTER — Ambulatory Visit
Admit: 2014-05-26 | Disposition: A | Discharge status: Home / Self Care | Payer: Self-pay | Attending: Oncology | Admitting: Oncology

## 2014-05-26 LAB — COMPREHENSIVE METABOLIC PANEL
ALK PHOS: 110 U/L
Albumin: 4.3 g/dL
Anion Gap: 6 — ABNORMAL LOW (ref 7–16)
BUN: 17 mg/dL
Bilirubin,Total: 0.3 mg/dL
CALCIUM: 9.1 mg/dL
CHLORIDE: 102 mmol/L
Co2: 26 mmol/L
Creatinine: 0.77 mg/dL
EGFR (African American): >60
EGFR (Non-African Amer.): >60
Glucose: 153 mg/dL — ABNORMAL HIGH
Potassium: 3.8 mmol/L
SGOT(AST): 20 U/L
SGPT (ALT): 17 U/L
SODIUM: 134 mmol/L — AB
Total Protein: 7.6 g/dL

## 2014-05-26 LAB — CBC CANCER CENTER
Basophil #: 0 x10 3/mm (ref 0.0–0.1)
Basophil %: 0.5 %
Eosinophil #: 0.2 x10 3/mm (ref 0.0–0.7)
Eosinophil %: 2 %
HCT: 39.4 % (ref 35.0–47.0)
HGB: 13.4 g/dL (ref 12.0–16.0)
Lymphocyte #: 1.9 x10 3/mm (ref 1.0–3.6)
Lymphocyte %: 26.5 %
MCH: 31.4 pg (ref 26.0–34.0)
MCHC: 34.1 g/dL (ref 32.0–36.0)
MCV: 92 fL (ref 80–100)
MONO ABS: 0.4 x10 3/mm (ref 0.2–0.9)
MONOS PCT: 5.3 %
NEUTROS PCT: 65.7 %
Neutrophil #: 4.8 x10 3/mm (ref 1.4–6.5)
PLATELETS: 305 x10 3/mm (ref 150–440)
RBC: 4.27 10*6/uL (ref 3.80–5.20)
RDW: 13 % (ref 11.5–14.5)
WBC: 7.3 x10 3/mm (ref 3.6–11.0)

## 2014-05-27 LAB — CANCER ANTIGEN 27.29: CA 27.29: 26.9 U/mL (ref 0.0–38.6)

## 2014-06-03 ENCOUNTER — Encounter: Payer: Self-pay | Admitting: Internal Medicine

## 2014-06-03 DIAGNOSIS — M810 Age-related osteoporosis without current pathological fracture: Secondary | ICD-10-CM

## 2014-06-03 LAB — HM DEXA SCAN

## 2014-06-03 LAB — HM MAMMOGRAPHY

## 2014-06-11 NOTE — Op Note (Signed)
PATIENT NAME:  Patricia Crane, Patricia Crane [facility] MR#:  [id number] DATE OF BIRTH:  04-05-1970  DATE OF PROCEDURE:  08/18/2012  PREOPERATIVE DIAGNOSIS: Left breast carcinoma.   POSTOPERATIVE DIAGNOSIS: Left breast carcinoma.   PROCEDURE PERFORMED: Left partial mastectomy and sentinel lymph node biopsy.   SURGEON: Rodena Goldmann, MD  ASSISTANTPatience Musca, PA student  ANESTHESIA: General.   DESCRIPTION OF PROCEDURE: With the patient in the supine position after induction of appropriate general anesthesia, the patient's left breast was prepped with Betadine and draped in sterile towels. The left axilla was interrogated first. An area of radionuclide activity was identified with the Neoprobe. The incision was made in this area and extended down through the subcutaneous tissue with Bovie cautery. The axilla was entered without difficulty and lymph node identified easily. Counts were 600 plus with no background counts and injection site of over 3000. The lymph node was identified and sent for lymph node macro metastases identification. While that was being performed, the left lower outer quadrant was prepared for the surgery. An elliptical incision was made around the wire and extended up approximately from the 7 o'clock position to the 9 o'clock position. Dissection was carried down through the subcutaneous tissue with Bovie electrocautery. Dissection was taken down laterally to the chest wall then dissected more medially. The wire was identified. It was short of the end of the incision. The incision was slightly extended and a larger portion removed at approximately the 9 o'clock position. The wire was completely encompassed. The specimen was taken down to the chest wall and removed. Margin markers were placed and the area where the wire had been violated was closed with nylon to better approximate the margin. The area was copiously irrigated. A Jackson-Pratt drain was inserted without difficulty and sutured in place with 3-0 nylon. The  skin was closed with 4-0 nylon, benzoin and Steri-Strips on the breast, and 3-0 Vicryl, 4-0 Vicryl and Steri-Strips in the axilla. Sterile dressings were applied. The patient was returned to the recovery room having tolerated the procedure well. Sponge and needle counts were correct x 2 in the operating room. ____________________________ Micheline Maze, MD rle:sb D: 08/18/2012 13:11:33 ET T: 08/18/2012 13:26:58 ET JOB#: [id number]  cc: Rodena Goldmann III, MD, <Dictator> Deborra Medina, MD Rodena Goldmann MD ELECTRONICALLY SIGNED 08/22/2012 8:21

## 2014-06-11 NOTE — Consult Note (Signed)
Reason for Visit: This 44 year old Female patient presents to the clinic for initial evaluation of  breast cancer .   Referred by Dr. Oliva Bustard.  Diagnosis:  Chief Complaint/Diagnosis   44 year old female status post partial mastectomy for a stage II (T2, N0, M0) invasive mammary carcinoma ER/PR positive HER-2/neu negative by fish with Oncotype score of 24 and patient declining systemic chemotherapy  Pathology Report pathology report reviewed   Imaging Report mammograms reviewed   Referral Report clinical notes reviewed   Planned Treatment Regimen adjuvant whole breast radiation   HPI   patient is a 44 year old female who presents with an abnormal mammogram of her left breastshowing ill-defined satellite density containing microcalcifications in the medial inferior portion of left breast ultrasound confirming a solid lesion suspicious for malignancy.this was confirmed on needle biopsy. She went on to have a partial mastectomy for a 2.5 cm grade 3 invasive mammary carcinoma. Medial margin was close at less than 0.5 mm. One sentinel lymph node was negative for metastatic disease. Tumor was ER/PR positive HER-2/neu not overexpressed by fish. She was not have Oncotype DX score which was 24 although patient and husband have declined systemic chemotherapy. She is now referred to radiation oncology for consideration of treatment. She is having some stiffness of her arm secondary to surgery. She also states her breast is somewhat sore at this time. She otherwise is without complaint.  Past Hx:    Heart Murmer:   Past, Family and Social History:  Past Medical History noncontributory   Family History noncontributory   Social History noncontributory   Additional Past Medical and Surgical History accompanied by husband today   Allergies:   No Known Allergies:   Home Meds:  Home Medications: Medication Instructions Status  multivitamin 1 tab(s) orally once a day Active  Probiotic Formula -  oral capsule 1 cap(s) orally once a day Active  Osteo Bi-Flex 200 mg-250 mg oral tablet 2 tab(s) orally once a day Active  Vitamin D3 1000 intl units oral capsule 1 cap(s) orally once a day Active  vitamin B17 500 milligram(s) orally once a day Active  Xanax 0.25 mg oral tablet 1 tab(s) orally 2 times a day, As Needed - for Anxiety, Nervousness, for Inability to Sleep  Active  Norco 325 mg-5 mg oral tablet  orally 1 to 2 tabs every 4 hrs as needed for pain Active   Review of Systems:  General negative   Performance Status (ECOG) 0   Skin negative   Breast see HPI   Ophthalmologic negative   ENMT negative   Respiratory and Thorax negative   Cardiovascular negative   Gastrointestinal negative   Musculoskeletal negative   Neurological negative   Hematology/Lymphatics negative   Endocrine negative   Allergic/Immunologic negative   Nursing Notes:  Nursing Vital Signs and Chemo Nursing Nursing Notes: *CC Vital Signs Flowsheet:   29-Jul-14 08:11  Temp Temperature 98  Pulse Pulse 77  Respirations Respirations 20  SBP SBP 104  DBP DBP 64  Pain Scale (0-10)  0  Current Weight (kg) (kg) 61.3  Height (cm) centimeters 152  BSA (m2) 1.5   Physical Exam:  General/Skin/HEENT:  General normal   Skin normal   Eyes normal   ENMT normal   Head and Neck normal   Additional PE well-developed Asian female in NAD. Lungs are clear to A&P. She has a slight 1/6 systolic ejection murmur. Left breast is status post partial mastectomy with some retraction towards the scar in the  medial aspect of the breast. No dominant mass or nodularity is noted in either breast into position examined. No axillary or supraclavicular adenopathy is identified.   Breasts/Resp/CV/GI/GU:  Respiratory and Thorax normal   Cardiovascular normal   Gastrointestinal normal   Genitourinary normal   MS/Neuro/Psych/Lymph:  Musculoskeletal normal   Neurological normal   Lymphatics normal    Other Results:  Radiology Results: [place]:    30-Apr-14 15:09, US Breast Left  US Breast Left   REASON FOR EXAM:    av lt parenchymal density  COMMENTS:       PROCEDURE: [place]  - US BREAST LEFT  - Jun 18 2012  3:09PM     RESULT: Ultrasound of the left breast reveals a ill-defined approximately   1.6 cm irregular mass containing calcifications. Prominent shadowing is   present. This lesion is suspicious for malignancy and surgical evaluation   suggested.    IMPRESSION:     BI-RADS: Category 5 - Highly Suggestive of Malignancy - Appropriate   Action Should Be Taken  Thank you for the oppurtunityto contribute to the care of your patient. .        Verified By: Osa Craver, M.D., MD  LabUnknown:    30-Apr-14 14:09, Digital Additional Views Lt Breast (SCR)  PACS Image     30-Apr-14 15:09, US Breast Left  PACS [place]:    30-Apr-14 14:09, Digital Additional Views Lt Breast (SCR)  Digital Additional Views Lt Breast (SCR)   REASON FOR EXAM:    av lt parenchymal density  COMMENTS:       PROCEDURE: MAM - MAM DGTL ADD VW LT  SCR  - Jun 18 2012  2:09PM     RESULT: Additional views of the left breast reveal a ill-defined stellate   density containing microcalcifications in medial inferior portion of the   left breast. Ultrasound reveals this to be a solid lesion. This is   suspicious for malignancy and surgical evaluation suggested .    IMPRESSION:  BI-RADS: Category 5 - Highly Suggestive of Malignancy -   Appropriate Action Should Be Taken suggested.      Thank you for the oppurtunity to contribute to the care of your patient.    Verified By: Osa Craver, M.D., MD   Relevent Results:   Relevant Scans and Labs ultrasound her mammograms are reviewed.   Assessment and Plan: Impression:   pathologic stage II invasive memory carcinoma status post partial mastectomy in 44 year old female with close margins ER/PR positive HER-2/neu not overexpressed  declining systemic therapy despite Oncotype scoreof 24. Plan:   the stomach a long discussion with the patient and her husband concerning adjuvant radiation therapy. I have recommended going ahead with whole breast radiation to 5000 cGy plus boosting or scar another 1600 cGy based on the close margin. Risks and benefits of treatment including skin reaction, retraction of the breast port her scar, alteration blood counts, fatigue, all were explained in detail to the patient and her husband. They both seem to comprehend my treatment plan well. I have set her up for CT simulation later this week. Patient will also be a candidate for aromatase inhibitor after completion of radiation. I've asked the patient to start exercising arm as much as possible to try to loosen up some of the scar tissue which is probably causing some of her discomfort in her left axilla. Case was personally discussed with Dr. Oliva Bustard.  I would like to take this opportunity to  thank you for allowing me to continue to participate in this patient's care.  CC Referral:  cc: Dr. Ely,Dr. Nicole Cella   Electronic Signatures: Armstead Peaks (MD)  (Signed 29-Jul-14 11:17)  Authored: HPI, Diagnosis, Past Hx, PFSH, Allergies, Home Meds, ROS, Nursing Notes, Physical Exam, Other Results, Relevent Results, Encounter Assessment and Plan, CC Referring Physician   Last Updated: 29-Jul-14 11:17 by Armstead Peaks (MD)

## 2014-06-11 NOTE — Op Note (Signed)
PATIENT NAME:  Patricia Crane, Patricia Crane [facility] MR#:  [id number] DATE OF BIRTH:  1970/05/31  DATE OF PROCEDURE:  08/18/2012  PREOPERATIVE DIAGNOSIS: Postoperative hemorrhage.   POSTOPERATIVE DIAGNOSIS: Postoperative hemorrhage.   OPERATION: Re-exploration for bleeding and hematoma.   SURGEON: Rodena Goldmann, III, MD   ANESTHESIA: General.   OPERATIVE PROCEDURE: With the patient in the supine position after the induction of appropriate general anesthesia, including general orotracheal intubation, the patient's chest was prepped with ChloraPrep and draped with sterile towels. The previous incision was opened, and a significant amount of clot was evacuated. The drain appeared to be completely occluded. There were multiple small bleeding sites but no obvious significant bleeding area. The area was irrigated and packed, watched, observed over approximately 20 to 30 minutes with no evidence of a significant single bleeding site. Again, multiple bleeding sites were coagulated with the electrocautery. Evicel was inserted along the cut breast edge along the chest wall. A larger round drain was placed in a similar site and again the breast observed for a while. No significant bleeding was encountered. The skin was stapled to allow for more significant drain evacuation and compressive dressing applied. The patient was returned to the recovery room having tolerated the procedure well.  Sponge, needle, and instrument counts were correct x2 in the operating room.  ____________________________ Rodena Goldmann III, MD rle:cb D: 08/18/2012 17:19:44 ET T: 08/18/2012 20:10:28 ET JOB#: [id number]  cc: Rodena Goldmann III, MD, <Dictator> Deborra Medina, MD   Rodena Goldmann MD ELECTRONICALLY SIGNED 08/22/2012 8:21

## 2014-06-11 NOTE — Discharge Summary (Signed)
PATIENT NAME:  Patricia Crane [facility] MR#:  [id number] DATE OF BIRTH:  1970-11-09  DATE OF ADMISSION:  08/18/2012 DATE OF DISCHARGE:  08/19/2012  BRIEF HISTORY: Patricia Crane is a 44 year old woman with recently discovered left breast carcinoma. After appropriate clinical work-up and discussion with the oncologist, she elected to proceed with a partial mastectomy and sentinel lymph node biopsy.  The procedure was planned for June 30th. After appropriate preoperative preparation and informed consent, she underwent surgical intervention. The sentinel node biopsy demonstrated no macro metastasis, and the partial mastectomy was performed without difficulty. However, in the postoperative period, she  developed significant hematoma in the subcutaneous space where the drain had apparently occluded and did not evacuate any of the fluid in the defect. The flap did not seal. The patient was returned urgently to surgery where the hematoma was evacuated. No specific bleeding site was in identified, but there were several small bleeding areas visualized. Evicel was utilized in the wound. A drain was changed to a round larger drain. She was observed over the course of the evening. Her hemoglobins remained stable, and her breast looks good. There is no sign of any persistent swelling. The drainage dropped dramatically. She is discharged home today to be followed in the office in 2 to 3 days' time. Bathing, activity, and driving instructions were given the patient.   DISCHARGE MEDICATIONS: Include: Probiotic 1 tablet daily, Osteo Bi-Flex 2 tablets daily, Vitamin D3 1 tablet daily, Xanax 0.25 mg b.i.d. and Vicodin for pain.   DISCHARGE DIAGNOSIS: Breast carcinoma.   SURGERY: Sentinel lymph node biopsy and partial mastectomy.   COMPLICATION: Postoperative bleeding with re-exploration.  ____________________________ Rodena Goldmann III, MD rle:cb D: 08/19/2012 16:53:52 ET T: 08/19/2012 20:05:45 ET JOB#: [id number] cc: Micheline Maze, MD,  <Dictator> Deborra Medina, MD Rodena Goldmann MD ELECTRONICALLY SIGNED 08/22/2012 8:24

## 2014-06-17 ENCOUNTER — Encounter: Payer: Self-pay | Admitting: Internal Medicine

## 2014-06-17 ENCOUNTER — Other Ambulatory Visit: Payer: Self-pay | Admitting: Internal Medicine

## 2014-06-17 DIAGNOSIS — Z1211 Encounter for screening for malignant neoplasm of colon: Secondary | ICD-10-CM

## 2014-06-17 DIAGNOSIS — R0683 Snoring: Secondary | ICD-10-CM

## 2014-06-17 DIAGNOSIS — R400 Somnolence: Secondary | ICD-10-CM

## 2014-08-02 ENCOUNTER — Other Ambulatory Visit: Payer: Self-pay | Admitting: Internal Medicine

## 2014-08-02 NOTE — Telephone Encounter (Signed)
Last OV 12.15.15.  Please advise refill

## 2014-08-03 NOTE — Telephone Encounter (Signed)
Ok to refill,  Refill sent  

## 2014-08-27 ENCOUNTER — Other Ambulatory Visit: Payer: Self-pay | Admitting: *Deleted

## 2014-08-27 DIAGNOSIS — C50919 Malignant neoplasm of unspecified site of unspecified female breast: Secondary | ICD-10-CM

## 2014-09-01 ENCOUNTER — Encounter: Payer: Self-pay | Admitting: Oncology

## 2014-09-01 ENCOUNTER — Inpatient Hospital Stay: Payer: 59

## 2014-09-01 ENCOUNTER — Inpatient Hospital Stay: Payer: 59 | Attending: Oncology | Admitting: Oncology

## 2014-09-01 VITALS — BP 106/65 | HR 78 | Temp 98.2°F | Wt 134.0 lb

## 2014-09-01 DIAGNOSIS — E079 Disorder of thyroid, unspecified: Secondary | ICD-10-CM | POA: Insufficient documentation

## 2014-09-01 DIAGNOSIS — C50919 Malignant neoplasm of unspecified site of unspecified female breast: Secondary | ICD-10-CM

## 2014-09-01 DIAGNOSIS — Z87891 Personal history of nicotine dependence: Secondary | ICD-10-CM | POA: Diagnosis not present

## 2014-09-01 DIAGNOSIS — C50912 Malignant neoplasm of unspecified site of left female breast: Secondary | ICD-10-CM

## 2014-09-01 DIAGNOSIS — Z85828 Personal history of other malignant neoplasm of skin: Secondary | ICD-10-CM | POA: Insufficient documentation

## 2014-09-01 DIAGNOSIS — Z79818 Long term (current) use of other agents affecting estrogen receptors and estrogen levels: Secondary | ICD-10-CM | POA: Diagnosis not present

## 2014-09-01 DIAGNOSIS — Z79899 Other long term (current) drug therapy: Secondary | ICD-10-CM | POA: Diagnosis not present

## 2014-09-01 DIAGNOSIS — C50312 Malignant neoplasm of lower-inner quadrant of left female breast: Secondary | ICD-10-CM | POA: Insufficient documentation

## 2014-09-01 DIAGNOSIS — R011 Cardiac murmur, unspecified: Secondary | ICD-10-CM | POA: Diagnosis not present

## 2014-09-01 DIAGNOSIS — Z923 Personal history of irradiation: Secondary | ICD-10-CM | POA: Diagnosis not present

## 2014-09-01 DIAGNOSIS — Z79811 Long term (current) use of aromatase inhibitors: Secondary | ICD-10-CM | POA: Diagnosis not present

## 2014-09-01 DIAGNOSIS — Z170 Estrogen receptor positive status [ER+]: Secondary | ICD-10-CM | POA: Insufficient documentation

## 2014-09-01 LAB — CBC WITH DIFFERENTIAL/PLATELET
BASOS ABS: 0 10*3/uL (ref 0–0.1)
BASOS PCT: 1 %
Eosinophils Absolute: 0.1 10*3/uL (ref 0–0.7)
Eosinophils Relative: 2 %
HCT: 43.9 % (ref 35.0–47.0)
HEMOGLOBIN: 14.3 g/dL (ref 12.0–16.0)
LYMPHS ABS: 1.8 10*3/uL (ref 1.0–3.6)
Lymphocytes Relative: 27 %
MCH: 30.2 pg (ref 26.0–34.0)
MCHC: 32.6 g/dL (ref 32.0–36.0)
MCV: 92.5 fL (ref 80.0–100.0)
MONO ABS: 0.3 10*3/uL (ref 0.2–0.9)
MONOS PCT: 5 %
Neutro Abs: 4.5 10*3/uL (ref 1.4–6.5)
Neutrophils Relative %: 65 %
PLATELETS: 286 10*3/uL (ref 150–440)
RBC: 4.74 MIL/uL (ref 3.80–5.20)
RDW: 13.4 % (ref 11.5–14.5)
WBC: 6.8 10*3/uL (ref 3.6–11.0)

## 2014-09-01 LAB — COMPREHENSIVE METABOLIC PANEL
ALBUMIN: 4.4 g/dL (ref 3.5–5.0)
ALK PHOS: 83 U/L (ref 38–126)
ALT: 16 U/L (ref 14–54)
ANION GAP: 8 (ref 5–15)
AST: 21 U/L (ref 15–41)
BUN: 16 mg/dL (ref 6–20)
CO2: 27 mmol/L (ref 22–32)
CREATININE: 0.71 mg/dL (ref 0.44–1.00)
Calcium: 8.8 mg/dL — ABNORMAL LOW (ref 8.9–10.3)
Chloride: 103 mmol/L (ref 101–111)
GFR calc Af Amer: >60 mL/min (ref >60)
GFR calc non Af Amer: >60 mL/min (ref >60)
GLUCOSE: 146 mg/dL — AB (ref 65–99)
POTASSIUM: 3.7 mmol/L (ref 3.5–5.1)
Sodium: 138 mmol/L (ref 135–145)
Total Bilirubin: 0.5 mg/dL (ref 0.3–1.2)
Total Protein: 7.7 g/dL (ref 6.5–8.1)

## 2014-09-01 MED ORDER — ALENDRONATE SODIUM 70 MG PO TABS: 70.0000 mg | ORAL_TABLET | ORAL | Status: DC

## 2014-09-01 MED ORDER — LEUPROLIDE ACETATE (3 MONTH) 22.5 MG IM KIT
22.5000 mg | PACK | Freq: Once | INTRAMUSCULAR | Status: AC
  Administered 2014-09-01: 22.5 mg via INTRAMUSCULAR
  Filled 2014-09-01: qty 22.5

## 2014-09-01 MED ORDER — EXEMESTANE 25 MG PO TABS: 25.0000 mg | ORAL_TABLET | Freq: Every day | ORAL | Status: DC

## 2014-09-01 NOTE — Progress Notes (Signed)
Patient does not have living will.  Information given.  Smoked socially about 10 years ago.

## 2014-09-01 NOTE — Progress Notes (Signed)
[place] @ [facility] [phone]  Fax:(336) [place]: 1970/04/25  MR#: [id number]  AXK#:[id number]  Patient Care Team: [name], MD as PCP - General (Internal Medicine)  CHIEF COMPLAINT:  Chief Complaint  Patient presents with  . Follow-up    Oncology History   44 year old female status post partial mastectomy for a stage II (T2, N0, M0) invasive mammary carcinoma ER/PR positive HER-2/neu negative by fish with Oncotype score of 24 and patient declining systemic chemotherapy. left  breast  lower inner quadrant tumor 2.patient did not want chemotherapy.  Had finished radiation therapy (October, 2014) 3.  Starting anti-hormonal therapy  Trelstarand Aromasin nov 2014]. 4.abnormal right breast mammogram(April of 2015) biopsies negative  for malignancy     Malignant neoplasm of breast, stage 2   06/25/2012 Initial Diagnosis Malignant neoplasm of breast, stage 2    Basal cell carcinoma of cheek   05/19/2013 Initial Diagnosis Basal cell carcinoma of cheek    Oncology Flowsheet 09/01/2014  leuprolide (LUPRON) IM 22.5 mg    INTERVAL HISTORY:  44 year old lady who had left breast cancer is stage II disease presently on anti-hormonal therapy with Trelstar and (this switched to Lupron because of side effect of Trelstar) and Aromasin. No bony pain.  No chills.  No fever.  Getting regular mammograms done  REVIEW OF SYSTEMS:   GENERAL:  Feels good.  Active.  No fevers, sweats or weight loss. PERFORMANCE STATUS (ECOG):  0 HEENT:  No visual changes, runny nose, sore throat, mouth sores or tenderness. Lungs: No shortness of breath or cough.  No hemoptysis. Cardiac:  No chest pain, palpitations, orthopnea, or PND. GI:  No nausea, vomiting, diarrhea, constipation, melena or hematochezia. GU:  No urgency, frequency, dysuria, or hematuria. Musculoskeletal:  No back pain.  No joint pain.  No muscle tenderness. Extremities:  No pain or swelling. Skin:  No  rashes or skin changes. Neuro:  No headache, numbness or weakness, balance or coordination issues. Endocrine:  No diabetes, thyroid issues, hot flashes or night sweats. Psych:  No mood changes, depression or anxiety. Pain:  No focal pain. Review of systems:  All other systems reviewed and found to be negative. As per HPI. Otherwise, a complete review of systems is negatve.  PAST MEDICAL HISTORY: Past Medical History  Diagnosis Date  . Chicken pox   . Shingles   . Heart murmur   . Thyroid disease   . Cancer May 2014    Breast     PAST SURGICAL HISTORY: Past Surgical History  Procedure Laterality Date  . Lasix Bilateral     FAMILY HISTORY Family History  Problem Relation Age of Onset  . Hyperlipidemia Mother   . Heart disease Mother   . Dementia Mother 12    alzheimers  . Cancer Father     tonsil ca  . Hyperlipidemia Sister   . Hyperlipidemia Brother   . Heart disease Maternal Aunt   . Hyperlipidemia Maternal Aunt   . Hypertension Maternal Aunt   . Heart disease Maternal Uncle   . Hyperlipidemia Maternal Uncle   . Stroke Maternal Uncle   . Hypertension Maternal Uncle   . Cancer Paternal Aunt 47    ovarian ca  . Drug abuse Paternal Aunt   . Cancer Paternal Uncle     lung CA ,  smoker    ADVANCED DIRECTIVES:  No flowsheet data found.  HEALTH MAINTENANCE: History  Substance Use Topics  . Smoking status: Former  Smoker -- 2.00 packs/day    Types: Cigarettes    Quit date: 06/10/2003  . Smokeless tobacco: Never Used     Comment: social smoker  . Alcohol Use: Yes     Comment: occasionally      No Known Allergies  Current Outpatient Prescriptions  Medication Sig Dispense Refill  . alendronate (FOSAMAX) 70 MG/75ML solution Take 70 mg by mouth every 7 (seven) days. Take with a full glass of water on an empty stomach.    . ALPRAZolam (XANAX) 0.25 MG tablet Take 1 tablet (0.25 mg total) by mouth 2 (two) times daily as needed for sleep or anxiety. 60 tablet 2    . Calcium-Magnesium-Vitamin D (CALCIUM 500 PO) Take by mouth.    . Cholecalciferol (D3 SUPER STRENGTH) 2000 UNITS CAPS Take by mouth daily.    . Docosanol (ABREVA) 10 % CREA Apply 5 times daily to affected area 2 g 1  . exemestane (AROMASIN) 25 MG tablet Take 1 tablet (25 mg total) by mouth daily after breakfast. 30 tablet 6  . Lutein 10 MG TABS Take 1 tablet by mouth daily.    . Melatonin-Pyridoxine (MELATIN) 3-1 MG TABS Take 3 mg by mouth at bedtime.    . meloxicam (MOBIC) 15 MG tablet Take 1 tablet (15 mg total) by mouth daily. 30 tablet 3  . methocarbamol (ROBAXIN) 500 MG tablet TAKE 1 TABLET BY MOUTH EVERY 8 HOURS AS NEEDED FOR MUSCLE SPASMS 30 tablet 2  . Misc Natural Products (OSTEO BI-FLEX ADV DOUBLE ST PO) Take by mouth.    . Multiple Vitamins-Minerals (MULTIVITAMIN WITH MINERALS) tablet Take 1 tablet by mouth daily.    . Occlusive Silicone Strips (KP SILICONE SCAR THERAPY GEL) STRP Apply nightly to scar 6 strip 0  . Probiotic Product (ALIGN) 4 MG CAPS Take 4 mg by mouth daily.    Marland Kitchen triamcinolone (KENALOG) 0.025 % ointment Apply topically 2 (two) times daily. 30 g 0  . valACYclovir (VALTREX) 500 MG tablet Take 1 tablet (500 mg total) by mouth daily. 30 tablet 11  . alendronate (FOSAMAX) 70 MG tablet Take 1 tablet (70 mg total) by mouth once a week. Take with a full glass of water on an empty stomach. 30 tablet 1  . azithromycin (ZITHROMAX) 500 MG tablet Take 1 tablet (500 mg total) by mouth daily. (Patient not taking: Reported on 02/02/2014) 7 tablet 0  . calcium carbonate (OS-CAL) 600 MG TABS tablet Take 600 mg by mouth 2 (two) times daily with a meal.    . ciprofloxacin (CIPRO) 250 MG tablet Take 1 tablet (250 mg total) by mouth 2 (two) times daily. (Patient not taking: Reported on 02/02/2014) 14 tablet 0  . ergocalciferol (DRISDOL) 50000 UNITS capsule Take 1 capsule (50,000 Units total) by mouth once a week. (Patient not taking: Reported on 02/02/2014) 4 capsule 0  . Evening Primrose  Oil 500 MG CAPS Take 500 mg by mouth daily.    . Triptorelin Pamoate (TRELSTAR DEPOT IM) Inject into the muscle.     No current facility-administered medications for this visit.    OBJECTIVE:  Filed Vitals:   09/01/14 1559  BP: 106/65  Pulse: 78  Temp: 98.2 F (36.8 C)     Body mass index is 26.18 kg/(m^2).    ECOG FS:0 - Asymptomatic  PHYSICAL EXAM: GENERAL:  Well developed, well nourished, sitting comfortably in the exam room in no acute distress. MENTAL STATUS:  Alert and oriented to person, place and time. HEAD:  Normocephalic, atraumatic, face symmetric, no Cushingoid features.  RESPIRATORY:  Clear to auscultation without rales, wheezes or rhonchi. CARDIOVASCULAR:  Regular rate and rhythm without murmur, rub or gallop. BREAST:  Right breast without masses, skin changes or nipple discharge.   Left breast status post lumpectomy scar tissue.  No palpable masses.  ABDOMEN:  Soft, non-tender, with active bowel sounds, and no hepatosplenomegaly.  No masses. BACK:  No CVA tenderness.  No tenderness on percussion of the back or rib cage. SKIN:  No rashes, ulcers or lesions. EXTREMITIES: No edema, no skin discoloration or tenderness.  No palpable cords. LYMPH NODES: No palpable cervical, supraclavicular, axillary or inguinal adenopathy  NEUROLOGICAL: Unremarkable. PSYCH:  Appropriate.   LAB RESULTS:  Appointment on 09/01/2014  Component Date Value Ref Range Status  . WBC 09/01/2014 6.8  3.6 - 11.0 K/uL Final  . RBC 09/01/2014 4.74  3.80 - 5.20 MIL/uL Final  . Hemoglobin 09/01/2014 14.3  12.0 - 16.0 g/dL Final  . HCT 09/01/2014 43.9  35.0 - 47.0 % Final  . MCV 09/01/2014 92.5  80.0 - 100.0 fL Final  . MCH 09/01/2014 30.2  26.0 - 34.0 pg Final  . MCHC 09/01/2014 32.6  32.0 - 36.0 g/dL Final  . RDW 09/01/2014 13.4  11.5 - 14.5 % Final  . Platelets 09/01/2014 286  150 - 440 K/uL Final  . Neutrophils Relative % 09/01/2014 65   Final  . Neutro Abs 09/01/2014 4.5  1.4 - 6.5 K/uL  Final  . Lymphocytes Relative 09/01/2014 27   Final  . Lymphs Abs 09/01/2014 1.8  1.0 - 3.6 K/uL Final  . Monocytes Relative 09/01/2014 5   Final  . Monocytes Absolute 09/01/2014 0.3  0.2 - 0.9 K/uL Final  . Eosinophils Relative 09/01/2014 2   Final  . Eosinophils Absolute 09/01/2014 0.1  0 - 0.7 K/uL Final  . Basophils Relative 09/01/2014 1   Final  . Basophils Absolute 09/01/2014 0.0  0 - 0.1 K/uL Final  . Sodium 09/01/2014 138  135 - 145 mmol/L Final  . Potassium 09/01/2014 3.7  3.5 - 5.1 mmol/L Final  . Chloride 09/01/2014 103  101 - 111 mmol/L Final  . CO2 09/01/2014 27  22 - 32 mmol/L Final  . Glucose, Bld 09/01/2014 146* 65 - 99 mg/dL Final  . BUN 09/01/2014 16  6 - 20 mg/dL Final  . Creatinine, Ser 09/01/2014 0.71  0.44 - 1.00 mg/dL Final  . Calcium 09/01/2014 8.8* 8.9 - 10.3 mg/dL Final  . Total Protein 09/01/2014 7.7  6.5 - 8.1 g/dL Final  . Albumin 09/01/2014 4.4  3.5 - 5.0 g/dL Final  . AST 09/01/2014 21  15 - 41 U/L Final  . ALT 09/01/2014 16  14 - 54 U/L Final  . Alkaline Phosphatase 09/01/2014 83  38 - 126 U/L Final  . Total Bilirubin 09/01/2014 0.5  0.3 - 1.2 mg/dL Final  . GFR calc non Af Amer 09/01/2014 >60  >60 mL/min Final  . GFR calc Af Amer 09/01/2014 >60  >60 mL/min Final   Comment: (NOTE) The eGFR has been calculated using the CKD EPI equation. This calculation has not been validated in all clinical situations. eGFR's persistently <60 mL/min signify possible Chronic Kidney Disease.   . Anion gap 09/01/2014 8  5 - 15 Final      ASSESSMENT: Carcinoma of left breast.  Will continue Lupron (switch from the Trelstar because of local reaction) and Aromasin.  Patient is going to finish 2 years of  Trelstar therapy.  We will reassess patient menopausal status.  Continue Aromasin.   Getting regular mammograms done Patient also has osteopenia so Fosamax was started on other bone density study next year  MEDICAL DECISION MAKING:  All lab data has been  reviewed. Patient will get 22.5 mg of Lupron and continue Aromasin  Patient expressed understanding and was in agreement with this plan. She also understands that She can call clinic at any time with any questions, concerns, or complaints.    No matching staging information was found for the patient.  Forest Gleason, MD   09/01/2014 10:04 PM

## 2014-09-02 ENCOUNTER — Encounter: Payer: Self-pay | Admitting: Oncology

## 2014-09-02 LAB — CANCER ANTIGEN 27.29: CA 27.29: 26 U/mL (ref 0.0–38.6)

## 2014-09-08 ENCOUNTER — Other Ambulatory Visit: Payer: Self-pay | Admitting: *Deleted

## 2014-09-08 DIAGNOSIS — C50919 Malignant neoplasm of unspecified site of unspecified female breast: Secondary | ICD-10-CM

## 2014-09-24 ENCOUNTER — Ambulatory Visit
Admission: RE | Admit: 2014-09-24 | Discharge: 2014-09-24 | Disposition: A | Discharge status: Home / Self Care | Payer: 59 | Source: Ambulatory Visit | Attending: Oncology | Admitting: Oncology

## 2014-09-24 DIAGNOSIS — C50919 Malignant neoplasm of unspecified site of unspecified female breast: Secondary | ICD-10-CM

## 2014-09-24 MED ORDER — GADOBENATE DIMEGLUMINE 529 MG/ML IV SOLN
12.0000 mL | Freq: Once | INTRAVENOUS | Status: AC | PRN
  Administered 2014-09-24: 12 mL via INTRAVENOUS

## 2014-09-28 ENCOUNTER — Encounter: Payer: Self-pay | Admitting: Oncology

## 2014-12-01 ENCOUNTER — Inpatient Hospital Stay: Payer: 59

## 2014-12-01 ENCOUNTER — Inpatient Hospital Stay: Payer: 59 | Attending: Oncology

## 2014-12-01 ENCOUNTER — Inpatient Hospital Stay (HOSPITAL_BASED_OUTPATIENT_CLINIC_OR_DEPARTMENT_OTHER): Payer: 59 | Admitting: Oncology

## 2014-12-01 VITALS — BP 110/65 | HR 73 | Temp 98.6°F | Wt 133.6 lb

## 2014-12-01 DIAGNOSIS — Z170 Estrogen receptor positive status [ER+]: Secondary | ICD-10-CM | POA: Insufficient documentation

## 2014-12-01 DIAGNOSIS — Z79811 Long term (current) use of aromatase inhibitors: Secondary | ICD-10-CM | POA: Diagnosis not present

## 2014-12-01 DIAGNOSIS — Z801 Family history of malignant neoplasm of trachea, bronchus and lung: Secondary | ICD-10-CM

## 2014-12-01 DIAGNOSIS — C50312 Malignant neoplasm of lower-inner quadrant of left female breast: Secondary | ICD-10-CM | POA: Diagnosis not present

## 2014-12-01 DIAGNOSIS — Z79899 Other long term (current) drug therapy: Secondary | ICD-10-CM | POA: Diagnosis not present

## 2014-12-01 DIAGNOSIS — Z87891 Personal history of nicotine dependence: Secondary | ICD-10-CM

## 2014-12-01 DIAGNOSIS — E079 Disorder of thyroid, unspecified: Secondary | ICD-10-CM

## 2014-12-01 DIAGNOSIS — Z923 Personal history of irradiation: Secondary | ICD-10-CM

## 2014-12-01 DIAGNOSIS — Z809 Family history of malignant neoplasm, unspecified: Secondary | ICD-10-CM | POA: Insufficient documentation

## 2014-12-01 DIAGNOSIS — C50919 Malignant neoplasm of unspecified site of unspecified female breast: Secondary | ICD-10-CM

## 2014-12-01 DIAGNOSIS — R011 Cardiac murmur, unspecified: Secondary | ICD-10-CM | POA: Insufficient documentation

## 2014-12-01 LAB — CBC WITH DIFFERENTIAL/PLATELET
BASOS ABS: 0 10*3/uL (ref 0–0.1)
BASOS PCT: 0 %
Eosinophils Absolute: 0.1 10*3/uL (ref 0–0.7)
Eosinophils Relative: 2 %
HEMATOCRIT: 41.7 % (ref 35.0–47.0)
Hemoglobin: 14 g/dL (ref 12.0–16.0)
Lymphocytes Relative: 30 %
Lymphs Abs: 1.9 10*3/uL (ref 1.0–3.6)
MCH: 31 pg (ref 26.0–34.0)
MCHC: 33.7 g/dL (ref 32.0–36.0)
MCV: 92 fL (ref 80.0–100.0)
MONO ABS: 0.3 10*3/uL (ref 0.2–0.9)
Monocytes Relative: 5 %
Neutro Abs: 3.9 10*3/uL (ref 1.4–6.5)
Neutrophils Relative %: 63 %
Platelets: 297 10*3/uL (ref 150–440)
RBC: 4.53 MIL/uL (ref 3.80–5.20)
RDW: 13.2 % (ref 11.5–14.5)
WBC: 6.3 10*3/uL (ref 3.6–11.0)

## 2014-12-01 LAB — COMPREHENSIVE METABOLIC PANEL
ALBUMIN: 4.2 g/dL (ref 3.5–5.0)
ALK PHOS: 82 U/L (ref 38–126)
ALT: 17 U/L (ref 14–54)
ANION GAP: 6 (ref 5–15)
AST: 24 U/L (ref 15–41)
BUN: 13 mg/dL (ref 6–20)
CALCIUM: 8.4 mg/dL — AB (ref 8.9–10.3)
CO2: 27 mmol/L (ref 22–32)
Chloride: 104 mmol/L (ref 101–111)
Creatinine, Ser: 0.72 mg/dL (ref 0.44–1.00)
Glucose, Bld: 151 mg/dL — ABNORMAL HIGH (ref 65–99)
POTASSIUM: 3.5 mmol/L (ref 3.5–5.1)
Sodium: 137 mmol/L (ref 135–145)
TOTAL PROTEIN: 7.1 g/dL (ref 6.5–8.1)
Total Bilirubin: 0.5 mg/dL (ref 0.3–1.2)

## 2014-12-01 NOTE — Progress Notes (Signed)
[place] @ [facility] Telephone:(336) [phone]  Fax:(336) [place]: Jul 11, 1970  MR#: [id number]  XBD#:[id number]  Patient Care Team: [name], MD as PCP - General (Internal Medicine)  CHIEF COMPLAINT:  Chief Complaint  Patient presents with  . OTHER    Oncology History   44 year old female status post partial mastectomy for a stage II (T2, N0, M0) invasive mammary carcinoma ER/PR positive HER-2/neu negative by fish with Oncotype score of 24 and patient declining systemic chemotherapy. left  breast  lower inner quadrant tumor 2.patient did not want chemotherapy.  Had finished radiation therapy (October, 2014) 3.  Starting anti-hormonal therapy  Trelstarand Aromasin nov 2014]. 4.abnormal right breast mammogram(April of 2015) biopsies negative  for malignancy   5.  Patient has finished Trelstar (Lupron) of total two-year duration now on Aromasin menopause status being assessed October of 2016   Oncology Flowsheet 09/01/2014  leuprolide (LUPRON) IM 22.5 mg    INTERVAL HISTORY:  44 year old lady who had left breast cancer is stage II disease presently on anti-hormonal therapy with Trelstar and (this switched to Lupron because of side effect of Trelstar) and Aromasin. No bony pain.  No chills.  No fever.  Getting regular mammograms done Patient has finished order to use of Trelstar.  At MRI scan of breast reported to be negative. Menopausal status being assessed Patient does not have any menstrual cycle for 2 years   REVIEW OF SYSTEMS:   GENERAL:  Feels good.  Active.  No fevers, sweats or weight loss. PERFORMANCE STATUS (ECOG):  0 HEENT:  No visual changes, runny nose, sore throat, mouth sores or tenderness. Lungs: No shortness of breath or cough.  No hemoptysis. Cardiac:  No chest pain, palpitations, orthopnea, or PND. GI:  No nausea, vomiting, diarrhea, constipation, melena or hematochezia. GU:  No urgency, frequency, dysuria, or  hematuria. Musculoskeletal:  No back pain.  No joint pain.  No muscle tenderness. Extremities:  No pain or swelling. Skin:  No rashes or skin changes. Neuro:  No headache, numbness or weakness, balance or coordination issues. Endocrine:  No diabetes, thyroid issues, hot flashes or night sweats. Psych:  No mood changes, depression or anxiety. Pain:  No focal pain. Review of systems:  All other systems reviewed and found to be negative. As per HPI. Otherwise, a complete review of systems is negatve.  PAST MEDICAL HISTORY: Past Medical History  Diagnosis Date  . Chicken pox   . Shingles   . Heart murmur   . Thyroid disease   . Cancer May 2014    Breast     PAST SURGICAL HISTORY: Past Surgical History  Procedure Laterality Date  . Lasix Bilateral     FAMILY HISTORY Family History  Problem Relation Age of Onset  . Hyperlipidemia Mother   . Heart disease Mother   . Dementia Mother 60    alzheimers  . Cancer Father     tonsil ca  . Hyperlipidemia Sister   . Hyperlipidemia Brother   . Heart disease Maternal Aunt   . Hyperlipidemia Maternal Aunt   . Hypertension Maternal Aunt   . Heart disease Maternal Uncle   . Hyperlipidemia Maternal Uncle   . Stroke Maternal Uncle   . Hypertension Maternal Uncle   . Cancer Paternal Aunt 64    ovarian ca  . Drug abuse Paternal Aunt   . Cancer Paternal Uncle     lung CA ,  smoker    ADVANCED DIRECTIVES:  No  flowsheet data found.  HEALTH MAINTENANCE: Social History  Substance Use Topics  . Smoking status: Former Smoker -- 2.00 packs/day    Types: Cigarettes    Quit date: 06/10/2003  . Smokeless tobacco: Never Used     Comment: social smoker  . Alcohol Use: Yes     Comment: occasionally      No Known Allergies  Current Outpatient Prescriptions  Medication Sig Dispense Refill  . alendronate (FOSAMAX) 70 MG tablet Take 1 tablet (70 mg total) by mouth once a week. Take with a full glass of water on an empty stomach. 30  tablet 1  . alendronate (FOSAMAX) 70 MG/75ML solution Take 70 mg by mouth every 7 (seven) days. Take with a full glass of water on an empty stomach.    . ALPRAZolam (XANAX) 0.25 MG tablet Take 1 tablet (0.25 mg total) by mouth 2 (two) times daily as needed for sleep or anxiety. 60 tablet 2  . azithromycin (ZITHROMAX) 500 MG tablet Take 1 tablet (500 mg total) by mouth daily. 7 tablet 0  . calcium carbonate (OS-CAL) 600 MG TABS tablet Take 600 mg by mouth 2 (two) times daily with a meal.    . Calcium-Magnesium-Vitamin D (CALCIUM 500 PO) Take by mouth.    . Cholecalciferol (D3 SUPER STRENGTH) 2000 UNITS CAPS Take by mouth daily.    . ciprofloxacin (CIPRO) 250 MG tablet Take 1 tablet (250 mg total) by mouth 2 (two) times daily. 14 tablet 0  . Docosanol (ABREVA) 10 % CREA Apply 5 times daily to affected area 2 g 1  . ergocalciferol (DRISDOL) 50000 UNITS capsule Take 1 capsule (50,000 Units total) by mouth once a week. 4 capsule 0  . Evening Primrose Oil 500 MG CAPS Take 500 mg by mouth daily.    Marland Kitchen exemestane (AROMASIN) 25 MG tablet Take 1 tablet (25 mg total) by mouth daily after breakfast. 30 tablet 6  . Lutein 10 MG TABS Take 1 tablet by mouth daily.    . Melatonin-Pyridoxine (MELATIN) 3-1 MG TABS Take 3 mg by mouth at bedtime.    . meloxicam (MOBIC) 15 MG tablet Take 1 tablet (15 mg total) by mouth daily. 30 tablet 3  . methocarbamol (ROBAXIN) 500 MG tablet TAKE 1 TABLET BY MOUTH EVERY 8 HOURS AS NEEDED FOR MUSCLE SPASMS 30 tablet 2  . Misc Natural Products (OSTEO BI-FLEX ADV DOUBLE ST PO) Take by mouth.    . Multiple Vitamins-Minerals (MULTIVITAMIN WITH MINERALS) tablet Take 1 tablet by mouth daily.    . Occlusive Silicone Strips (KP SILICONE SCAR THERAPY GEL) STRP Apply nightly to scar 6 strip 0  . Probiotic Product (ALIGN) 4 MG CAPS Take 4 mg by mouth daily.    Marland Kitchen triamcinolone (KENALOG) 0.025 % ointment Apply topically 2 (two) times daily. 30 g 0  . Triptorelin Pamoate (TRELSTAR DEPOT IM)  Inject into the muscle.    . valACYclovir (VALTREX) 500 MG tablet Take 1 tablet (500 mg total) by mouth daily. 30 tablet 11   No current facility-administered medications for this visit.    OBJECTIVE:  Filed Vitals:   12/01/14 1523  BP: 110/65  Pulse: 73  Temp: 98.6 F (37 C)     Body mass index is 26.09 kg/(m^2).    ECOG FS:0 - Asymptomatic  PHYSICAL EXAM: GENERAL:  Well developed, well nourished, sitting comfortably in the exam room in no acute distress. MENTAL STATUS:  Alert and oriented to person, place and time. HEAD:    Normocephalic,  atraumatic, face symmetric, no Cushingoid features.  RESPIRATORY:  Clear to auscultation without rales, wheezes or rhonchi. CARDIOVASCULAR:  Regular rate and rhythm without murmur, rub or gallop. BREAST:  Right breast without masses, skin changes or nipple discharge.   Left breast status post lumpectomy scar tissue.  No palpable masses.  ABDOMEN:  Soft, non-tender, with active bowel sounds, and no hepatosplenomegaly.  No masses. BACK:  No CVA tenderness.  No tenderness on percussion of the back or rib cage. SKIN:  No rashes, ulcers or lesions. EXTREMITIES: No edema, no skin discoloration or tenderness.  No palpable cords. LYMPH NODES: No palpable cervical, supraclavicular, axillary or inguinal adenopathy  NEUROLOGICAL: Unremarkable. PSYCH:  Appropriate.   LAB RESULTS:  Appointment on 12/01/2014  Component Date Value Ref Range Status  . WBC 12/01/2014 6.3  3.6 - 11.0 K/uL Final  . RBC 12/01/2014 4.53  3.80 - 5.20 MIL/uL Final  . Hemoglobin 12/01/2014 14.0  12.0 - 16.0 g/dL Final  . HCT 12/01/2014 41.7  35.0 - 47.0 % Final  . MCV 12/01/2014 92.0  80.0 - 100.0 fL Final  . MCH 12/01/2014 31.0  26.0 - 34.0 pg Final  . MCHC 12/01/2014 33.7  32.0 - 36.0 g/dL Final  . RDW 12/01/2014 13.2  11.5 - 14.5 % Final  . Platelets 12/01/2014 297  150 - 440 K/uL Final  . Neutrophils Relative % 12/01/2014 63   Final  . Neutro Abs 12/01/2014 3.9  1.4 -  6.5 K/uL Final  . Lymphocytes Relative 12/01/2014 30   Final  . Lymphs Abs 12/01/2014 1.9  1.0 - 3.6 K/uL Final  . Monocytes Relative 12/01/2014 5   Final  . Monocytes Absolute 12/01/2014 0.3  0.2 - 0.9 K/uL Final  . Eosinophils Relative 12/01/2014 2   Final  . Eosinophils Absolute 12/01/2014 0.1  0 - 0.7 K/uL Final  . Basophils Relative 12/01/2014 0   Final  . Basophils Absolute 12/01/2014 0.0  0 - 0.1 K/uL Final  . Sodium 12/01/2014 137  135 - 145 mmol/L Final  . Potassium 12/01/2014 3.5  3.5 - 5.1 mmol/L Final  . Chloride 12/01/2014 104  101 - 111 mmol/L Final  . CO2 12/01/2014 27  22 - 32 mmol/L Final  . Glucose, Bld 12/01/2014 151* 65 - 99 mg/dL Final  . BUN 12/01/2014 13  6 - 20 mg/dL Final  . Creatinine, Ser 12/01/2014 0.72  0.44 - 1.00 mg/dL Final  . Calcium 12/01/2014 8.4* 8.9 - 10.3 mg/dL Final  . Total Protein 12/01/2014 7.1  6.5 - 8.1 g/dL Final  . Albumin 12/01/2014 4.2  3.5 - 5.0 g/dL Final  . AST 12/01/2014 24  15 - 41 U/L Final  . ALT 12/01/2014 17  14 - 54 U/L Final  . Alkaline Phosphatase 12/01/2014 82  38 - 126 U/L Final  . Total Bilirubin 12/01/2014 0.5  0.3 - 1.2 mg/dL Final  . GFR calc non Af Amer 12/01/2014 >60  >60 mL/min Final  . GFR calc Af Amer 12/01/2014 >60  >60 mL/min Final   Comment: (NOTE) The eGFR has been calculated using the CKD EPI equation. This calculation has not been validated in all clinical situations. eGFR's persistently <60 mL/min signify possible Chronic Kidney Disease.   . Anion gap 12/01/2014 6  5 - 15 Final      ASSESSMENT: Carcinoma of left breast.  Will continue Lupron (switch from the Trelstar because of local reaction) and Aromasin.  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  We will reassess patient menopausal status.  Continue Aromasin.  MRI scan of breast was within normal limit Trelstar and Lupron both had been discontinued as his total two-year duration has been finished. Menopausal status being  assessed.   Patient expressed understanding and was in agreement with this plan. She also understands that She can call clinic at any time with any questions, concerns, or complaints.    No matching staging information was found for the patient.  Forest Gleason, MD   12/01/2014 3:31 PM

## 2014-12-01 NOTE — Progress Notes (Signed)
Patient does not have living will.  Former smoker. 

## 2014-12-02 LAB — CANCER ANTIGEN 27.29: CA 27.29: 23.2 U/mL (ref 0.0–38.6)

## 2014-12-05 ENCOUNTER — Encounter: Payer: Self-pay | Admitting: Oncology

## 2015-01-12 ENCOUNTER — Inpatient Hospital Stay: Payer: 59 | Attending: Oncology

## 2015-01-12 DIAGNOSIS — C50919 Malignant neoplasm of unspecified site of unspecified female breast: Secondary | ICD-10-CM

## 2015-01-12 DIAGNOSIS — C50912 Malignant neoplasm of unspecified site of left female breast: Secondary | ICD-10-CM | POA: Diagnosis present

## 2015-01-12 LAB — COMPREHENSIVE METABOLIC PANEL
ALBUMIN: 4.2 g/dL (ref 3.5–5.0)
ALK PHOS: 79 U/L (ref 38–126)
ALT: 17 U/L (ref 14–54)
ANION GAP: 6 (ref 5–15)
AST: 20 U/L (ref 15–41)
BUN: 16 mg/dL (ref 6–20)
CALCIUM: 9.3 mg/dL (ref 8.9–10.3)
CO2: 29 mmol/L (ref 22–32)
Chloride: 105 mmol/L (ref 101–111)
Creatinine, Ser: 0.63 mg/dL (ref 0.44–1.00)
GFR calc Af Amer: >60 mL/min (ref >60)
GFR calc non Af Amer: >60 mL/min (ref >60)
GLUCOSE: 131 mg/dL — AB (ref 65–99)
Potassium: 4.3 mmol/L (ref 3.5–5.1)
SODIUM: 140 mmol/L (ref 135–145)
Total Bilirubin: 0.5 mg/dL (ref 0.3–1.2)
Total Protein: 7.5 g/dL (ref 6.5–8.1)

## 2015-01-12 LAB — CBC WITH DIFFERENTIAL/PLATELET
Basophils Absolute: 0 10*3/uL (ref 0–0.1)
Basophils Relative: 1 %
Eosinophils Absolute: 0.1 10*3/uL (ref 0–0.7)
Eosinophils Relative: 2 %
HCT: 41.2 % (ref 35.0–47.0)
HEMOGLOBIN: 13.9 g/dL (ref 12.0–16.0)
LYMPHS ABS: 1.6 10*3/uL (ref 1.0–3.6)
LYMPHS PCT: 28 %
MCH: 31 pg (ref 26.0–34.0)
MCHC: 33.8 g/dL (ref 32.0–36.0)
MCV: 91.7 fL (ref 80.0–100.0)
MONO ABS: 0.4 10*3/uL (ref 0.2–0.9)
MONOS PCT: 6 %
NEUTROS ABS: 3.8 10*3/uL (ref 1.4–6.5)
NEUTROS PCT: 63 %
Platelets: 282 10*3/uL (ref 150–440)
RBC: 4.5 MIL/uL (ref 3.80–5.20)
RDW: 12.9 % (ref 11.5–14.5)
WBC: 5.9 10*3/uL (ref 3.6–11.0)

## 2015-01-13 LAB — ESTRADIOL: Estradiol: <5 pg/mL

## 2015-01-13 LAB — FSH/LH
FSH: 8.2 m[IU]/mL
LH: 0.4 m[IU]/mL

## 2015-01-19 ENCOUNTER — Encounter: Payer: Self-pay | Admitting: Oncology

## 2015-03-17 ENCOUNTER — Other Ambulatory Visit: Payer: Self-pay | Admitting: Oncology

## 2015-04-04 ENCOUNTER — Ambulatory Visit: Payer: 59 | Admitting: Oncology

## 2015-04-04 ENCOUNTER — Other Ambulatory Visit: Payer: 59

## 2015-04-08 ENCOUNTER — Inpatient Hospital Stay: Payer: 59 | Attending: Oncology | Admitting: Oncology

## 2015-04-08 ENCOUNTER — Encounter: Payer: Self-pay | Admitting: Oncology

## 2015-04-08 ENCOUNTER — Inpatient Hospital Stay (HOSPITAL_BASED_OUTPATIENT_CLINIC_OR_DEPARTMENT_OTHER): Payer: 59 | Admitting: *Deleted

## 2015-04-08 VITALS — BP 117/76 | HR 64 | Temp 97.1°F | Resp 18 | Wt 134.5 lb

## 2015-04-08 DIAGNOSIS — Z79811 Long term (current) use of aromatase inhibitors: Secondary | ICD-10-CM | POA: Diagnosis not present

## 2015-04-08 DIAGNOSIS — Z87891 Personal history of nicotine dependence: Secondary | ICD-10-CM | POA: Insufficient documentation

## 2015-04-08 DIAGNOSIS — Z801 Family history of malignant neoplasm of trachea, bronchus and lung: Secondary | ICD-10-CM | POA: Insufficient documentation

## 2015-04-08 DIAGNOSIS — Z170 Estrogen receptor positive status [ER+]: Secondary | ICD-10-CM | POA: Insufficient documentation

## 2015-04-08 DIAGNOSIS — K5900 Constipation, unspecified: Secondary | ICD-10-CM | POA: Insufficient documentation

## 2015-04-08 DIAGNOSIS — R011 Cardiac murmur, unspecified: Secondary | ICD-10-CM | POA: Diagnosis not present

## 2015-04-08 DIAGNOSIS — M2550 Pain in unspecified joint: Secondary | ICD-10-CM | POA: Diagnosis not present

## 2015-04-08 DIAGNOSIS — E079 Disorder of thyroid, unspecified: Secondary | ICD-10-CM | POA: Insufficient documentation

## 2015-04-08 DIAGNOSIS — Z79899 Other long term (current) drug therapy: Secondary | ICD-10-CM | POA: Diagnosis not present

## 2015-04-08 DIAGNOSIS — C50912 Malignant neoplasm of unspecified site of left female breast: Secondary | ICD-10-CM | POA: Insufficient documentation

## 2015-04-08 DIAGNOSIS — Z923 Personal history of irradiation: Secondary | ICD-10-CM | POA: Diagnosis not present

## 2015-04-08 DIAGNOSIS — Z8041 Family history of malignant neoplasm of ovary: Secondary | ICD-10-CM | POA: Insufficient documentation

## 2015-04-08 DIAGNOSIS — Z9221 Personal history of antineoplastic chemotherapy: Secondary | ICD-10-CM | POA: Insufficient documentation

## 2015-04-08 DIAGNOSIS — C50919 Malignant neoplasm of unspecified site of unspecified female breast: Secondary | ICD-10-CM

## 2015-04-08 LAB — COMPREHENSIVE METABOLIC PANEL
ALBUMIN: 4.7 g/dL (ref 3.5–5.0)
ALK PHOS: 74 U/L (ref 38–126)
ALT: 18 U/L (ref 14–54)
AST: 19 U/L (ref 15–41)
Anion gap: 7 (ref 5–15)
BUN: 15 mg/dL (ref 6–20)
CALCIUM: 9.5 mg/dL (ref 8.9–10.3)
CHLORIDE: 101 mmol/L (ref 101–111)
CO2: 28 mmol/L (ref 22–32)
CREATININE: 0.74 mg/dL (ref 0.44–1.00)
GFR calc Af Amer: >60 mL/min (ref >60)
GFR calc non Af Amer: >60 mL/min (ref >60)
GLUCOSE: 118 mg/dL — AB (ref 65–99)
Potassium: 3.7 mmol/L (ref 3.5–5.1)
SODIUM: 136 mmol/L (ref 135–145)
Total Bilirubin: 0.5 mg/dL (ref 0.3–1.2)
Total Protein: 8.3 g/dL — ABNORMAL HIGH (ref 6.5–8.1)

## 2015-04-08 LAB — CBC WITH DIFFERENTIAL/PLATELET
BASOS ABS: 0 10*3/uL (ref 0–0.1)
BASOS PCT: 1 %
EOS ABS: 0.1 10*3/uL (ref 0–0.7)
Eosinophils Relative: 2 %
HCT: 41.1 % (ref 35.0–47.0)
HEMOGLOBIN: 14.1 g/dL (ref 12.0–16.0)
Lymphocytes Relative: 34 %
Lymphs Abs: 2.1 10*3/uL (ref 1.0–3.6)
MCH: 31.3 pg (ref 26.0–34.0)
MCHC: 34.2 g/dL (ref 32.0–36.0)
MCV: 91.3 fL (ref 80.0–100.0)
MONOS PCT: 6 %
Monocytes Absolute: 0.4 10*3/uL (ref 0.2–0.9)
NEUTROS PCT: 57 %
Neutro Abs: 3.4 10*3/uL (ref 1.4–6.5)
Platelets: 272 10*3/uL (ref 150–440)
RBC: 4.5 MIL/uL (ref 3.80–5.20)
RDW: 13 % (ref 11.5–14.5)
WBC: 6.1 10*3/uL (ref 3.6–11.0)

## 2015-04-08 NOTE — Progress Notes (Signed)
Patient states she stays tired.  Also not sleeping well at night.

## 2015-04-08 NOTE — Progress Notes (Signed)
[place] @ [facility] Telephone:(336) [phone]  Fax:(336) [place]: 17-May-1970  MR#: [id number]  IEP#:[id number]  Patient Care Team: [name], MD as PCP - General (Internal Medicine)  CHIEF COMPLAINT:  Chief Complaint  Patient presents with  . Breast Cancer    Oncology History   45 year old female status post partial mastectomy for a stage II (T2, N0, M0) invasive mammary carcinoma ER/PR positive HER-2/neu negative by fish with Oncotype score of 24 and patient declining systemic chemotherapy. left  breast  lower inner quadrant tumor 2.patient did not want chemotherapy.  Had finished radiation therapy (October, 2014) 3.  Starting anti-hormonal therapy  Trelstarand Aromasin nov 2014]. 4.abnormal right breast mammogram(April of 2015) biopsies negative  for malignancy   5.  Patient has finished Trelstar (Lupron) of total two-year duration now on Aromasin menopause status being assessed October of 2016 Patient is continuing Aromasin.     INTERVAL HISTORY:  45 year old lady who had left breast cancer is stage II disease presently on anti-hormonal therapy with Trelstar and (this switched to Lupron because of side effect of Trelstar) and Aromasin. No bony pain.  No chills.  No fever.  Getting regular mammograms done Patient has finished order to use of Trelstar.  At MRI scan of breast reported to be negative. Menopausal status being assessed Patient does not have any menstrual cycle for 2 years Patient had MRI scan in August.  Which has been reported to be benign no evidence of malignancy.  Here for further follow-up taking Aromasin.  In occasional joint pains and constipation  REVIEW OF SYSTEMS:   GENERAL:  Feels good.  Active.  No fevers, sweats or weight loss. PERFORMANCE STATUS (ECOG):  0 HEENT:  No visual changes, runny nose, sore throat, mouth sores or tenderness. Lungs: No shortness of breath or cough.  No hemoptysis. Cardiac:  No chest pain,  palpitations, orthopnea, or PND. GI:  No nausea, vomiting, diarrhea, constipation, melena or hematochezia. GU:  No urgency, frequency, dysuria, or hematuria. Musculoskeletal:  No back pain.  No joint pain.  No muscle tenderness. Extremities:  No pain or swelling. Skin:  No rashes or skin changes. Neuro:  No headache, numbness or weakness, balance or coordination issues. Endocrine:  No diabetes, thyroid issues, hot flashes or night sweats. Psych:  No mood changes, depression or anxiety. Pain:  No focal pain. Review of systems:  All other systems reviewed and found to be negative. As per HPI. Otherwise, a complete review of systems is negatve.  PAST MEDICAL HISTORY: Past Medical History  Diagnosis Date  . Chicken pox   . Shingles   . Heart murmur   . Thyroid disease   . Cancer [facility]) May 2014    Breast     PAST SURGICAL HISTORY: Past Surgical History  Procedure Laterality Date  . Lasix Bilateral     FAMILY HISTORY Family History  Problem Relation Age of Onset  . Hyperlipidemia Mother   . Heart disease Mother   . Dementia Mother 9    alzheimers  . Cancer Father     tonsil ca  . Hyperlipidemia Sister   . Hyperlipidemia Brother   . Heart disease Maternal Aunt   . Hyperlipidemia Maternal Aunt   . Hypertension Maternal Aunt   . Heart disease Maternal Uncle   . Hyperlipidemia Maternal Uncle   . Stroke Maternal Uncle   . Hypertension Maternal Uncle   . Cancer Paternal Aunt 45    ovarian ca  .  Drug abuse Paternal Aunt   . Cancer Paternal Uncle     lung CA ,  smoker    ADVANCED DIRECTIVES:  No flowsheet data found.  HEALTH MAINTENANCE: Social History  Substance Use Topics  . Smoking status: Former Smoker -- 2.00 packs/day    Types: Cigarettes    Quit date: 06/10/2003  . Smokeless tobacco: Never Used     Comment: social smoker  . Alcohol Use: Yes     Comment: occasionally      No Known Allergies  Current Outpatient Prescriptions  Medication Sig Dispense  Refill  . alendronate (FOSAMAX) 70 MG tablet Take 1 tablet (70 mg total) by mouth once a week. Take with a full glass of water on an empty stomach. 30 tablet 1  . alendronate (FOSAMAX) 70 MG/75ML solution Take 70 mg by mouth every 7 (seven) days. Take with a full glass of water on an empty stomach.    . ALPRAZolam (XANAX) 0.25 MG tablet Take 1 tablet (0.25 mg total) by mouth 2 (two) times daily as needed for sleep or anxiety. 60 tablet 2  . azithromycin (ZITHROMAX) 500 MG tablet Take 1 tablet (500 mg total) by mouth daily. 7 tablet 0  . calcium carbonate (OS-CAL) 600 MG TABS tablet Take 600 mg by mouth 2 (two) times daily with a meal.    . Calcium-Magnesium-Vitamin D (CALCIUM 500 PO) Take by mouth.    . Cholecalciferol (D3 SUPER STRENGTH) 2000 UNITS CAPS Take by mouth daily.    . ciprofloxacin (CIPRO) 250 MG tablet Take 1 tablet (250 mg total) by mouth 2 (two) times daily. 14 tablet 0  . Docosanol (ABREVA) 10 % CREA Apply 5 times daily to affected area 2 g 1  . ergocalciferol (DRISDOL) 50000 UNITS capsule Take 1 capsule (50,000 Units total) by mouth once a week. 4 capsule 0  . Evening Primrose Oil 500 MG CAPS Take 500 mg by mouth daily.    Marland Kitchen exemestane (AROMASIN) 25 MG tablet TAKE 1 TABLET BY MOUTH DAILY AFTER BREAKFAST. 30 tablet 6  . Lutein 10 MG TABS Take 1 tablet by mouth daily.    . Melatonin-Pyridoxine (MELATIN) 3-1 MG TABS Take 3 mg by mouth at bedtime.    . meloxicam (MOBIC) 15 MG tablet Take 1 tablet (15 mg total) by mouth daily. 30 tablet 3  . methocarbamol (ROBAXIN) 500 MG tablet TAKE 1 TABLET BY MOUTH EVERY 8 HOURS AS NEEDED FOR MUSCLE SPASMS 30 tablet 2  . Misc Natural Products (OSTEO BI-FLEX ADV DOUBLE ST PO) Take by mouth.    . Multiple Vitamins-Minerals (MULTIVITAMIN WITH MINERALS) tablet Take 1 tablet by mouth daily.    . Occlusive Silicone Strips (KP SILICONE SCAR THERAPY GEL) STRP Apply nightly to scar 6 strip 0  . Probiotic Product (ALIGN) 4 MG CAPS Take 4 mg by mouth daily.     Marland Kitchen triamcinolone (KENALOG) 0.025 % ointment Apply topically 2 (two) times daily. 30 g 0  . Triptorelin Pamoate (TRELSTAR DEPOT IM) Inject into the muscle.    . valACYclovir (VALTREX) 500 MG tablet Take 1 tablet (500 mg total) by mouth daily. 30 tablet 11   No current facility-administered medications for this visit.    OBJECTIVE:  Filed Vitals:   04/08/15 1433  BP: 117/76  Pulse: 64  Temp: 97.1 F (36.2 C)  Resp: 18     Body mass index is 26.26 kg/(m^2).    ECOG FS:0 - Asymptomatic  PHYSICAL EXAM: GENERAL:  Well developed, well nourished,  sitting comfortably in the exam room in no acute distress. MENTAL STATUS:  Alert and oriented to person, place and time. HEAD:    Normocephalic, atraumatic, face symmetric, no Cushingoid features.  RESPIRATORY:  Clear to auscultation without rales, wheezes or rhonchi. CARDIOVASCULAR:  Regular rate and rhythm without murmur, rub or gallop. BREAST:  Right breast without masses, skin changes or nipple discharge.   Left breast status post lumpectomy scar tissue.  No palpable masses.  ABDOMEN:  Soft, non-tender, with active bowel sounds, and no hepatosplenomegaly.  No masses. BACK:  No CVA tenderness.  No tenderness on percussion of the back or rib cage. SKIN:  No rashes, ulcers or lesions. EXTREMITIES: No edema, no skin discoloration or tenderness.  No palpable cords. LYMPH NODES: No palpable cervical, supraclavicular, axillary or inguinal adenopathy  NEUROLOGICAL: Unremarkable. PSYCH:  Appropriate.   LAB RESULTS:  Clinical Support on 04/08/2015  Component Date Value Ref Range Status  . WBC 04/08/2015 6.1  3.6 - 11.0 K/uL Final  . RBC 04/08/2015 4.50  3.80 - 5.20 MIL/uL Final  . Hemoglobin 04/08/2015 14.1  12.0 - 16.0 g/dL Final  . HCT 04/08/2015 41.1  35.0 - 47.0 % Final  . MCV 04/08/2015 91.3  80.0 - 100.0 fL Final  . MCH 04/08/2015 31.3  26.0 - 34.0 pg Final  . MCHC 04/08/2015 34.2  32.0 - 36.0 g/dL Final  . RDW 04/08/2015 13.0  11.5 -  14.5 % Final  . Platelets 04/08/2015 272  150 - 440 K/uL Final  . Neutrophils Relative % 04/08/2015 57   Final  . Neutro Abs 04/08/2015 3.4  1.4 - 6.5 K/uL Final  . Lymphocytes Relative 04/08/2015 34   Final  . Lymphs Abs 04/08/2015 2.1  1.0 - 3.6 K/uL Final  . Monocytes Relative 04/08/2015 6   Final  . Monocytes Absolute 04/08/2015 0.4  0.2 - 0.9 K/uL Final  . Eosinophils Relative 04/08/2015 2   Final  . Eosinophils Absolute 04/08/2015 0.1  0 - 0.7 K/uL Final  . Basophils Relative 04/08/2015 1   Final  . Basophils Absolute 04/08/2015 0.0  0 - 0.1 K/uL Final      ASSESSMENT: Carcinoma of left breast on clinical examination there is no evidence of recurrent or progressive disease.  MRI scan of breast was within normal limit Continue Aromasin  Estradiol level was less than 5. Bilateral diagnostic mammogram in March and if needed repeat MRI scan in August  or September  Vitamin  D level is pending All of the lab data has been reviewed independently        Patient expressed understanding and was in agreement with this plan. She also understands that She can call clinic at any time with any questions, concerns, or complaints.    No matching staging information was found for the patient.  Forest Gleason, MD   04/08/2015 2:34 PM

## 2015-04-09 LAB — VITAMIN D 25 HYDROXY (VIT D DEFICIENCY, FRACTURES): VIT D 25 HYDROXY: 40 ng/mL (ref 30.0–100.0)

## 2015-04-29 ENCOUNTER — Encounter: Payer: Self-pay | Admitting: Oncology

## 2015-05-02 ENCOUNTER — Ambulatory Visit
Admission: RE | Admit: 2015-05-02 | Discharge: 2015-05-02 | Disposition: A | Discharge status: Home / Self Care | Payer: 59 | Source: Ambulatory Visit | Attending: Oncology | Admitting: Oncology

## 2015-05-02 ENCOUNTER — Other Ambulatory Visit: Payer: Self-pay | Admitting: Oncology

## 2015-05-02 DIAGNOSIS — C50919 Malignant neoplasm of unspecified site of unspecified female breast: Secondary | ICD-10-CM

## 2015-05-02 DIAGNOSIS — M8588 Other specified disorders of bone density and structure, other site: Secondary | ICD-10-CM | POA: Diagnosis not present

## 2015-05-02 DIAGNOSIS — M810 Age-related osteoporosis without current pathological fracture: Secondary | ICD-10-CM | POA: Insufficient documentation

## 2015-05-02 HISTORY — DX: Malignant neoplasm of unspecified site of unspecified female breast: C50.919

## 2015-05-03 ENCOUNTER — Encounter: Payer: Self-pay | Admitting: Internal Medicine

## 2015-05-04 MED ORDER — VALACYCLOVIR HCL 500 MG PO TABS: 500.0000 mg | ORAL_TABLET | Freq: Every day | ORAL | Status: DC

## 2015-05-04 MED ORDER — DOCOSANOL 10 % EX CREA: TOPICAL_CREAM | CUTANEOUS | Status: DC

## 2015-05-06 ENCOUNTER — Other Ambulatory Visit: Payer: Self-pay | Admitting: Internal Medicine

## 2015-09-08 ENCOUNTER — Other Ambulatory Visit: Payer: Self-pay | Admitting: *Deleted

## 2015-09-08 DIAGNOSIS — C50919 Malignant neoplasm of unspecified site of unspecified female breast: Secondary | ICD-10-CM

## 2015-09-08 MED ORDER — ALENDRONATE SODIUM 70 MG PO TABS: 70.0000 mg | ORAL_TABLET | ORAL | Status: DC

## 2015-10-05 ENCOUNTER — Other Ambulatory Visit: Payer: Self-pay

## 2015-10-05 DIAGNOSIS — C50919 Malignant neoplasm of unspecified site of unspecified female breast: Secondary | ICD-10-CM

## 2015-10-06 ENCOUNTER — Other Ambulatory Visit: Payer: Self-pay | Admitting: Hematology and Oncology

## 2015-10-06 ENCOUNTER — Inpatient Hospital Stay: Payer: 59 | Attending: Hematology and Oncology

## 2015-10-06 ENCOUNTER — Inpatient Hospital Stay (HOSPITAL_BASED_OUTPATIENT_CLINIC_OR_DEPARTMENT_OTHER): Payer: 59 | Admitting: Hematology and Oncology

## 2015-10-06 VITALS — BP 112/71 | HR 71 | Temp 97.7°F | Wt 134.9 lb

## 2015-10-06 DIAGNOSIS — C50919 Malignant neoplasm of unspecified site of unspecified female breast: Secondary | ICD-10-CM

## 2015-10-06 DIAGNOSIS — Z79811 Long term (current) use of aromatase inhibitors: Secondary | ICD-10-CM | POA: Diagnosis not present

## 2015-10-06 DIAGNOSIS — E079 Disorder of thyroid, unspecified: Secondary | ICD-10-CM

## 2015-10-06 DIAGNOSIS — Z808 Family history of malignant neoplasm of other organs or systems: Secondary | ICD-10-CM | POA: Insufficient documentation

## 2015-10-06 DIAGNOSIS — Z801 Family history of malignant neoplasm of trachea, bronchus and lung: Secondary | ICD-10-CM

## 2015-10-06 DIAGNOSIS — Z9221 Personal history of antineoplastic chemotherapy: Secondary | ICD-10-CM | POA: Diagnosis not present

## 2015-10-06 DIAGNOSIS — Z79899 Other long term (current) drug therapy: Secondary | ICD-10-CM | POA: Insufficient documentation

## 2015-10-06 DIAGNOSIS — Z170 Estrogen receptor positive status [ER+]: Secondary | ICD-10-CM

## 2015-10-06 DIAGNOSIS — Z923 Personal history of irradiation: Secondary | ICD-10-CM

## 2015-10-06 DIAGNOSIS — M818 Other osteoporosis without current pathological fracture: Secondary | ICD-10-CM | POA: Diagnosis not present

## 2015-10-06 DIAGNOSIS — Z8041 Family history of malignant neoplasm of ovary: Secondary | ICD-10-CM | POA: Diagnosis not present

## 2015-10-06 DIAGNOSIS — Z87891 Personal history of nicotine dependence: Secondary | ICD-10-CM | POA: Insufficient documentation

## 2015-10-06 DIAGNOSIS — C50912 Malignant neoplasm of unspecified site of left female breast: Secondary | ICD-10-CM

## 2015-10-06 DIAGNOSIS — R011 Cardiac murmur, unspecified: Secondary | ICD-10-CM | POA: Diagnosis not present

## 2015-10-06 LAB — COMPREHENSIVE METABOLIC PANEL
ALT: 20 U/L (ref 14–54)
AST: 21 U/L (ref 15–41)
Albumin: 4.6 g/dL (ref 3.5–5.0)
Alkaline Phosphatase: 72 U/L (ref 38–126)
Anion gap: 7 (ref 5–15)
BUN: 13 mg/dL (ref 6–20)
CO2: 27 mmol/L (ref 22–32)
Calcium: 9.3 mg/dL (ref 8.9–10.3)
Chloride: 104 mmol/L (ref 101–111)
Creatinine, Ser: 0.87 mg/dL (ref 0.44–1.00)
GFR calc Af Amer: >60 mL/min (ref >60)
GFR calc non Af Amer: >60 mL/min (ref >60)
Glucose, Bld: 105 mg/dL — ABNORMAL HIGH (ref 65–99)
Potassium: 4 mmol/L (ref 3.5–5.1)
Sodium: 138 mmol/L (ref 135–145)
Total Bilirubin: 0.5 mg/dL (ref 0.3–1.2)
Total Protein: 7.9 g/dL (ref 6.5–8.1)

## 2015-10-06 LAB — CBC WITH DIFFERENTIAL/PLATELET
Basophils Absolute: 0 10*3/uL (ref 0–0.1)
Basophils Relative: 1 %
Eosinophils Absolute: 0.1 10*3/uL (ref 0–0.7)
Eosinophils Relative: 2 %
HCT: 39.9 % (ref 35.0–47.0)
Hemoglobin: 13.6 g/dL (ref 12.0–16.0)
Lymphocytes Relative: 31 %
Lymphs Abs: 2.1 10*3/uL (ref 1.0–3.6)
MCH: 30.8 pg (ref 26.0–34.0)
MCHC: 34.1 g/dL (ref 32.0–36.0)
MCV: 90.6 fL (ref 80.0–100.0)
Monocytes Absolute: 0.4 10*3/uL (ref 0.2–0.9)
Monocytes Relative: 6 %
Neutro Abs: 4.1 10*3/uL (ref 1.4–6.5)
Neutrophils Relative %: 60 %
Platelets: 295 10*3/uL (ref 150–440)
RBC: 4.41 MIL/uL (ref 3.80–5.20)
RDW: 13 % (ref 11.5–14.5)
WBC: 6.7 10*3/uL (ref 3.6–11.0)

## 2015-10-06 NOTE — Progress Notes (Signed)
[facility] day:  10/06/15  Chief Complaint: Patricia Crane is a 45 y.o. female with stage IIA left breast cancer who is seen for reassessment.  HPI: The patient was diagnosed with left breast cancer in 2014.  She describes feeling a pressure like sensation in her left breast on the flight back from [place].  She presented with a mass in the left lower quadrant of the left breast.  She underwent left partial mastectomy and sentinel lymph node biopsy by Dr. Pat Patrick on 08/18/2012.  Pathology revealed a 2.5 cm grade III invasive ductal carcinoma of the breast.  Margins were uninvolved, but close (5 mm).  DCIS was present.  One sentinel lymph node was negative.  Tumor was ER + (60%), PR + (25%) and Her2/neu 2+ (negative by FISH).  Pathologic stage was T2N0M0.    Oncotype DX score was 24 which translates to a 10% year risk of distant recurrence 16% with tamoxifen alone (confidence interval 12-19%).  She declined systemic chemotherapy.  She received radiation.  She was started on Trelstar [facility] agonist) and Aromasin in 12/2012.  She states that she had a reaction which include inflammation at the injection site with swelling and fever. She was then switched to Lupron which was "not as bad".  She described few joint aches and headache. Last injection was in 08/2014.  She received Aromasin for 2 years. She has not had a menses for 2 years.  Estradiol was < 5 and FSH 8.2 on 01/12/2015.  She has an abnormal right mammogram on 05/2013.  Biopsy was negative.  Breast MRI on 09/24/2014 revealed expected post-operative changes in the left breast and no evidence of malignancy.  Bilateral mammogram on 05/02/2015 revealed no evidence of malignancy.  She states that annual breast MRI was recommended per radiology.  Bone density study on 05/02/2015 revealed osteoporosis with a T-score of -2.8 in the AP spine.  She is on Fosamax, calcium, and vitamin D.  The patient was last  seen by Dr. Oliva Bustard on 04/08/2015.  At that time, she was doing well.  Symptomatically, she feels pretty good. She denies any menses.   Past Medical History:  Diagnosis Date  . Breast cancer ([place]) 2014   Left  . Cancer [facility] May 2014   Breast  left- radiation  . Chicken pox   . Heart murmur   . Shingles   . Thyroid disease     Past Surgical History:  Procedure Laterality Date  . BREAST BIOPSY Left 2014   +  . BREAST BIOPSY Right    neg- core  . lasix Bilateral     Family History  Problem Relation Age of Onset  . Hyperlipidemia Mother   . Heart disease Mother   . Dementia Mother 84    alzheimers  . Cancer Father     tonsil ca  . Hyperlipidemia Sister   . Hyperlipidemia Brother   . Heart disease Maternal Aunt   . Hyperlipidemia Maternal Aunt   . Hypertension Maternal Aunt   . Heart disease Maternal Uncle   . Hyperlipidemia Maternal Uncle   . Stroke Maternal Uncle   . Hypertension Maternal Uncle   . Cancer Paternal Aunt 63    ovarian ca  . Drug abuse Paternal Aunt   . Cancer Paternal Uncle     lung CA ,  smoker  . Breast cancer Neg Hx     Social History:  reports that she quit smoking about 12  years ago. Her smoking use included Cigarettes. She smoked 2.00 packs per day. She has never used smokeless tobacco. She reports that she drinks alcohol. Her drug history is not on file.  She is from [place].  She works in the [employer].  The patient is alone today.  Allergies: No Known Allergies  Current Medications: Current Outpatient Prescriptions  Medication Sig Dispense Refill  . alendronate (FOSAMAX) 70 MG tablet Take 1 tablet (70 mg total) by mouth once a week. Take with a full glass of water on an empty stomach. 30 tablet 1  . ALPRAZolam (XANAX) 0.25 MG tablet Take 1 tablet (0.25 mg total) by mouth 2 (two) times daily as needed for sleep or anxiety. 60 tablet 2  . calcium carbonate (OS-CAL) 600 MG TABS tablet Take 600 mg by mouth 2 (two) times daily  with a meal.    . Cholecalciferol (D3 SUPER STRENGTH) 2000 UNITS CAPS Take by mouth daily.    . Docosanol (ABREVA) 10 % CREA Apply 5 times daily to affected area 2 g 1  . Lutein 10 MG TABS Take 1 tablet by mouth daily.    . Melatonin-Pyridoxine (MELATIN) 3-1 MG TABS Take 3 mg by mouth at bedtime.    . methocarbamol (ROBAXIN) 500 MG tablet     . Misc Natural Products (OSTEO BI-FLEX ADV DOUBLE ST PO) Take by mouth.    . Multiple Vitamins-Minerals (MULTIVITAMIN WITH MINERALS) tablet Take 1 tablet by mouth daily.    . Occlusive Silicone Strips (KP SILICONE SCAR THERAPY GEL) STRP Apply nightly to scar 6 strip 0  . Probiotic Product (ALIGN) 4 MG CAPS Take 4 mg by mouth daily.    . valACYclovir (VALTREX) 500 MG tablet Take 1 tablet (500 mg total) by mouth daily. 30 tablet 11  . exemestane (AROMASIN) 25 MG tablet TAKE 1 TABLET BY MOUTH DAILY AFTER BREAKFAST. 30 tablet 6   No current facility-administered medications for this visit.     Review of Systems:  GENERAL:  Feels good.  Active.  No fevers, sweats or weight loss. PERFORMANCE STATUS (ECOG):  0 HEENT:  No visual changes, runny nose, sore throat, mouth sores or tenderness. Lungs: No shortness of breath or cough.  No hemoptysis. Cardiac:  No chest pain, palpitations, orthopnea, or PND. GI:  No nausea, vomiting, diarrhea, constipation, melena or hematochezia. GU:  No urgency, frequency, dysuria, or hematuria. Musculoskeletal:  No back pain.  No joint pain.  No muscle tenderness. Extremities:  No pain or swelling. Skin:  No rashes or skin changes. Neuro:  No headache, numbness or weakness, balance or coordination issues. Endocrine:  No diabetes, thyroid issues, hot flashes or night sweats. Psych:  No mood changes, depression or anxiety. Pain:  No focal pain. Review of systems:  All other systems reviewed and found to be negative.  Physical Exam: Blood pressure 112/71, pulse 71, temperature 97.7 F (36.5 C), temperature source Tympanic,  weight 134 lb 14.7 oz (61.2 kg). GENERAL:  Well developed, well nourished, woman sitting comfortably in the exam room in no acute distress. MENTAL STATUS:  Alert and oriented to person, place and time. HEAD:  Long dark hair.  Normocephalic, atraumatic, face symmetric, no Cushingoid features. EYES:  Brown eyes.  Pupils equal round and reactive to light and accomodation.  No conjunctivitis or scleral icterus. ENT:  Oropharynx clear without lesion.  Tongue normal. Mucous membranes moist.  RESPIRATORY:  Clear to auscultation without rales, wheezes or rhonchi. CARDIOVASCULAR:  Regular rate and rhythm without  murmur, rub or gallop. BREAST:  Right breast without masses, skin changes or nipple discharge.  Post-operative changes left breast.  Left breast without masses, skin changes or nipple discharge.  ABDOMEN:  Soft, non-tender, with active bowel sounds, and no hepatosplenomegaly.  No masses. SKIN:  No rashes, ulcers or lesions. EXTREMITIES: No edema, no skin discoloration or tenderness.  No palpable cords. LYMPH NODES: No palpable cervical, supraclavicular, axillary or inguinal adenopathy  NEUROLOGICAL: Unremarkable. PSYCH:  Appropriate.   Appointment on 10/06/2015  Component Date Value Ref Range Status  . WBC 10/06/2015 6.7  3.6 - 11.0 K/uL Final  . RBC 10/06/2015 4.41  3.80 - 5.20 MIL/uL Final  . Hemoglobin 10/06/2015 13.6  12.0 - 16.0 g/dL Final  . HCT 28/67/5198 39.9  35.0 - 47.0 % Final  . MCV 10/06/2015 90.6  80.0 - 100.0 fL Final  . MCH 10/06/2015 30.8  26.0 - 34.0 pg Final  . MCHC 10/06/2015 34.1  32.0 - 36.0 g/dL Final  . RDW 24/29/9806 13.0  11.5 - 14.5 % Final  . Platelets 10/06/2015 295  150 - 440 K/uL Final  . Neutrophils Relative % 10/06/2015 60  % Final  . Neutro Abs 10/06/2015 4.1  1.4 - 6.5 K/uL Final  . Lymphocytes Relative 10/06/2015 31  % Final  . Lymphs Abs 10/06/2015 2.1  1.0 - 3.6 K/uL Final  . Monocytes Relative 10/06/2015 6  % Final  . Monocytes Absolute  10/06/2015 0.4  0.2 - 0.9 K/uL Final  . Eosinophils Relative 10/06/2015 2  % Final  . Eosinophils Absolute 10/06/2015 0.1  0 - 0.7 K/uL Final  . Basophils Relative 10/06/2015 1  % Final  . Basophils Absolute 10/06/2015 0.0  0 - 0.1 K/uL Final  . Sodium 10/06/2015 138  135 - 145 mmol/L Final  . Potassium 10/06/2015 4.0  3.5 - 5.1 mmol/L Final  . Chloride 10/06/2015 104  101 - 111 mmol/L Final  . CO2 10/06/2015 27  22 - 32 mmol/L Final  . Glucose, Bld 10/06/2015 105* 65 - 99 mg/dL Final  . BUN 99/96/7227 13  6 - 20 mg/dL Final  . Creatinine, Ser 10/06/2015 0.87  0.44 - 1.00 mg/dL Final  . Calcium 73/75/0510 9.3  8.9 - 10.3 mg/dL Final  . Total Protein 10/06/2015 7.9  6.5 - 8.1 g/dL Final  . Albumin 71/25/2479 4.6  3.5 - 5.0 g/dL Final  . AST 98/00/1239 21  15 - 41 U/L Final  . ALT 10/06/2015 20  14 - 54 U/L Final  . Alkaline Phosphatase 10/06/2015 72  38 - 126 U/L Final  . Total Bilirubin 10/06/2015 0.5  0.3 - 1.2 mg/dL Final  . GFR calc non Af Amer 10/06/2015 >60  >60 mL/min Final  . GFR calc Af Amer 10/06/2015 >60  >60 mL/min Final   Comment: (NOTE) The eGFR has been calculated using the CKD EPI equation. This calculation has not been validated in all clinical situations. eGFR's persistently <60 mL/min signify possible Chronic Kidney Disease.   . Anion gap 10/06/2015 7  5 - 15 Final    Assessment:  Patricia Crane is a 45 y.o. female stage IIA left breast cancer s/p partial mastectomy and sentinel lymph node biopsy on 08/18/2012.  Pathology revealed a 2.5 cm grade III invasive ductal carcinoma of the breast.  Margins were uninvolved, but close (5 mm).  DCIS was present.  One sentinel lymph node was negative.  Tumor was ER + (60%), PR + (25%) and Her2/neu 2+ (negative  by [place]).  Pathologic stage was T2N0M0.    Oncotype DX score was 24 which translated to a 16% risk of distant recurrence at 10 years with tamoxifen alone (confidence interval 12-19%).  She declined systemic  chemotherapy.  She received radiation.  She was started on Trelstar [facility] agonist) and Aromasin in 12/2012.  She had a reaction (inflammation at the injection site, swelling and fever). She was switched to Lupron (few joint aches and headache).  Last Lupron injection was in 08/2014.  She remains on Aromasin.   She has an abnormal right mammogram on 05/2013.  Biopsy was negative.  Breast MRI on 09/24/2014 revealed expected post-operative changes in the left breast and no evidence of malignancy.  Bilateral mammogram on 05/02/2015 revealed no evidence of malignancy.  She states that annual breast MRI was recommended per radiology.  CA 27.29 was 28.2 on 11/26/2013, 23.2 on 12/01/2014, and 27.6 on 10/06/2015.    She has been in menopause.  She has not had a menses for 2 years.  Estradiol was < 5 and FSH 8.2 on 01/12/2015.  Bone density study on 05/02/2015 revealed osteoporosis with a T-score of -2.8 in the AP spine.  She is on Fosamax, calcium, and vitamin D.  Symptomatically, she feels well.  She denies any breast concerns.  She has had no menses.  Plan: 1.  Discuss entire medical history, diagnosis and management of breast cancer. Discuss patient's age and initial treatment with Lupron and Aromasin. She has been off Lupron since 08/2014. Concern is raised for returning to a premenopausal state. FSH and estradiol will be monitored closely.  Aromasin is ineffective in the premenopausal women.  If she is premenopausal, she will need to be on tamoxifen.  If she has return of menses or premenopausal estrogen levels, she would need to be back on Lupron or goserelin (Zoladex), another [place] agonist. 2.  Labs today:  CBC with diff, CMP, CA27.29, estradiol, FSH/LH. 3.  Consider annual breast MRI. 4.  Bilateral diagnostic mammogram 05/01/2016 5.  RTC in 6 months for MD assessment and labs (CBC with diff, CMP, CA27.29, FSH, estradiol).   Lequita Asal, MD  10/06/2015, 3:06 PM

## 2015-10-07 LAB — ESTRADIOL: Estradiol: 7.6 pg/mL

## 2015-10-07 LAB — CANCER ANTIGEN 27.29: CA 27.29: 27.6 U/mL (ref 0.0–38.6)

## 2015-10-07 LAB — FSH/LH
FSH: 20.2 m[IU]/mL
LH: 7.5 m[IU]/mL

## 2015-10-08 ENCOUNTER — Other Ambulatory Visit: Payer: Self-pay | Admitting: Oncology

## 2015-10-09 ENCOUNTER — Other Ambulatory Visit: Payer: Self-pay | Admitting: *Deleted

## 2015-10-09 DIAGNOSIS — C50912 Malignant neoplasm of unspecified site of left female breast: Secondary | ICD-10-CM

## 2015-10-10 ENCOUNTER — Other Ambulatory Visit: Payer: Self-pay | Admitting: *Deleted

## 2015-10-10 DIAGNOSIS — C50912 Malignant neoplasm of unspecified site of left female breast: Secondary | ICD-10-CM

## 2015-10-14 ENCOUNTER — Encounter: Payer: Self-pay | Admitting: Hematology and Oncology

## 2015-10-18 ENCOUNTER — Encounter: Payer: Self-pay | Admitting: Internal Medicine

## 2015-10-20 ENCOUNTER — Encounter: Payer: Self-pay | Admitting: Internal Medicine

## 2015-10-20 ENCOUNTER — Other Ambulatory Visit: Payer: Self-pay | Admitting: Internal Medicine

## 2015-10-20 MED ORDER — CIPROFLOXACIN HCL 500 MG PO TABS: 500.0000 mg | ORAL_TABLET | Freq: Two times a day (BID) | ORAL | 0 refills | Status: DC

## 2015-10-24 ENCOUNTER — Encounter: Payer: Self-pay | Admitting: Hematology and Oncology

## 2015-10-24 ENCOUNTER — Other Ambulatory Visit: Payer: Self-pay | Admitting: Hematology and Oncology

## 2015-10-24 DIAGNOSIS — C50912 Malignant neoplasm of unspecified site of left female breast: Secondary | ICD-10-CM

## 2015-12-12 ENCOUNTER — Encounter: Payer: Self-pay | Admitting: Hematology and Oncology

## 2015-12-25 NOTE — Progress Notes (Signed)
[facility] day:  12/26/15  Chief Complaint: Patricia Crane is a 45 y.o. female with stage IIA left breast cancer who is seen for sick call visit assessment.  HPI: The patient was last seen in the medical oncology clinic on 11/06/2015.  At that time, she was seen for initial assessment.  She denied any complaints.  Exam was unremarkable.  She was on Aromasin.  She was on calcium, vitamin D, ad Fosamax for osteoporosis.   Labs included a normal CBC with diff, CMP, and CA27.29 (27.6).  FSH was 20.2, LH 7.5, and estradiol 7.6.  Symptomatically, she has noted a spot  In her axillae which is unusual.  She has just returned from a trip to [place] in [place].  The area itches sometimes. She also notes that her axillae is swollen.     Past Medical History:  Diagnosis Date  . Breast cancer ([place]) 2014   Left  . Cancer [facility] May 2014   Breast  left- radiation  . Chicken pox   . Heart murmur   . Shingles   . Thyroid disease     Past Surgical History:  Procedure Laterality Date  . BREAST BIOPSY Left 2014   +  . BREAST BIOPSY Right    neg- core  . lasix Bilateral     Family History  Problem Relation Age of Onset  . Hyperlipidemia Mother   . Heart disease Mother   . Dementia Mother 61    alzheimers  . Cancer Father     tonsil ca  . Hyperlipidemia Sister   . Hyperlipidemia Brother   . Heart disease Maternal Aunt   . Hyperlipidemia Maternal Aunt   . Hypertension Maternal Aunt   . Heart disease Maternal Uncle   . Hyperlipidemia Maternal Uncle   . Stroke Maternal Uncle   . Hypertension Maternal Uncle   . Cancer Paternal Aunt 24    ovarian ca  . Drug abuse Paternal Aunt   . Cancer Paternal Uncle     lung CA ,  smoker  . Breast cancer Neg Hx     Social History:  reports that she quit smoking about 12 years ago. Her smoking use included Cigarettes. She smoked 2.00 packs per day. She has never used smokeless tobacco. She reports that she  drinks alcohol. Her drug history is not on file.  She is from [place].  She has just returned from a trip to [place] and [place].  She works in the [employer].  The patient is alone today.  Allergies: No Known Allergies  Current Medications: Current Outpatient Prescriptions  Medication Sig Dispense Refill  . alendronate (FOSAMAX) 70 MG tablet Take 1 tablet (70 mg total) by mouth once a week. Take with a full glass of water on an empty stomach. 30 tablet 1  . ALPRAZolam (XANAX) 0.25 MG tablet Take 1 tablet (0.25 mg total) by mouth 2 (two) times daily as needed for sleep or anxiety. 60 tablet 2  . calcium carbonate (OS-CAL) 600 MG TABS tablet Take 600 mg by mouth 2 (two) times daily with a meal.    . Cholecalciferol (D3 SUPER STRENGTH) 2000 UNITS CAPS Take by mouth daily.    . Docosanol (ABREVA) 10 % CREA Apply 5 times daily to affected area 2 g 1  . exemestane (AROMASIN) 25 MG tablet TAKE 1 TABLET BY MOUTH DAILY AFTER BREAKFAST. 30 tablet 6  . Lutein 10 MG TABS Take 1 tablet  by mouth daily.    . Melatonin-Pyridoxine (MELATIN) 3-1 MG TABS Take 3 mg by mouth at bedtime.    . methocarbamol (ROBAXIN) 500 MG tablet     . Misc Natural Products (OSTEO BI-FLEX ADV DOUBLE ST PO) Take by mouth.    . Multiple Vitamins-Minerals (MULTIVITAMIN WITH MINERALS) tablet Take 1 tablet by mouth daily.    . Occlusive Silicone Strips (KP SILICONE SCAR THERAPY GEL) STRP Apply nightly to scar 6 strip 0  . Probiotic Product (ALIGN) 4 MG CAPS Take 4 mg by mouth daily.    . valACYclovir (VALTREX) 500 MG tablet Take 1 tablet (500 mg total) by mouth daily. 30 tablet 11   No current facility-administered medications for this visit.     Review of Systems:  GENERAL:  Feels good.  No fevers or sweats.  Weight stable. PERFORMANCE STATUS (ECOG):  0 HEENT:  No visual changes, runny nose, sore throat, mouth sores or tenderness. Lungs: No shortness of breath or cough.  No hemoptysis. Cardiac:  No chest pain,  palpitations, orthopnea, or PND. GI:  No nausea, vomiting, diarrhea, constipation, melena or hematochezia. GU:  No urgency, frequency, dysuria, or hematuria. Musculoskeletal:  No back pain.  No joint pain.  No muscle tenderness. Extremities:  No pain or swelling. Skin:  Spot on armpit. Neuro:  No headache, numbness or weakness, balance or coordination issues. Endocrine:  No diabetes, thyroid issues, hot flashes or night sweats. Psych:  No mood changes, depression or anxiety. Pain:  No focal pain. Review of systems:  All other systems reviewed and found to be negative.  Physical Exam: Blood pressure 120/72, pulse 76, temperature 98.7 F (37.1 C), temperature source Tympanic, resp. rate 18, weight 134 lb 14.7 oz (61.2 kg). GENERAL:  Well developed, well nourished, woman sitting comfortably in the exam room in no acute distress. MENTAL STATUS:  Alert and oriented to person, place and time. HEAD:  Long dark hair.  Normocephalic, atraumatic, face symmetric, no Cushingoid features. EYES:  Brown eyes.  Pupils equal round and reactive to light and accomodation.  No conjunctivitis or scleral icterus. ENT:  Oropharynx clear without lesion.  Tongue normal. Mucous membranes moist.  RESPIRATORY:  Clear to auscultation without rales, wheezes or rhonchi. CARDIOVASCULAR:  Regular rate and rhythm without murmur, rub or gallop.  ABDOMEN:  Soft, non-tender, with active bowel sounds, and no hepatosplenomegaly.  No masses. SKIN:  Changes on skin due to cupping.  3 cm oval area with rolled up edges suggestive of tinea corporis (ring worm).  EXTREMITIES: No edema, no skin discoloration or tenderness.  No palpable cords. LYMPH NODES: No palpable cervical, supraclavicular, axillary or inguinal adenopathy  NEUROLOGICAL: Unremarkable. PSYCH:  Appropriate.   No visits with results within 3 Day(s) from this visit.  Latest known visit with results is:  Appointment on 10/06/2015  Component Date Value Ref Range  Status  . WBC 10/06/2015 6.7  3.6 - 11.0 K/uL Final  . RBC 10/06/2015 4.41  3.80 - 5.20 MIL/uL Final  . Hemoglobin 10/06/2015 13.6  12.0 - 16.0 g/dL Final  . HCT 10/06/2015 39.9  35.0 - 47.0 % Final  . MCV 10/06/2015 90.6  80.0 - 100.0 fL Final  . MCH 10/06/2015 30.8  26.0 - 34.0 pg Final  . MCHC 10/06/2015 34.1  32.0 - 36.0 g/dL Final  . RDW 10/06/2015 13.0  11.5 - 14.5 % Final  . Platelets 10/06/2015 295  150 - 440 K/uL Final  . Neutrophils Relative % 10/06/2015 60  %  Final  . Neutro Abs 10/06/2015 4.1  1.4 - 6.5 K/uL Final  . Lymphocytes Relative 10/06/2015 31  % Final  . Lymphs Abs 10/06/2015 2.1  1.0 - 3.6 K/uL Final  . Monocytes Relative 10/06/2015 6  % Final  . Monocytes Absolute 10/06/2015 0.4  0.2 - 0.9 K/uL Final  . Eosinophils Relative 10/06/2015 2  % Final  . Eosinophils Absolute 10/06/2015 0.1  0 - 0.7 K/uL Final  . Basophils Relative 10/06/2015 1  % Final  . Basophils Absolute 10/06/2015 0.0  0 - 0.1 K/uL Final  . Sodium 10/06/2015 138  135 - 145 mmol/L Final  . Potassium 10/06/2015 4.0  3.5 - 5.1 mmol/L Final  . Chloride 10/06/2015 104  101 - 111 mmol/L Final  . CO2 10/06/2015 27  22 - 32 mmol/L Final  . Glucose, Bld 10/06/2015 105* 65 - 99 mg/dL Final  . BUN 10/06/2015 13  6 - 20 mg/dL Final  . Creatinine, Ser 10/06/2015 0.87  0.44 - 1.00 mg/dL Final  . Calcium 10/06/2015 9.3  8.9 - 10.3 mg/dL Final  . Total Protein 10/06/2015 7.9  6.5 - 8.1 g/dL Final  . Albumin 10/06/2015 4.6  3.5 - 5.0 g/dL Final  . AST 10/06/2015 21  15 - 41 U/L Final  . ALT 10/06/2015 20  14 - 54 U/L Final  . Alkaline Phosphatase 10/06/2015 72  38 - 126 U/L Final  . Total Bilirubin 10/06/2015 0.5  0.3 - 1.2 mg/dL Final  . GFR calc non Af Amer 10/06/2015 >60  >60 mL/min Final  . GFR calc Af Amer 10/06/2015 >60  >60 mL/min Final   Comment: (NOTE) The eGFR has been calculated using the CKD EPI equation. This calculation has not been validated in all clinical situations. eGFR's persistently <60  mL/min signify possible Chronic Kidney Disease.   . Anion gap 10/06/2015 7  5 - 15 Final  . LH 10/07/2015 7.5  mIU/mL Final   Comment: (NOTE)                    Adult Female:                      Follicular phase      2.4 -  12.6                      Ovulation phase      14.0 -  95.6                      Luteal phase          1.0 -  11.4                      Postmenopausal        7.7 -  58.5   . [facility] 10/07/2015 20.2  mIU/mL Final   Comment: (NOTE)                    Adult Female:                      Follicular phase      3.5 -  12.5                      Ovulation phase       4.7 -  21.5  Luteal phase          1.7 -   7.7                      Postmenopausal       25.8 - 134.8 Performed At: [facility] [place], [place] [id number] Lindon Romp MD IT:[id number]   . Estradiol 10/07/2015 7.6  pg/mL Final   Comment: (NOTE)                    Adult Female:                      Follicular phase   83.0 -   166.0                      Ovulation phase    85.8 -   498.0                      Luteal phase       43.8 -   211.0                      Postmenopausal     <6.0 -    54.7                    Pregnancy                      1st trimester     215.0 - >4300.0                    Girls (1-10 years)    6.0 -    27.0 Roche ECLIA methodology Performed At: [facility] [place], [place] [id number] Lindon Romp MD PJ:[id number]   . CA 27.29 10/07/2015 27.6  0.0 - 38.6 U/mL Final   Comment: (NOTE) [employer]/ACS methodology Performed At: [facility] [address] [place], [place] [id number] Lindon Romp MD NO:[id number]     Assessment:  Patricia Crane is a 45 y.o. female stage IIA left breast cancer s/p partial mastectomy and sentinel lymph node biopsy on 08/18/2012.  Pathology revealed a 2.5 cm grade III invasive ductal carcinoma of the breast.  Margins were uninvolved, but close (5 mm).  DCIS was  present.  One sentinel lymph node was negative.  Tumor was ER + (60%), PR + (25%) and Her2/neu 2+ (negative by FISH).  Pathologic stage was T2N0M0.    Oncotype DX score was 24 which translated to a 16% risk of distant recurrence at 10 years with tamoxifen alone (confidence interval 12-19%).  She declined systemic chemotherapy.  She received radiation.  She was started on Trelstar [facility] agonist) and Aromasin in 12/2012.  She had a reaction (inflammation at the injection site, swelling and fever). She was switched to Lupron (few joint aches and headache).  Last Lupron injection was in 08/2014.  She remains on Aromasin.   She has an abnormal right mammogram on 05/2013.  Biopsy was negative.  Breast MRI on 09/24/2014 revealed expected post-operative changes in the left breast and no evidence of malignancy.  Bilateral mammogram on 05/02/2015 revealed no evidence of malignancy.  She states that annual breast MRI was recommended per radiology.  CA 27.29 was 28.2 on 11/26/2013, 23.2 on 12/01/2014, and 27.6 on 10/06/2015.    She has been in menopause.  She has not had a menses for 2 years.  Estradiol was < 5 and FSH 8.2 on 01/12/2015.  Bone density study on 05/02/2015 revealed osteoporosis with a T-score of -2.8 in the AP spine.  She is on Fosamax, calcium, and vitamin D.  Symptomatically, she feels well.  She has recently traveled to [place] and [place].  She has a skin lesion c/w tinea corporis (ringworm).  Plan: 1.  Patient to make appt with dermatology. 2.  Reassurance provided. 3.  RTC as previously scheduled.   Lequita Asal, MD  12/26/2015, 10:49 AM

## 2015-12-26 ENCOUNTER — Inpatient Hospital Stay: Payer: 59 | Attending: Hematology and Oncology | Admitting: Hematology and Oncology

## 2015-12-26 VITALS — BP 120/72 | HR 76 | Temp 98.7°F | Resp 18 | Wt 134.9 lb

## 2015-12-26 DIAGNOSIS — Z8041 Family history of malignant neoplasm of ovary: Secondary | ICD-10-CM

## 2015-12-26 DIAGNOSIS — Z79811 Long term (current) use of aromatase inhibitors: Secondary | ICD-10-CM | POA: Insufficient documentation

## 2015-12-26 DIAGNOSIS — Z923 Personal history of irradiation: Secondary | ICD-10-CM | POA: Insufficient documentation

## 2015-12-26 DIAGNOSIS — Z808 Family history of malignant neoplasm of other organs or systems: Secondary | ICD-10-CM

## 2015-12-26 DIAGNOSIS — Z801 Family history of malignant neoplasm of trachea, bronchus and lung: Secondary | ICD-10-CM | POA: Insufficient documentation

## 2015-12-26 DIAGNOSIS — C50912 Malignant neoplasm of unspecified site of left female breast: Secondary | ICD-10-CM

## 2015-12-26 DIAGNOSIS — Z170 Estrogen receptor positive status [ER+]: Secondary | ICD-10-CM | POA: Insufficient documentation

## 2015-12-26 DIAGNOSIS — R011 Cardiac murmur, unspecified: Secondary | ICD-10-CM | POA: Diagnosis not present

## 2015-12-26 DIAGNOSIS — Z87891 Personal history of nicotine dependence: Secondary | ICD-10-CM | POA: Diagnosis not present

## 2015-12-26 DIAGNOSIS — M818 Other osteoporosis without current pathological fracture: Secondary | ICD-10-CM | POA: Diagnosis not present

## 2015-12-26 DIAGNOSIS — Z9012 Acquired absence of left breast and nipple: Secondary | ICD-10-CM

## 2015-12-26 DIAGNOSIS — E079 Disorder of thyroid, unspecified: Secondary | ICD-10-CM | POA: Diagnosis not present

## 2015-12-26 DIAGNOSIS — B354 Tinea corporis: Secondary | ICD-10-CM | POA: Insufficient documentation

## 2015-12-26 DIAGNOSIS — C50412 Malignant neoplasm of upper-outer quadrant of left female breast: Secondary | ICD-10-CM | POA: Insufficient documentation

## 2015-12-26 NOTE — Progress Notes (Signed)
Patient states she has occasional constipation.  Just returned from trip to [place] and [place].  Patient here today for an area in her left axillary area.

## 2016-01-06 ENCOUNTER — Inpatient Hospital Stay: Payer: 59

## 2016-01-06 DIAGNOSIS — C50912 Malignant neoplasm of unspecified site of left female breast: Secondary | ICD-10-CM

## 2016-01-06 DIAGNOSIS — C50412 Malignant neoplasm of upper-outer quadrant of left female breast: Secondary | ICD-10-CM | POA: Diagnosis not present

## 2016-01-07 LAB — ESTRADIOL: Estradiol: <5 pg/mL

## 2016-01-07 LAB — FOLLICLE STIMULATING HORMONE: FSH: 20.3 m[IU]/mL

## 2016-01-09 ENCOUNTER — Telehealth: Payer: Self-pay | Admitting: *Deleted

## 2016-01-09 NOTE — Telephone Encounter (Signed)
-----   Message from Lequita Asal, MD sent at 01/07/2016  7:03 AM EST ----- Regarding: Call patient   Hormone studies still consistent with post-menopausal state  M  ----- Message ----- From: Interface, Lab In [place] Sent: 01/07/2016   6:39 AM To: Lequita Asal, MD

## 2016-01-09 NOTE — Telephone Encounter (Signed)
Called patient to inform her that her hormone studies show she is still consistent with post menopausal state.

## 2016-02-01 ENCOUNTER — Encounter: Payer: Self-pay | Admitting: Hematology and Oncology

## 2016-04-05 ENCOUNTER — Inpatient Hospital Stay (HOSPITAL_BASED_OUTPATIENT_CLINIC_OR_DEPARTMENT_OTHER): Payer: 59 | Admitting: Hematology and Oncology

## 2016-04-05 ENCOUNTER — Inpatient Hospital Stay: Payer: 59 | Attending: Hematology and Oncology

## 2016-04-05 VITALS — BP 106/69 | HR 76 | Temp 96.4°F | Resp 18 | Wt 137.6 lb

## 2016-04-05 DIAGNOSIS — Z923 Personal history of irradiation: Secondary | ICD-10-CM

## 2016-04-05 DIAGNOSIS — E079 Disorder of thyroid, unspecified: Secondary | ICD-10-CM

## 2016-04-05 DIAGNOSIS — M7989 Other specified soft tissue disorders: Secondary | ICD-10-CM | POA: Diagnosis not present

## 2016-04-05 DIAGNOSIS — R011 Cardiac murmur, unspecified: Secondary | ICD-10-CM | POA: Diagnosis not present

## 2016-04-05 DIAGNOSIS — Z79899 Other long term (current) drug therapy: Secondary | ICD-10-CM | POA: Insufficient documentation

## 2016-04-05 DIAGNOSIS — Z170 Estrogen receptor positive status [ER+]: Secondary | ICD-10-CM

## 2016-04-05 DIAGNOSIS — Z87891 Personal history of nicotine dependence: Secondary | ICD-10-CM | POA: Insufficient documentation

## 2016-04-05 DIAGNOSIS — Z9012 Acquired absence of left breast and nipple: Secondary | ICD-10-CM | POA: Diagnosis not present

## 2016-04-05 DIAGNOSIS — Z8041 Family history of malignant neoplasm of ovary: Secondary | ICD-10-CM

## 2016-04-05 DIAGNOSIS — Z801 Family history of malignant neoplasm of trachea, bronchus and lung: Secondary | ICD-10-CM | POA: Insufficient documentation

## 2016-04-05 DIAGNOSIS — Z8619 Personal history of other infectious and parasitic diseases: Secondary | ICD-10-CM | POA: Diagnosis not present

## 2016-04-05 DIAGNOSIS — Z808 Family history of malignant neoplasm of other organs or systems: Secondary | ICD-10-CM

## 2016-04-05 DIAGNOSIS — C50912 Malignant neoplasm of unspecified site of left female breast: Secondary | ICD-10-CM | POA: Insufficient documentation

## 2016-04-05 DIAGNOSIS — M2550 Pain in unspecified joint: Secondary | ICD-10-CM

## 2016-04-05 DIAGNOSIS — T451X5A Adverse effect of antineoplastic and immunosuppressive drugs, initial encounter: Secondary | ICD-10-CM

## 2016-04-05 DIAGNOSIS — Z79811 Long term (current) use of aromatase inhibitors: Secondary | ICD-10-CM

## 2016-04-05 DIAGNOSIS — I890 Lymphedema, not elsewhere classified: Secondary | ICD-10-CM

## 2016-04-05 LAB — COMPREHENSIVE METABOLIC PANEL
ALT: 23 U/L (ref 14–54)
AST: 23 U/L (ref 15–41)
Albumin: 4.5 g/dL (ref 3.5–5.0)
Alkaline Phosphatase: 60 U/L (ref 38–126)
Anion gap: 7 (ref 5–15)
BUN: 17 mg/dL (ref 6–20)
CO2: 23 mmol/L (ref 22–32)
Calcium: 9.3 mg/dL (ref 8.9–10.3)
Chloride: 105 mmol/L (ref 101–111)
Creatinine, Ser: 0.71 mg/dL (ref 0.44–1.00)
GFR calc Af Amer: >60 mL/min (ref >60)
GFR calc non Af Amer: >60 mL/min (ref >60)
Glucose, Bld: 117 mg/dL — ABNORMAL HIGH (ref 65–99)
Potassium: 3.9 mmol/L (ref 3.5–5.1)
Sodium: 135 mmol/L (ref 135–145)
Total Bilirubin: 0.5 mg/dL (ref 0.3–1.2)
Total Protein: 7.7 g/dL (ref 6.5–8.1)

## 2016-04-05 LAB — CBC WITH DIFFERENTIAL/PLATELET
Basophils Absolute: 0 10*3/uL (ref 0–0.1)
Basophils Relative: 1 %
Eosinophils Absolute: 0.1 10*3/uL (ref 0–0.7)
Eosinophils Relative: 2 %
HCT: 38.4 % (ref 35.0–47.0)
Hemoglobin: 13.4 g/dL (ref 12.0–16.0)
Lymphocytes Relative: 35 %
Lymphs Abs: 2.1 10*3/uL (ref 1.0–3.6)
MCH: 31.6 pg (ref 26.0–34.0)
MCHC: 34.9 g/dL (ref 32.0–36.0)
MCV: 90.5 fL (ref 80.0–100.0)
Monocytes Absolute: 0.4 10*3/uL (ref 0.2–0.9)
Monocytes Relative: 6 %
Neutro Abs: 3.5 10*3/uL (ref 1.4–6.5)
Neutrophils Relative %: 56 %
Platelets: 295 10*3/uL (ref 150–440)
RBC: 4.24 MIL/uL (ref 3.80–5.20)
RDW: 12.7 % (ref 11.5–14.5)
WBC: 6.2 10*3/uL (ref 3.6–11.0)

## 2016-04-05 NOTE — Progress Notes (Signed)
[facility] day:  04/05/16  Chief Complaint: Patricia Crane is a 46 y.o. female with stage IIA left breast cancer who is seen for 6 month assessment.  HPI: The patient was last seen in the medical oncology clinic on 12/26/2015.  At that time, she was seen for a sick call visit after a return trip from [place] and [place].  She had a small skin lesion c/w  tinea corporis (ringworm).  She states that the area improved after putting a cream on it for 4-5 weeks.  She has remained on Aromasin.  Estadiol was < 5.0 and FSH 20.3 on 01/06/2016.  Symptomatically, she notes joint pain in knee, shoulders, feet and ankle. She feels as if it is slowly getting worse since she has been on the Aromasin. She notes swelling in left forearm down to fingertips when she does yard work or travels. She states she does not take anything but keeps it moving and it eventually goes away. She is interested in a lymphedema sleeve for these occasions.    Past Medical History:  Diagnosis Date  . Breast cancer ([place]) 2014   Left  . Cancer [facility]) May 2014   Breast  left- radiation  . Chicken pox   . Heart murmur   . Shingles   . Thyroid disease     Past Surgical History:  Procedure Laterality Date  . BREAST BIOPSY Left 2014   +  . BREAST BIOPSY Right    neg- core  . lasix Bilateral     Family History  Problem Relation Age of Onset  . Hyperlipidemia Mother   . Heart disease Mother   . Dementia Mother 3    alzheimers  . Cancer Father     tonsil ca  . Hyperlipidemia Sister   . Hyperlipidemia Brother   . Heart disease Maternal Aunt   . Hyperlipidemia Maternal Aunt   . Hypertension Maternal Aunt   . Heart disease Maternal Uncle   . Hyperlipidemia Maternal Uncle   . Stroke Maternal Uncle   . Hypertension Maternal Uncle   . Cancer Paternal Aunt 38    ovarian ca  . Drug abuse Paternal Aunt   . Cancer Paternal Uncle     lung CA ,  smoker  . Breast cancer Neg  Hx     Social History:  reports that she quit smoking about 12 years ago. Her smoking use included Cigarettes. She smoked 2.00 packs per day. She has never used smokeless tobacco. She reports that she drinks alcohol. Her drug history is not on file.  She is from [place].  She has just returned from a trip to [place] and [place] in October. She works in the [employer]. The patient is alone today.  Allergies: No Known Allergies  Current Medications: Current Outpatient Prescriptions  Medication Sig Dispense Refill  . alendronate (FOSAMAX) 70 MG tablet Take 1 tablet (70 mg total) by mouth once a week. Take with a full glass of water on an empty stomach. 30 tablet 1  . ALPRAZolam (XANAX) 0.25 MG tablet Take 1 tablet (0.25 mg total) by mouth 2 (two) times daily as needed for sleep or anxiety. 60 tablet 2  . calcium carbonate (OS-CAL) 600 MG TABS tablet Take 600 mg by mouth 2 (two) times daily with a meal.    . Cholecalciferol (D3 SUPER STRENGTH) 2000 UNITS CAPS Take by mouth daily.    . Docosanol (ABREVA) 10 % CREA Apply  5 times daily to affected area 2 g 1  . exemestane (AROMASIN) 25 MG tablet TAKE 1 TABLET BY MOUTH DAILY AFTER BREAKFAST. 30 tablet 6  . Lutein 10 MG TABS Take 1 tablet by mouth daily.    . Melatonin-Pyridoxine (MELATIN) 3-1 MG TABS Take 3 mg by mouth at bedtime.    . methocarbamol (ROBAXIN) 500 MG tablet     . Misc Natural Products (OSTEO BI-FLEX ADV DOUBLE ST PO) Take by mouth.    . Multiple Vitamins-Minerals (MULTIVITAMIN WITH MINERALS) tablet Take 1 tablet by mouth daily.    . Occlusive Silicone Strips (KP SILICONE SCAR THERAPY GEL) STRP Apply nightly to scar 6 strip 0  . Probiotic Product (ALIGN) 4 MG CAPS Take 4 mg by mouth daily.    . valACYclovir (VALTREX) 500 MG tablet Take 1 tablet (500 mg total) by mouth daily. 30 tablet 11   No current facility-administered medications for this visit.     Review of Systems:  GENERAL:  Feels good.  No fevers or sweats.   Weight up 3 pounds. PERFORMANCE STATUS (ECOG):  0 HEENT:  No visual changes, runny nose, sore throat, mouth sores or tenderness. Lungs: No shortness of breath or cough.  No hemoptysis. Cardiac:  No chest pain, palpitations, orthopnea, or PND. GI:  No nausea, vomiting, diarrhea, constipation, melena or hematochezia. GU:  No urgency, frequency, dysuria, or hematuria. Musculoskeletal:  No back pain.  Intermittent joint pain in foot, ankle, knee and shoulder.  No muscle tenderness. Extremities:  No pain or swelling. Skin:  No skin issues. Neuro:  No headache, numbness or weakness, balance or coordination issues. Endocrine:  No diabetes, thyroid issues, hot flashes or night sweats. Psych:  No mood changes, depression or anxiety. Pain:  No focal pain. Review of systems:  All other systems reviewed and found to be negative.  Physical Exam: Blood pressure 106/69, pulse 76, temperature (!) 96.4 F (35.8 C), temperature source Tympanic, resp. rate 18, weight 137 lb 9.1 oz (62.4 kg). GENERAL:  Well developed, well nourished, woman sitting comfortably in the exam room in no acute distress. MENTAL STATUS:  Alert and oriented to person, place and time. HEAD:  Long dark hair.  Normocephalic, atraumatic, face symmetric, no Cushingoid features. EYES:  Brown eyes.  Pupils equal round and reactive to light and accomodation.  No conjunctivitis or scleral icterus. ENT:  Oropharynx clear without lesion.  Tongue normal. Mucous membranes moist.  RESPIRATORY:  Clear to auscultation without rales, wheezes or rhonchi. CARDIOVASCULAR:  Regular rate and rhythm without murmur, rub or gallop.  BREAST:  Right breast without masses, skin changes or nipple discharge.  Post-operative changes in the left breast.  No masses, skin changes or nipple discharge.  ABDOMEN:  Soft, non-tender, with active bowel sounds, and no hepatosplenomegaly.  No masses. SKIN:  Changes on skin due to cupping.   EXTREMITIES: Mild left upper  extremity lymphedema.  No lower extremity edema, no skin discoloration or tenderness.  No palpable cords. LYMPH NODES: No palpable cervical, supraclavicular, axillary or inguinal adenopathy  NEUROLOGICAL: Unremarkable. PSYCH:  Appropriate.   Appointment on 04/05/2016  Component Date Value Ref Range Status  . WBC 04/05/2016 6.2  3.6 - 11.0 K/uL Final  . RBC 04/05/2016 4.24  3.80 - 5.20 MIL/uL Final  . Hemoglobin 04/05/2016 13.4  12.0 - 16.0 g/dL Final  . HCT 04/05/2016 38.4  35.0 - 47.0 % Final  . MCV 04/05/2016 90.5  80.0 - 100.0 fL Final  . MCH 04/05/2016 31.6  26.0 - 34.0 pg Final  . MCHC 04/05/2016 34.9  32.0 - 36.0 g/dL Final  . RDW 29/88/2136 12.7  11.5 - 14.5 % Final  . Platelets 04/05/2016 295  150 - 440 K/uL Final  . Neutrophils Relative % 04/05/2016 56  % Final  . Neutro Abs 04/05/2016 3.5  1.4 - 6.5 K/uL Final  . Lymphocytes Relative 04/05/2016 35  % Final  . Lymphs Abs 04/05/2016 2.1  1.0 - 3.6 K/uL Final  . Monocytes Relative 04/05/2016 6  % Final  . Monocytes Absolute 04/05/2016 0.4  0.2 - 0.9 K/uL Final  . Eosinophils Relative 04/05/2016 2  % Final  . Eosinophils Absolute 04/05/2016 0.1  0 - 0.7 K/uL Final  . Basophils Relative 04/05/2016 1  % Final  . Basophils Absolute 04/05/2016 0.0  0 - 0.1 K/uL Final  . Sodium 04/05/2016 135  135 - 145 mmol/L Final  . Potassium 04/05/2016 3.9  3.5 - 5.1 mmol/L Final  . Chloride 04/05/2016 105  101 - 111 mmol/L Final  . CO2 04/05/2016 23  22 - 32 mmol/L Final  . Glucose, Bld 04/05/2016 117* 65 - 99 mg/dL Final  . BUN 53/66/4017 17  6 - 20 mg/dL Final  . Creatinine, Ser 04/05/2016 0.71  0.44 - 1.00 mg/dL Final  . Calcium 76/52/8591 9.3  8.9 - 10.3 mg/dL Final  . Total Protein 04/05/2016 7.7  6.5 - 8.1 g/dL Final  . Albumin 86/03/2209 4.5  3.5 - 5.0 g/dL Final  . AST 70/94/8414 23  15 - 41 U/L Final  . ALT 04/05/2016 23  14 - 54 U/L Final  . Alkaline Phosphatase 04/05/2016 60  38 - 126 U/L Final  . Total Bilirubin 04/05/2016  0.5  0.3 - 1.2 mg/dL Final  . GFR calc non Af Amer 04/05/2016 >60  >60 mL/min Final  . GFR calc Af Amer 04/05/2016 >60  >60 mL/min Final   Comment: (NOTE) The eGFR has been calculated using the CKD EPI equation. This calculation has not been validated in all clinical situations. eGFR's persistently <60 mL/min signify possible Chronic Kidney Disease.   . Anion gap 04/05/2016 7  5 - 15 Final    Assessment:  Patricia Crane is a 46 y.o. female stage IIA left breast cancer s/p partial mastectomy and sentinel lymph node biopsy on 08/18/2012.  Pathology revealed a 2.5 cm grade III invasive ductal carcinoma of the breast.  Margins were uninvolved, but close (5 mm).  DCIS was present.  One sentinel lymph node was negative.  Tumor was ER + (60%), PR + (25%) and Her2/neu 2+ (negative by FISH).  Pathologic stage was T2N0M0.    Oncotype DX score was 24 which translated to a 16% risk of distant recurrence at 10 years with tamoxifen alone (confidence interval 12-19%).  She declined systemic chemotherapy.  She received radiation.  She was started on Trelstar [facility] agonist) and Aromasin in 12/2012.  She had a reaction (inflammation at the injection site, swelling and fever). She was switched to Lupron (few joint aches and headache).  Last Lupron injection was in 08/2014.  She remains on Aromasin.   She has an abnormal right mammogram on 05/2013.  Biopsy was negative.  Breast MRI on 09/24/2014 revealed expected post-operative changes in the left breast and no evidence of malignancy.  Bilateral mammogram on 05/02/2015 revealed no evidence of malignancy.  She states that annual breast MRI was recommended per radiology.  CA 27.29 was 28.2 on 11/26/2013, 23.2 on 12/01/2014, and  27.6 on 10/06/2015.    She has been in menopause.  She has not had a menses for 2 years.  Estradiol was < 5 and FSH 8.2 on 01/12/2015.  Estradiol was 7.6 and [place] on 10/06/2015.  Bone density study on 05/02/2015 revealed osteoporosis with a  T-score of -2.8 in the AP spine.  She is on Fosamax, calcium, and vitamin D.  Symptomatically, she feels well.  She has intermittent joint pain that is relieved with ibuprofen and Tylenol. She would like a lymphedema sleeve for travel and strenuous work with the left arm.   Plan: 1.  Labs today:  CBC with diff, CMP, CA27.29, FSH, estradiol. 2.  Discuss joint issues.  Patient wishes to continue Aromasin.  Discuss use of NSAIDs. 3.  Schedule annual mammogram due 05/01/2016.  4.  Bone density due 04/2017.  5.  Rx:  lymphedema sleeve. 6.  Continue Aromasin. 7.  Schedule annual breast MRI 6 months after annual mammogram.  8.  RTC in 6 months for MD assessment, labs (CBC with diff, CMP, CA27.29, FSH, estradiol), and review of mammogram.  The patient was seen and examined.  The assessment and plan was discussed with the patient.  A few questions questions were asked and answered.    Faythe Casa, NP  Lequita Asal, MD   04/05/2016, 4:08 PM

## 2016-04-05 NOTE — Progress Notes (Signed)
Patient states she continues to have joint pain.  She is sleeping better. Appetite is good.  Offers no other complaints. Patient here for 6 month follow up regarding breast cancer.

## 2016-04-06 LAB — CANCER ANTIGEN 27.29: CA 27.29: 24.5 U/mL (ref 0.0–38.6)

## 2016-04-06 LAB — FOLLICLE STIMULATING HORMONE: FSH: 23.9 m[IU]/mL

## 2016-04-06 LAB — ESTRADIOL: Estradiol: 9 pg/mL

## 2016-04-07 ENCOUNTER — Encounter: Payer: Self-pay | Admitting: Hematology and Oncology

## 2016-04-07 DIAGNOSIS — M2550 Pain in unspecified joint: Secondary | ICD-10-CM | POA: Insufficient documentation

## 2016-04-07 DIAGNOSIS — I890 Lymphedema, not elsewhere classified: Secondary | ICD-10-CM | POA: Insufficient documentation

## 2016-04-07 DIAGNOSIS — T451X5A Adverse effect of antineoplastic and immunosuppressive drugs, initial encounter: Secondary | ICD-10-CM

## 2016-04-26 ENCOUNTER — Other Ambulatory Visit: Payer: Self-pay | Admitting: Hematology and Oncology

## 2016-05-02 ENCOUNTER — Other Ambulatory Visit: Payer: 59

## 2016-05-02 ENCOUNTER — Ambulatory Visit: Payer: 59

## 2016-05-04 ENCOUNTER — Other Ambulatory Visit: Payer: Self-pay | Admitting: Hematology and Oncology

## 2016-05-04 ENCOUNTER — Ambulatory Visit
Admission: RE | Admit: 2016-05-04 | Discharge: 2016-05-04 | Disposition: A | Discharge status: Home / Self Care | Payer: 59 | Source: Ambulatory Visit | Attending: Hematology and Oncology | Admitting: Hematology and Oncology

## 2016-05-04 DIAGNOSIS — C50912 Malignant neoplasm of unspecified site of left female breast: Secondary | ICD-10-CM | POA: Diagnosis present

## 2016-05-04 DIAGNOSIS — R928 Other abnormal and inconclusive findings on diagnostic imaging of breast: Secondary | ICD-10-CM | POA: Diagnosis not present

## 2016-05-08 ENCOUNTER — Other Ambulatory Visit: Payer: Self-pay | Admitting: *Deleted

## 2016-05-08 DIAGNOSIS — C50919 Malignant neoplasm of unspecified site of unspecified female breast: Secondary | ICD-10-CM

## 2016-05-10 DIAGNOSIS — L814 Other melanin hyperpigmentation: Secondary | ICD-10-CM | POA: Diagnosis not present

## 2016-07-24 ENCOUNTER — Other Ambulatory Visit: Payer: Self-pay | Admitting: Hematology and Oncology

## 2016-07-24 DIAGNOSIS — C50919 Malignant neoplasm of unspecified site of unspecified female breast: Secondary | ICD-10-CM

## 2016-08-29 ENCOUNTER — Other Ambulatory Visit: Payer: Self-pay | Admitting: Hematology and Oncology

## 2016-08-29 ENCOUNTER — Other Ambulatory Visit: Payer: Self-pay | Admitting: *Deleted

## 2016-08-29 DIAGNOSIS — C50919 Malignant neoplasm of unspecified site of unspecified female breast: Secondary | ICD-10-CM

## 2016-08-29 MED ORDER — ALENDRONATE SODIUM 70 MG PO TABS: ORAL_TABLET | ORAL | 3 refills | Status: DC

## 2016-09-05 ENCOUNTER — Encounter: Payer: Self-pay | Admitting: Internal Medicine

## 2016-09-06 ENCOUNTER — Telehealth: Payer: Self-pay | Admitting: Internal Medicine

## 2016-09-06 NOTE — Telephone Encounter (Signed)
I will absolutely see her

## 2016-09-06 NOTE — Telephone Encounter (Signed)
-----   Message from Casa sent at 09/05/2016 10:46 AM EDT ----- Regarding: Patient Pt called and would like to have a physical with pap. She has not seen you since December 2015. Would you see her or should I have her establish with another provider?  Santiago Glad

## 2016-10-03 NOTE — Progress Notes (Signed)
[facility] day:  10/04/16  Chief Complaint: Patricia Crane is a 46 y.o. female with stage IIA left breast cancer who is seen for 6 month assessment.  HPI: The patient was last seen in the medical oncology clinic on 04/05/2016.  At that time, patient complained of polyarthralgia since being started on aromatase inhibitor therapy. She has swelling in her LEFT forearm, for which a lymphedema sleeve was prescribed by Faythe Casa, NP.   Bilateral mammogram on 05/04/2016 revealed no evidence of malignancy.  She did not undergo MRI as the deductible was "too much".  She has remained on Aromasin therapy without any noted drug related side effects. Interval monitoring on 04/05/2016 included normal blood counts and chemistries.  Estadiol was 9.0 and FSH 23.9.  Symptomatically,  she is doing "good". Patient reports that she is sleeping better. States, "It is because I am not working". She reported arthralgia being treated by a chiropractor using dry needling. She reports that her overall quality of life has improved. She is no longer having the swelling in her LEFT arm. This symptom has also improve with the dry needling. She is not having to use the prescribed lymphedema sleeve. Patient has infrequent issue with chronic constipation. She uses Miralax daily. Patient reports that her hemorrhoids seem to be bothering her more.    Past Medical History:  Diagnosis Date  . Breast cancer ([place]) 2014   Left  . Cancer [facility]) May 2014   Breast  left- radiation  . Chicken pox   . Heart murmur   . Shingles   . Thyroid disease     Past Surgical History:  Procedure Laterality Date  . BREAST BIOPSY Left 2014   +  . BREAST BIOPSY Right 04/22/2013   neg- core  . BREAST LUMPECTOMY Left 2014  . lasix Bilateral     Family History  Problem Relation Age of Onset  . Hyperlipidemia Mother   . Heart disease Mother   . Dementia Mother 39       alzheimers  .  Cancer Father        tonsil ca  . Hyperlipidemia Sister   . Hyperlipidemia Brother   . Heart disease Maternal Aunt   . Hyperlipidemia Maternal Aunt   . Hypertension Maternal Aunt   . Heart disease Maternal Uncle   . Hyperlipidemia Maternal Uncle   . Stroke Maternal Uncle   . Hypertension Maternal Uncle   . Cancer Paternal Aunt 71       ovarian ca  . Drug abuse Paternal Aunt   . Cancer Paternal Uncle        lung CA ,  smoker  . Breast cancer Neg Hx     Social History:  reports that she quit smoking about 13 years ago. Her smoking use included Cigarettes. She smoked 2.00 packs per day. She has never used smokeless tobacco. She reports that she drinks alcohol. Her drug history is not on file.  She is from [place].  She has just returned from a trip to [place] and [place] in October. She works in the Her[employer]. The patient is alone today.  Allergies: No Known Allergies  Current Medications: Current Outpatient Prescriptions  Medication Sig Dispense Refill  . alendronate (FOSAMAX) 70 MG tablet TAKE 1 TABLET BY MOUTH ONCE A WEEK ON AN EMPTY STOMACH WITH FULL GLASS OF WATER 30 tablet 3  . ALPRAZolam (XANAX) 0.25 MG tablet Take 1 tablet (0.25 mg total)  by mouth 2 (two) times daily as needed for sleep or anxiety. 60 tablet 2  . calcium carbonate (OS-CAL) 600 MG TABS tablet Take 600 mg by mouth 2 (two) times daily with a meal.    . Cholecalciferol (D3 SUPER STRENGTH) 2000 UNITS CAPS Take by mouth daily.    . Docosanol (ABREVA) 10 % CREA Apply 5 times daily to affected area 2 g 1  . exemestane (AROMASIN) 25 MG tablet TAKE 1 TABLET BY MOUTH DAILY AFTER BREAKFAST. 30 tablet 6  . Lutein 10 MG TABS Take 1 tablet by mouth daily.    . Melatonin-Pyridoxine (MELATIN) 3-1 MG TABS Take 3 mg by mouth at bedtime.    . methocarbamol (ROBAXIN) 500 MG tablet     . Misc Natural Products (OSTEO BI-FLEX ADV DOUBLE ST PO) Take by mouth.    . Multiple Vitamins-Minerals (MULTIVITAMIN WITH MINERALS)  tablet Take 1 tablet by mouth daily.    . Occlusive Silicone Strips (KP SILICONE SCAR THERAPY GEL) STRP Apply nightly to scar 6 strip 0  . Probiotic Product (ALIGN) 4 MG CAPS Take 4 mg by mouth daily.    . valACYclovir (VALTREX) 500 MG tablet Take 1 tablet (500 mg total) by mouth daily. 30 tablet 11   No current facility-administered medications for this visit.     Review of Systems:  GENERAL:  Feels "good".  No fevers or sweats.  Weight up 1 pound. PERFORMANCE STATUS (ECOG):  0 HEENT:  No visual changes, runny nose, sore throat, mouth sores or tenderness. Lungs: No shortness of breath or cough.  No hemoptysis. Cardiac:  No chest pain, palpitations, orthopnea, or PND. GI:  Some constipation. Hemorrhoids. No nausea, vomiting, diarrhea, melena or hematochezia. GU:  No urgency, frequency, dysuria, or hematuria. Musculoskeletal:  No back pain.  Intermittent joint pain in feet, ankles, knees and shoulders (see HPI).  No muscle tenderness. Extremities:  No pain or swelling. Skin:  No skin issues. Neuro:  No headache, numbness or weakness, balance or coordination issues. Endocrine:  No diabetes, thyroid issues, hot flashes or night sweats. Psych:  No mood changes, depression or anxiety. Pain:  No focal pain. Review of systems:  All other systems reviewed and found to be negative.  Physical Exam: Blood pressure 104/73, pulse 66, temperature 98.6 F (37 C), temperature source Tympanic, resp. rate 18, weight 138 lb 8 oz (62.8 kg), last menstrual period 12/17/2012. GENERAL:  Well developed, well nourished, woman sitting comfortably in the exam room in no acute distress. MENTAL STATUS:  Alert and oriented to person, place and time. HEAD:  Long dark hair.  Normocephalic, atraumatic, face symmetric, no Cushingoid features. EYES:  Brown eyes.  Pupils equal round and reactive to light and accomodation.  No conjunctivitis or scleral icterus. ENT:  Oropharynx clear without lesion.  Tongue normal.  Mucous membranes moist.  RESPIRATORY:  Clear to auscultation without rales, wheezes or rhonchi. CARDIOVASCULAR:  Regular rate and rhythm without murmur, rub or gallop.  BREAST:  Right breast without masses, skin changes or nipple discharge.  Fibrocystic changes at the 9 o'clock position.  Post-operative and fibrocystic changes in the left breast, tender.  No masses, skin changes or nipple discharge.  ABDOMEN:  Soft, non-tender, with active bowel sounds, and no hepatosplenomegaly.  No masses. SKIN:  Changes on skin due to cupping.   EXTREMITIES:  No lower extremity edema, no skin discoloration or tenderness.  No palpable cords. LYMPH NODES: No palpable cervical, supraclavicular, axillary or inguinal adenopathy  NEUROLOGICAL: Unremarkable. PSYCH:  Appropriate.   Appointment on 10/04/2016  Component Date Value Ref Range Status  . Sodium 10/04/2016 139  135 - 145 mmol/L Final  . Potassium 10/04/2016 3.7  3.5 - 5.1 mmol/L Final  . Chloride 10/04/2016 103  101 - 111 mmol/L Final  . CO2 10/04/2016 29  22 - 32 mmol/L Final  . Glucose, Bld 10/04/2016 103* 65 - 99 mg/dL Final  . BUN 10/04/2016 12  6 - 20 mg/dL Final  . Creatinine, Ser 10/04/2016 0.71  0.44 - 1.00 mg/dL Final  . Calcium 10/04/2016 9.7  8.9 - 10.3 mg/dL Final  . Total Protein 10/04/2016 7.6  6.5 - 8.1 g/dL Final  . Albumin 10/04/2016 4.6  3.5 - 5.0 g/dL Final  . AST 10/04/2016 21  15 - 41 U/L Final  . ALT 10/04/2016 20  14 - 54 U/L Final  . Alkaline Phosphatase 10/04/2016 65  38 - 126 U/L Final  . Total Bilirubin 10/04/2016 0.5  0.3 - 1.2 mg/dL Final  . GFR calc non Af Amer 10/04/2016 >60  >60 mL/min Final  . GFR calc Af Amer 10/04/2016 >60  >60 mL/min Final   Comment: (NOTE) The eGFR has been calculated using the CKD EPI equation. This calculation has not been validated in all clinical situations. eGFR's persistently <60 mL/min signify possible Chronic Kidney Disease.   . Anion gap 10/04/2016 7  5 - 15 Final  . WBC  10/04/2016 5.6  3.6 - 11.0 K/uL Final  . RBC 10/04/2016 4.36  3.80 - 5.20 MIL/uL Final  . Hemoglobin 10/04/2016 13.7  12.0 - 16.0 g/dL Final  . HCT 10/04/2016 40.2  35.0 - 47.0 % Final  . MCV 10/04/2016 92.2  80.0 - 100.0 fL Final  . MCH 10/04/2016 31.5  26.0 - 34.0 pg Final  . MCHC 10/04/2016 34.1  32.0 - 36.0 g/dL Final  . RDW 10/04/2016 13.2  11.5 - 14.5 % Final  . Platelets 10/04/2016 295  150 - 440 K/uL Final  . Neutrophils Relative % 10/04/2016 56  % Final  . Neutro Abs 10/04/2016 3.1  1.4 - 6.5 K/uL Final  . Lymphocytes Relative 10/04/2016 35  % Final  . Lymphs Abs 10/04/2016 2.0  1.0 - 3.6 K/uL Final  . Monocytes Relative 10/04/2016 6  % Final  . Monocytes Absolute 10/04/2016 0.4  0.2 - 0.9 K/uL Final  . Eosinophils Relative 10/04/2016 2  % Final  . Eosinophils Absolute 10/04/2016 0.1  0 - 0.7 K/uL Final  . Basophils Relative 10/04/2016 1  % Final  . Basophils Absolute 10/04/2016 0.0  0 - 0.1 K/uL Final    Assessment:  Patricia Crane is a 46 y.o. female stage IIA left breast cancer s/p partial mastectomy and sentinel lymph node biopsy on 08/18/2012.  Pathology revealed a 2.5 cm grade III invasive ductal carcinoma of the breast.  Margins were uninvolved, but close (5 mm).  DCIS was present.  One sentinel lymph node was negative.  Tumor was ER + (60%), PR + (25%) and Her2/neu 2+ (negative by FISH).  Pathologic stage was T2N0M0.    Oncotype DX score was 24 which translated to a 16% risk of distant recurrence at 10 years with tamoxifen alone (confidence interval 12-19%).  She declined systemic chemotherapy.  She received radiation.  She was started on Trelstar [facility] agonist) and Aromasin in 12/2012.  She had a reaction (inflammation at the injection site, swelling and fever). She was switched to Lupron (few joint aches and headache).  Last  Lupron injection was in 08/2014.  She remains on Aromasin.   She has an abnormal right mammogram on 05/2013.  Biopsy was negative.  Breast MRI on  09/24/2014 revealed expected post-operative changes in the left breast and no evidence of malignancy.  Bilateral mammogram on 05/02/2015 and 05/04/2016 revealed no evidence of malignancy.  She states that annual breast MRI was recommended per radiology.  CA 27.29 was 28.2 on 11/26/2013, 23.2 on 12/01/2014,  27.6 on 10/06/2015, 24.5 on 04/05/2016  She has been in menopause.  She has not had a menses for 2 years.  Estradiol was < 5 and FSH 8.2 on 01/12/2015.  Estradiol was 7.6 and FSH 7.6 on 10/06/2015.  Estradiol was 9.0 and FSH 23.9 on 04/05/2016.  Bone density study on 05/02/2015 revealed osteoporosis with a T-score of -2.8 in the AP spine.  She is on Fosamax, calcium, and vitamin D.  Symptomatically, she is doing well overall. She has some constipation. Exam stable.   Plan: 1.  Labs today:  CBC with diff, CMP, CA27.29, FSH, estradiol. 2.  Review interval mammogram- no evidence of malignancy. Breast MRI scheduled for 11/01/2016 (previously ordered), however patient is requesting to cancel the study due to the cost of the exam.    3.  Bone density due 05/01/2017.  4.  Continue Aromasin. 5.  Add daily stool softener. Increase fiber and fluid intake. Constipation has been going on for years. May use over the counter MOM as well to help with the constipation. 6.  Discussed BCI testing with patient. Patient given information today. Will will make a decision on testing during her next RTC visit.   7.  RTC in 6 months for MD assessment and labs (CBC with diff, CMP, CA27.29, FSH, estradiol).   Honor Loh, NP   10/04/2016, 3:31 PM   I saw and evaluated the patient, participating in the key portions of the service and reviewing pertinent diagnostic studies and records.  I reviewed the nurse practitioner's note and agree with the findings and the plan.  The assessment and plan were discussed with the patient.  A few questions were asked by the patient and answered.   Lequita Asal,  MD 8/16/20184:03 PM

## 2016-10-04 ENCOUNTER — Encounter: Payer: Self-pay | Admitting: Hematology and Oncology

## 2016-10-04 ENCOUNTER — Inpatient Hospital Stay: Payer: 59 | Attending: Hematology and Oncology

## 2016-10-04 ENCOUNTER — Inpatient Hospital Stay (HOSPITAL_BASED_OUTPATIENT_CLINIC_OR_DEPARTMENT_OTHER): Payer: 59 | Admitting: Hematology and Oncology

## 2016-10-04 VITALS — BP 104/73 | HR 66 | Temp 98.6°F | Resp 18 | Wt 138.5 lb

## 2016-10-04 DIAGNOSIS — Z170 Estrogen receptor positive status [ER+]: Secondary | ICD-10-CM

## 2016-10-04 DIAGNOSIS — E079 Disorder of thyroid, unspecified: Secondary | ICD-10-CM | POA: Insufficient documentation

## 2016-10-04 DIAGNOSIS — Z87891 Personal history of nicotine dependence: Secondary | ICD-10-CM | POA: Insufficient documentation

## 2016-10-04 DIAGNOSIS — M7989 Other specified soft tissue disorders: Secondary | ICD-10-CM | POA: Insufficient documentation

## 2016-10-04 DIAGNOSIS — Z923 Personal history of irradiation: Secondary | ICD-10-CM

## 2016-10-04 DIAGNOSIS — Z809 Family history of malignant neoplasm, unspecified: Secondary | ICD-10-CM | POA: Diagnosis not present

## 2016-10-04 DIAGNOSIS — I890 Lymphedema, not elsewhere classified: Secondary | ICD-10-CM | POA: Insufficient documentation

## 2016-10-04 DIAGNOSIS — Z8619 Personal history of other infectious and parasitic diseases: Secondary | ICD-10-CM | POA: Diagnosis not present

## 2016-10-04 DIAGNOSIS — M810 Age-related osteoporosis without current pathological fracture: Secondary | ICD-10-CM

## 2016-10-04 DIAGNOSIS — Z79811 Long term (current) use of aromatase inhibitors: Secondary | ICD-10-CM | POA: Insufficient documentation

## 2016-10-04 DIAGNOSIS — C50912 Malignant neoplasm of unspecified site of left female breast: Secondary | ICD-10-CM | POA: Diagnosis not present

## 2016-10-04 DIAGNOSIS — K5909 Other constipation: Secondary | ICD-10-CM

## 2016-10-04 DIAGNOSIS — C50919 Malignant neoplasm of unspecified site of unspecified female breast: Secondary | ICD-10-CM

## 2016-10-04 DIAGNOSIS — K5900 Constipation, unspecified: Secondary | ICD-10-CM | POA: Diagnosis not present

## 2016-10-04 LAB — CBC WITH DIFFERENTIAL/PLATELET
Basophils Absolute: 0 10*3/uL (ref 0–0.1)
Basophils Relative: 1 %
Eosinophils Absolute: 0.1 10*3/uL (ref 0–0.7)
Eosinophils Relative: 2 %
HCT: 40.2 % (ref 35.0–47.0)
Hemoglobin: 13.7 g/dL (ref 12.0–16.0)
Lymphocytes Relative: 35 %
Lymphs Abs: 2 10*3/uL (ref 1.0–3.6)
MCH: 31.5 pg (ref 26.0–34.0)
MCHC: 34.1 g/dL (ref 32.0–36.0)
MCV: 92.2 fL (ref 80.0–100.0)
Monocytes Absolute: 0.4 10*3/uL (ref 0.2–0.9)
Monocytes Relative: 6 %
Neutro Abs: 3.1 10*3/uL (ref 1.4–6.5)
Neutrophils Relative %: 56 %
Platelets: 295 10*3/uL (ref 150–440)
RBC: 4.36 MIL/uL (ref 3.80–5.20)
RDW: 13.2 % (ref 11.5–14.5)
WBC: 5.6 10*3/uL (ref 3.6–11.0)

## 2016-10-04 LAB — COMPREHENSIVE METABOLIC PANEL
ALT: 20 U/L (ref 14–54)
AST: 21 U/L (ref 15–41)
Albumin: 4.6 g/dL (ref 3.5–5.0)
Alkaline Phosphatase: 65 U/L (ref 38–126)
Anion gap: 7 (ref 5–15)
BUN: 12 mg/dL (ref 6–20)
CO2: 29 mmol/L (ref 22–32)
Calcium: 9.7 mg/dL (ref 8.9–10.3)
Chloride: 103 mmol/L (ref 101–111)
Creatinine, Ser: 0.71 mg/dL (ref 0.44–1.00)
GFR calc Af Amer: >60 mL/min (ref >60)
GFR calc non Af Amer: >60 mL/min (ref >60)
Glucose, Bld: 103 mg/dL — ABNORMAL HIGH (ref 65–99)
Potassium: 3.7 mmol/L (ref 3.5–5.1)
Sodium: 139 mmol/L (ref 135–145)
Total Bilirubin: 0.5 mg/dL (ref 0.3–1.2)
Total Protein: 7.6 g/dL (ref 6.5–8.1)

## 2016-10-04 NOTE — Progress Notes (Signed)
Patient states she has a headache today.  She is currently not working and has been working in her home getting ready for family that will be visiting for 3 weeks.  She is having problems with bladder control.  Otherwise, offers no complaints.

## 2016-10-05 ENCOUNTER — Encounter: Payer: Self-pay | Admitting: *Deleted

## 2016-10-05 ENCOUNTER — Telehealth: Payer: Self-pay | Admitting: *Deleted

## 2016-10-05 LAB — CANCER ANTIGEN 27.29: CA 27.29: 23.3 U/mL (ref 0.0–38.6)

## 2016-10-05 LAB — FOLLICLE STIMULATING HORMONE: FSH: 26.2 m[IU]/mL

## 2016-10-05 LAB — ESTRADIOL: Estradiol: 10.6 pg/mL

## 2016-10-05 NOTE — Telephone Encounter (Signed)
Called and left message for patient r/t lab results, instructed pt to call for specifics.

## 2016-10-12 NOTE — Telephone Encounter (Signed)
Error

## 2016-10-31 ENCOUNTER — Other Ambulatory Visit (HOSPITAL_COMMUNITY)
Admission: RE | Admit: 2016-10-31 | Discharge: 2016-10-31 | Disposition: A | Discharge status: Home / Self Care | Payer: 59 | Source: Ambulatory Visit | Attending: Internal Medicine | Admitting: Internal Medicine

## 2016-10-31 ENCOUNTER — Encounter: Payer: Self-pay | Admitting: Internal Medicine

## 2016-10-31 ENCOUNTER — Ambulatory Visit (INDEPENDENT_AMBULATORY_CARE_PROVIDER_SITE_OTHER): Payer: 59 | Admitting: Internal Medicine

## 2016-10-31 VITALS — BP 102/64 | HR 81 | Temp 98.3°F | Resp 15 | Ht 60.0 in | Wt 136.8 lb

## 2016-10-31 DIAGNOSIS — M818 Other osteoporosis without current pathological fracture: Secondary | ICD-10-CM | POA: Diagnosis not present

## 2016-10-31 DIAGNOSIS — I890 Lymphedema, not elsewhere classified: Secondary | ICD-10-CM

## 2016-10-31 DIAGNOSIS — E559 Vitamin D deficiency, unspecified: Secondary | ICD-10-CM

## 2016-10-31 DIAGNOSIS — E7800 Pure hypercholesterolemia, unspecified: Secondary | ICD-10-CM | POA: Diagnosis not present

## 2016-10-31 DIAGNOSIS — Z0000 Encounter for general adult medical examination without abnormal findings: Secondary | ICD-10-CM | POA: Diagnosis not present

## 2016-10-31 DIAGNOSIS — K5909 Other constipation: Secondary | ICD-10-CM | POA: Diagnosis not present

## 2016-10-31 DIAGNOSIS — R5383 Other fatigue: Secondary | ICD-10-CM

## 2016-10-31 DIAGNOSIS — C50912 Malignant neoplasm of unspecified site of left female breast: Secondary | ICD-10-CM

## 2016-10-31 DIAGNOSIS — R0683 Snoring: Secondary | ICD-10-CM | POA: Diagnosis not present

## 2016-10-31 DIAGNOSIS — Z8639 Personal history of other endocrine, nutritional and metabolic disease: Secondary | ICD-10-CM | POA: Diagnosis not present

## 2016-10-31 DIAGNOSIS — Z124 Encounter for screening for malignant neoplasm of cervix: Secondary | ICD-10-CM

## 2016-10-31 MED ORDER — TRIAMCINOLONE ACETONIDE 0.1 % EX CREA: 1.0000 "application " | TOPICAL_CREAM | Freq: Two times a day (BID) | CUTANEOUS | 2 refills | Status: DC

## 2016-10-31 NOTE — Progress Notes (Signed)
Patient ID: Patricia Crane, female    DOB: 1970-09-02  Age: 46 y.o. MRN: [id number]  The patient is here for annual  PREVENTIVE examination and management of other chronic and acute problems.    Menopause x 1 year.  Osteoporosis  2017 DEXA on fosamax No recent lipid panel      The risk factors are reflected in the social history.  The roster of all physicians providing medical care to patient - is listed in the Snapshot section of the chart.  Activities of daily living:  The patient is 100% independent in all ADLs: dressing, toileting, feeding as well as independent mobility  Home safety : The patient has smoke detectors in the home. They wear seatbelts.  There are no firearms at home. There is no violence in the home.   There is no risks for hepatitis, STDs or HIV. There is no   history of blood transfusion. They have no travel history to infectious disease endemic areas of the world.  The patient has seen their dentist in the last six month. They have seen their eye doctor in the last year.    Discussed the need for sun protection: hats, long sleeves and use of sunscreen if there is significant sun exposure.   Diet: the importance of a healthy diet is discussed. They do have a healthy diet.  The benefits of regular aerobic exercise were discussed. She walks 4 times per week ,  20 minutes.   Depression screen: there are no signs or vegative symptoms of depression- irritability, change in appetite, anhedonia, sadness/tearfullness.  The following portions of the patient's history were reviewed and updated as appropriate: allergies, current medications, past family history, past medical history,  past surgical history, past social history  and problem list.  Visual acuity was not assessed per patient preference since she has regular follow up with her ophthalmologist. Hearing and body mass index were assessed and reviewed.   During the course of the visit the patient was educated and  counseled about appropriate screening and preventive services including : fall prevention , diabetes screening, nutrition counseling, colorectal cancer screening, and recommended immunizations.    CC: The primary encounter diagnosis was Screening for cervical cancer. Diagnoses of Vitamin D deficiency, Fatigue, unspecified type, Pure hypercholesterolemia, Lymphedema of left arm, Snoring, Routine general medical examination at a health care facility, Malignant neoplasm of left breast, stage 2 ([place]), History of Graves' disease, Other constipation, and Other osteoporosis without current pathological fracture were also pertinent to this visit.   History of BRCA left breast 2014   managed by Mike Gip  Last mammogram March 2018 . Joint pain due to aromatase inhibitor therapy,  Sleeve given for  lymphedema left arm.  Both have  improved with dry needling.   Cc: persistent Fatigue:  Snoring ,  Wakes up early and can't go back to sleep . Uses humidifier .husband has noted gasping sounds,  Needs sleep study    2) constipation Has been taking miralax daily for the past 2 months due to constipation wich started 2 years ago during adjuvant therapy,  Now having rectal bleeding , history of hemorrhoidal bleeding about once a month ,  Not severe.  Brought on by straining. Discussed referral to Dr Allen Norris.    History Fannye has a past medical history of Breast cancer ([place]) (2014); Cancer [facility]) (May 2014); Chicken pox; Heart murmur; Shingles; and Thyroid disease.   She has a past surgical history that includes lasix (Bilateral); Breast biopsy (Left,  2014); Breast biopsy (Right, 04/22/2013); and Breast lumpectomy (Left, 2014).   Her family history includes Cancer in her father and paternal uncle; Cancer (age of onset: 46) in her paternal aunt; Dementia (age of onset: 62) in her mother; Drug abuse in her paternal aunt; Heart disease in her maternal aunt, maternal uncle, and mother; Hyperlipidemia in her brother, maternal  aunt, maternal uncle, mother, and sister; Hypertension in her maternal aunt and maternal uncle; Stroke in her maternal uncle.She reports that she quit smoking about 13 years ago. Her smoking use included Cigarettes. She smoked 2.00 packs per day. She has never used smokeless tobacco. She reports that she drinks alcohol. Her drug history is not on file.  Outpatient Medications Prior to Visit  Medication Sig Dispense Refill  . alendronate (FOSAMAX) 70 MG tablet TAKE 1 TABLET BY MOUTH ONCE A WEEK ON AN EMPTY STOMACH WITH FULL GLASS OF WATER 30 tablet 3  . ALPRAZolam (XANAX) 0.25 MG tablet Take 1 tablet (0.25 mg total) by mouth 2 (two) times daily as needed for sleep or anxiety. 60 tablet 2  . calcium carbonate (OS-CAL) 600 MG TABS tablet Take 600 mg by mouth 2 (two) times daily with a meal.    . Cholecalciferol (D3 SUPER STRENGTH) 2000 UNITS CAPS Take by mouth daily.    Marland Kitchen exemestane (AROMASIN) 25 MG tablet TAKE 1 TABLET BY MOUTH DAILY AFTER BREAKFAST. 30 tablet 6  . Melatonin-Pyridoxine (MELATIN) 3-1 MG TABS Take 3 mg by mouth at bedtime.    . methocarbamol (ROBAXIN) 500 MG tablet     . Misc Natural Products (OSTEO BI-FLEX ADV DOUBLE ST PO) Take by mouth.    . Multiple Vitamins-Minerals (MULTIVITAMIN WITH MINERALS) tablet Take 1 tablet by mouth daily.    . Occlusive Silicone Strips (KP SILICONE SCAR THERAPY GEL) STRP Apply nightly to scar 6 strip 0  . Probiotic Product (ALIGN) 4 MG CAPS Take 4 mg by mouth daily.    . valACYclovir (VALTREX) 500 MG tablet Take 1 tablet (500 mg total) by mouth daily. 30 tablet 11  . Docosanol (ABREVA) 10 % CREA Apply 5 times daily to affected area (Patient not taking: Reported on 10/31/2016) 2 g 1  . Lutein 10 MG TABS Take 1 tablet by mouth daily.     No facility-administered medications prior to visit.     Review of Systems   Patient denies headache, fevers, malaise, unintentional weight loss, skin rash, eye pain, sinus congestion and sinus pain, sore throat,  dysphagia,  hemoptysis , cough, dyspnea, wheezing, chest pain, palpitations, orthopnea, edema, abdominal pain, nausea, melena, diarrhea, constipation, flank pain, dysuria, hematuria, urinary  Frequency, nocturia, numbness, tingling, seizures,  Focal weakness, Loss of consciousness,  Tremor, insomnia, depression, anxiety, and suicidal ideation.      Objective:  BP 102/64 (BP Location: Left Arm, Patient Position: Sitting, Cuff Size: Normal)   Pulse 81   Temp 98.3 F (36.8 C) (Oral)   Resp 15   Ht 5' (1.524 m)   Wt 136 lb 12.8 oz (62.1 kg)   LMP 12/17/2012   SpO2 96%   BMI 26.72 kg/m   Physical Exam   General Appearance:    Alert, cooperative, no distress, appears stated age  Head:    Normocephalic, without obvious abnormality, atraumatic  Eyes:    PERRL, conjunctiva/corneas clear, EOM's intact, fundi    benign, both eyes  Ears:    Normal TM's and external ear canals, both ears  Nose:   Nares normal, septum midline, mucosa  normal, no drainage    or sinus tenderness  Throat:   Lips, mucosa, and tongue normal; teeth and gums normal  Neck:   Supple, symmetrical, trachea midline, no adenopathy;    thyroid:  no enlargement/tenderness/nodules; no carotid   bruit or JVD  Back:     Symmetric, no curvature, ROM normal, no CVA tenderness  Lungs:     Clear to auscultation bilaterally, respirations unlabored  Chest Wall:    No tenderness or deformity   Heart:    Regular rate and rhythm, S1 and S2 normal, no murmur, rub   or gallop  Breast Exam:    Right: No tenderness, masses, or nipple abnormality. Left: mild tenderness at the lumpectomy scar , hyperpigmented linear streak in axilla tow hyperpigmented  Macular coinsedh shaped  patches left axilla  Abdomen:     Soft, non-tender, bowel sounds active all four quadrants,    no masses, no organomegaly  Genitalia:    Pelvic: cervix normal in appearance, external genitalia normal, no adnexal masses or tenderness, no cervical motion tenderness,  rectovaginal septum normal, uterus normal size, shape, and consistency and vagina normal without discharge  Extremities:   Extremities normal, atraumatic, no cyanosis or edema  Pulses:   2+ and symmetric all extremities  Skin:   Skin color, texture, turgor normal, no rashes or lesions  Lymph nodes:   Cervical, supraclavicular, and axillary nodes normal  Neurologic:   CNII-XII intact, normal strength, sensation and reflexes    throughout    Assessment & Plan:   Problem List Items Addressed This Visit    Constipation    Chronic ,  Worsening with new onset rectal bleeding.  Marland Kitchen  Referral to darren wohl for colonoscopy       Relevant Orders   Ambulatory referral to Gastroenterology   Fatigue    Chronic, etiology unclear;  may be multifactorial.  Has been lost to follow up and thyroid level has not been checked in years.  Sleep study also ordered to investigate c/o snoring and gasping at night       Relevant Orders   TSH   Nocturnal polysomnography (NPSG)   History of Graves' disease    Treated with PTU for 2 years in 2009.  With resolution .  Repeat TSH is overdue   Lab Results  Component Value Date   TSH 1.08 02/02/2014         Lymphedema of left arm    Improved and nearly resolved.       Malignant neoplasm of breast, stage 2 ([place])    She has had no recurrence since diagnosis in 2014. Continue annual mammogram and aromatase  Inhibitor therapy       Osteoporosis    She is tolerating weekly alendronate , started in 2016       Routine general medical examination at a health care facility    Annual comprehensive preventive exam was done as well as an evaluation and management of chronic conditions .  During the course of the visit the patient was educated and counseled about appropriate screening and preventive services including :  diabetes screening, lipid analysis with projected  10 year  risk for CAD , nutrition counseling, breast, cervical and colorectal cancer screening,  and recommended immunizations.  Printed recommendations for health maintenance screenings was given.  PAP smear and breast exam done       Snoring    Chronic, with witnessed gasping at night and excessive daytime fatigue.  Refer for sleep  study to rule out OSA      Relevant Orders   Nocturnal polysomnography (NPSG)    Other Visit Diagnoses    Screening for cervical cancer    -  Primary   Relevant Orders   Cytology - PAP (Completed)   Vitamin D deficiency       Relevant Orders   VITAMIN D 25 Hydroxy (Vit-D Deficiency, Fractures)   Pure hypercholesterolemia       Relevant Orders   Lipid panel      I have discontinued Ms. Blackerby's Lutein and Docosanol. I am also having her start on triamcinolone cream. Additionally, I am having her maintain her Melatonin-Pyridoxine, ALIGN, multivitamin with minerals, Cholecalciferol, Misc Natural Products (OSTEO BI-FLEX ADV DOUBLE ST PO), ALPRAZolam, KP SILICONE SCAR THERAPY GEL, calcium carbonate, valACYclovir, methocarbamol, exemestane, and alendronate.  Meds ordered this encounter  Medications  . triamcinolone cream (KENALOG) 0.1 %    Sig: Apply 1 application topically 2 (two) times daily.    Dispense:  30 g    Refill:  2    Medications Discontinued During This Encounter  Medication Reason  . Docosanol (ABREVA) 10 % CREA Patient has not taken in last 30 days  . Lutein 10 MG TABS Patient has not taken in last 30 days    Follow-up: Return for FASTING LAB APPT NEEDED .   [name], MD

## 2016-10-31 NOTE — Patient Instructions (Signed)
SO GOOD TO SEE YOU!!!  YOU LOOK AS BEAUTIFUL AS EVER!!!  Triamcinolone cream twice daily to patch under left underam  If it gets worse,  Call me and I will add an antifungal   return for fasting labs next week or so [facility] lab appointment)  Turn of laptops and I pads 2 hours before bedtime  If no improvement in insomnia call to set up overnight sleep study     Health Maintenance for Postmenopausal Women Menopause is a normal process in which your reproductive ability comes to an end. This process happens gradually over a span of months to years, usually between the ages of 62 and 78. Menopause is complete when you have missed 12 consecutive menstrual periods. It is important to talk with your health care provider about some of the most common conditions that affect postmenopausal women, such as heart disease, cancer, and bone loss (osteoporosis). Adopting a healthy lifestyle and getting preventive care can help to promote your health and wellness. Those actions can also lower your chances of developing some of these common conditions. What should I know about menopause? During menopause, you may experience a number of symptoms, such as:  Moderate-to-severe hot flashes.  Night sweats.  Decrease in sex drive.  Mood swings.  Headaches.  Tiredness.  Irritability.  Memory problems.  Insomnia.  Choosing to treat or not to treat menopausal changes is an individual decision that you make with your health care provider. What should I know about hormone replacement therapy and supplements? Hormone therapy products are effective for treating symptoms that are associated with menopause, such as hot flashes and night sweats. Hormone replacement carries certain risks, especially as you become older. If you are thinking about using estrogen or estrogen with progestin treatments, discuss the benefits and risks with your health care provider. What should I know about heart disease and  stroke? Heart disease, heart attack, and stroke become more likely as you age. This may be due, in part, to the hormonal changes that your body experiences during menopause. These can affect how your body processes dietary fats, triglycerides, and cholesterol. Heart attack and stroke are both medical emergencies. There are many things that you can do to help prevent heart disease and stroke:  Have your blood pressure checked at least every 1-2 years. High blood pressure causes heart disease and increases the risk of stroke.  If you are 50-80 years old, ask your health care provider if you should take aspirin to prevent a heart attack or a stroke.  Do not use any tobacco products, including cigarettes, chewing tobacco, or electronic cigarettes. If you need help quitting, ask your health care provider.  It is important to eat a healthy diet and maintain a healthy weight. ? Be sure to include plenty of vegetables, fruits, low-fat dairy products, and lean protein. ? Avoid eating foods that are high in solid fats, added sugars, or salt (sodium).  Get regular exercise. This is one of the most important things that you can do for your health. ? Try to exercise for at least 150 minutes each week. The type of exercise that you do should increase your heart rate and make you sweat. This is known as moderate-intensity exercise. ? Try to do strengthening exercises at least twice each week. Do these in addition to the moderate-intensity exercise.  Know your numbers.Ask your health care provider to check your cholesterol and your blood glucose. Continue to have your blood tested as directed by your health  care provider.  What should I know about cancer screening? There are several types of cancer. Take the following steps to reduce your risk and to catch any cancer development as early as possible. Breast Cancer  Practice breast self-awareness. ? This means understanding how your breasts normally appear  and feel. ? It also means doing regular breast self-exams. Let your health care provider know about any changes, no matter how small.  If you are 80 or older, have a clinician do a breast exam (clinical breast exam or CBE) every year. Depending on your age, family history, and medical history, it may be recommended that you also have a yearly breast X-ray (mammogram).  If you have a family history of breast cancer, talk with your health care provider about genetic screening.  If you are at high risk for breast cancer, talk with your health care provider about having an MRI and a mammogram every year.  Breast cancer (BRCA) gene test is recommended for women who have family members with BRCA-related cancers. Results of the assessment will determine the need for genetic counseling and BRCA1 and for BRCA2 testing. BRCA-related cancers include these types: ? Breast. This occurs in males or females. ? Ovarian. ? Tubal. This may also be called fallopian tube cancer. ? Cancer of the abdominal or pelvic lining (peritoneal cancer). ? Prostate. ? Pancreatic.  Cervical, Uterine, and Ovarian Cancer Your health care provider may recommend that you be screened regularly for cancer of the pelvic organs. These include your ovaries, uterus, and vagina. This screening involves a pelvic exam, which includes checking for microscopic changes to the surface of your cervix (Pap test).  For women ages 21-65, health care providers may recommend a pelvic exam and a Pap test every three years. For women ages 58-65, they may recommend the Pap test and pelvic exam, combined with testing for human papilloma virus (HPV), every five years. Some types of HPV increase your risk of cervical cancer. Testing for HPV may also be done on women of any age who have unclear Pap test results.  Other health care providers may not recommend any screening for nonpregnant women who are considered low risk for pelvic cancer and have no  symptoms. Ask your health care provider if a screening pelvic exam is right for you.  If you have had past treatment for cervical cancer or a condition that could lead to cancer, you need Pap tests and screening for cancer for at least 20 years after your treatment. If Pap tests have been discontinued for you, your risk factors (such as having a new sexual partner) need to be reassessed to determine if you should start having screenings again. Some women have medical problems that increase the chance of getting cervical cancer. In these cases, your health care provider may recommend that you have screening and Pap tests more often.  If you have a family history of uterine cancer or ovarian cancer, talk with your health care provider about genetic screening.  If you have vaginal bleeding after reaching menopause, tell your health care provider.  There are currently no reliable tests available to screen for ovarian cancer.  Lung Cancer Lung cancer screening is recommended for adults 19-54 years old who are at high risk for lung cancer because of a history of smoking. A yearly low-dose CT scan of the lungs is recommended if you:  Currently smoke.  Have a history of at least 30 pack-years of smoking and you currently smoke or have quit  within the past 15 years. A pack-year is smoking an average of one pack of cigarettes per day for one year.  Yearly screening should:  Continue until it has been 15 years since you quit.  Stop if you develop a health problem that would prevent you from having lung cancer treatment.  Colorectal Cancer  This type of cancer can be detected and can often be prevented.  Routine colorectal cancer screening usually begins at age 27 and continues through age 48.  If you have risk factors for colon cancer, your health care provider may recommend that you be screened at an earlier age.  If you have a family history of colorectal cancer, talk with your health care  provider about genetic screening.  Your health care provider may also recommend using home test kits to check for hidden blood in your stool.  A small camera at the end of a tube can be used to examine your colon directly (sigmoidoscopy or colonoscopy). This is done to check for the earliest forms of colorectal cancer.  Direct examination of the colon should be repeated every 5-10 years until age 51. However, if early forms of precancerous polyps or small growths are found or if you have a family history or genetic risk for colorectal cancer, you may need to be screened more often.  Skin Cancer  Check your skin from head to toe regularly.  Monitor any moles. Be sure to tell your health care provider: ? About any new moles or changes in moles, especially if there is a change in a mole's shape or color. ? If you have a mole that is larger than the size of a pencil eraser.  If any of your family members has a history of skin cancer, especially at a young age, talk with your health care provider about genetic screening.  Always use sunscreen. Apply sunscreen liberally and repeatedly throughout the day.  Whenever you are outside, protect yourself by wearing long sleeves, pants, a wide-brimmed hat, and sunglasses.  What should I know about osteoporosis? Osteoporosis is a condition in which bone destruction happens more quickly than new bone creation. After menopause, you may be at an increased risk for osteoporosis. To help prevent osteoporosis or the bone fractures that can happen because of osteoporosis, the following is recommended:  If you are 78-87 years old, get at least 1,000 mg of calcium and at least 600 mg of vitamin D per day.  If you are older than age 47 but younger than age 73, get at least 1,200 mg of calcium and at least 600 mg of vitamin D per day.  If you are older than age 52, get at least 1,200 mg of calcium and at least 800 mg of vitamin D per day.  Smoking and excessive  alcohol intake increase the risk of osteoporosis. Eat foods that are rich in calcium and vitamin D, and do weight-bearing exercises several times each week as directed by your health care provider. What should I know about how menopause affects my mental health? Depression may occur at any age, but it is more common as you become older. Common symptoms of depression include:  Low or sad mood.  Changes in sleep patterns.  Changes in appetite or eating patterns.  Feeling an overall lack of motivation or enjoyment of activities that you previously enjoyed.  Frequent crying spells.  Talk with your health care provider if you think that you are experiencing depression. What should I know about immunizations?  It is important that you get and maintain your immunizations. These include:  Tetanus, diphtheria, and pertussis (Tdap) booster vaccine.  Influenza every year before the flu season begins.  Pneumonia vaccine.  Shingles vaccine.  Your health care provider may also recommend other immunizations. This information is not intended to replace advice given to you by your health care provider. Make sure you discuss any questions you have with your health care provider. Document Released: 03/30/2005 Document Revised: 08/26/2015 Document Reviewed: 11/09/2014 Elsevier Interactive Patient Education  2018 [employer].

## 2016-11-01 ENCOUNTER — Encounter (HOSPITAL_COMMUNITY): Payer: Self-pay

## 2016-11-01 ENCOUNTER — Ambulatory Visit (HOSPITAL_COMMUNITY): Payer: 59

## 2016-11-02 DIAGNOSIS — R5383 Other fatigue: Secondary | ICD-10-CM | POA: Insufficient documentation

## 2016-11-02 LAB — CYTOLOGY - PAP
DIAGNOSIS: NEGATIVE
HPV: NOT DETECTED

## 2016-11-02 NOTE — Assessment & Plan Note (Signed)
Chronic, etiology unclear;  may be multifactorial.  Has been lost to follow up and thyroid level has not been checked in years.  Sleep study also ordered to investigate c/o snoring and gasping at night

## 2016-11-02 NOTE — Assessment & Plan Note (Signed)
Trial of triamcinolone bid to left axilla

## 2016-11-02 NOTE — Assessment & Plan Note (Signed)
She has had no recurrence since diagnosis in 2014. Continue annual mammogram and aromatase  Inhibitor therapy  

## 2016-11-02 NOTE — Assessment & Plan Note (Signed)
She is tolerating weekly alendronate , started in 2016

## 2016-11-02 NOTE — Assessment & Plan Note (Signed)
Treated with PTU for 2 years in 2009.  With resolution .  Repeat TSH is overdue   Lab Results  Component Value Date   TSH 1.08 02/02/2014

## 2016-11-02 NOTE — Assessment & Plan Note (Signed)
Improved and nearly resolved.

## 2016-11-02 NOTE — Assessment & Plan Note (Signed)
Chronic ,  Worsening with new onset rectal bleeding.  Marland Kitchen  Referral to darren wohl for colonoscopy

## 2016-11-02 NOTE — Assessment & Plan Note (Signed)
Chronic, with witnessed gasping at night and excessive daytime fatigue.  Refer for sleep study to rule out OSA

## 2016-11-02 NOTE — Assessment & Plan Note (Signed)
Annual comprehensive preventive exam was done as well as an evaluation and management of chronic conditions .  During the course of the visit the patient was educated and counseled about appropriate screening and preventive services including :  diabetes screening, lipid analysis with projected  10 year  risk for CAD , nutrition counseling, breast, cervical and colorectal cancer screening, and recommended immunizations.  Printed recommendations for health maintenance screenings was given.  PAP smear and breast exam done

## 2016-11-05 ENCOUNTER — Encounter: Payer: Self-pay | Admitting: *Deleted

## 2016-11-05 ENCOUNTER — Other Ambulatory Visit: Payer: Self-pay | Admitting: Hematology and Oncology

## 2016-11-08 ENCOUNTER — Other Ambulatory Visit: Payer: 59

## 2016-11-13 ENCOUNTER — Other Ambulatory Visit (INDEPENDENT_AMBULATORY_CARE_PROVIDER_SITE_OTHER): Payer: 59

## 2016-11-13 DIAGNOSIS — E559 Vitamin D deficiency, unspecified: Secondary | ICD-10-CM

## 2016-11-13 DIAGNOSIS — E7800 Pure hypercholesterolemia, unspecified: Secondary | ICD-10-CM

## 2016-11-13 DIAGNOSIS — R5383 Other fatigue: Secondary | ICD-10-CM

## 2016-11-13 LAB — VITAMIN D 25 HYDROXY (VIT D DEFICIENCY, FRACTURES): VITD: 56.2 ng/mL (ref 30.00–100.00)

## 2016-11-13 LAB — LIPID PANEL
CHOLESTEROL: 189 mg/dL (ref 0–200)
HDL: 43 mg/dL (ref >39.00)
LDL Cholesterol: 129 mg/dL — ABNORMAL HIGH (ref 0–99)
NonHDL: 146.11
TRIGLYCERIDES: 87 mg/dL (ref 0.0–149.0)
Total CHOL/HDL Ratio: 4
VLDL: 17.4 mg/dL (ref 0.0–40.0)

## 2016-11-13 LAB — TSH: TSH: 0.97 u[IU]/mL (ref 0.35–4.50)

## 2016-11-14 NOTE — Telephone Encounter (Signed)
Error

## 2016-11-15 NOTE — Telephone Encounter (Signed)
Error

## 2016-11-16 DIAGNOSIS — Z23 Encounter for immunization: Secondary | ICD-10-CM | POA: Diagnosis not present

## 2016-12-04 DIAGNOSIS — G4733 Obstructive sleep apnea (adult) (pediatric): Secondary | ICD-10-CM | POA: Diagnosis not present

## 2016-12-04 DIAGNOSIS — R0602 Shortness of breath: Secondary | ICD-10-CM | POA: Diagnosis not present

## 2016-12-05 ENCOUNTER — Encounter: Payer: Self-pay | Admitting: Gastroenterology

## 2016-12-05 ENCOUNTER — Ambulatory Visit (INDEPENDENT_AMBULATORY_CARE_PROVIDER_SITE_OTHER): Payer: 59 | Admitting: Gastroenterology

## 2016-12-05 ENCOUNTER — Other Ambulatory Visit: Payer: Self-pay

## 2016-12-05 VITALS — BP 110/71 | HR 63 | Temp 98.1°F | Ht 60.0 in | Wt 134.6 lb

## 2016-12-05 DIAGNOSIS — K625 Hemorrhage of anus and rectum: Secondary | ICD-10-CM

## 2016-12-05 DIAGNOSIS — R0602 Shortness of breath: Secondary | ICD-10-CM | POA: Diagnosis not present

## 2016-12-05 DIAGNOSIS — K5900 Constipation, unspecified: Secondary | ICD-10-CM | POA: Diagnosis not present

## 2016-12-05 DIAGNOSIS — G4733 Obstructive sleep apnea (adult) (pediatric): Secondary | ICD-10-CM | POA: Diagnosis not present

## 2016-12-05 MED ORDER — HYDROCORTISONE ACETATE 25 MG RE SUPP: 25.0000 mg | Freq: Every day | RECTAL | 0 refills | Status: AC

## 2016-12-05 NOTE — Progress Notes (Signed)
Jonathon Bellows MD, MRCP(U.K) [address]  [place], [place] [address]  Main: [phone]  Fax: [phone]   Gastroenterology Consultation  Referring Provider:     [name], MD Primary Care Physician:  [name], MD Primary Gastroenterologist:  Dr. Jonathon Bellows  Reason for Consultation:     Constipation ,rectal bleeding         HPI:   Patricia Crane is a 46 y.o. y/o female referred for consultation & management  by Dr. Derrel Nip, Aris Everts, MD.    She says that she has had constipation about 2.5 years back , over the past year it has got worse, with miralax has improved a "whole lot".   While on miralax has a bowel movement once a day , soft in nature. No issues with the miralax. Started taking it in August. She has had some rectal bleeding . On and off, has had issues with hemorrhoids since age 30 . Feels worse when she is constipated and when she strains. She feels there are some changes which makes her feel that "its dropping down". Notices bright red blood on the toilet paper. Anal itching when she wipes more. Some change in shape of her stool . On miralax better. No family history of colon cancer or polyps. Never had a colonoscopy.   Prior history of anal intercourse recently. No children .     Past Medical History:  Diagnosis Date  . Breast cancer ([place]) 2014   Left  . Cancer [facility]) May 2014   Breast  left- radiation  . Chicken pox   . Heart murmur   . Shingles   . Thyroid disease     Past Surgical History:  Procedure Laterality Date  . BREAST BIOPSY Left 2014   +  . BREAST BIOPSY Right 04/22/2013   neg- core  . BREAST LUMPECTOMY Left 2014  . lasix Bilateral     Prior to Admission medications   Medication Sig Start Date End Date Taking? Authorizing Provider  alendronate (FOSAMAX) 70 MG tablet TAKE 1 TABLET BY MOUTH ONCE A WEEK ON AN EMPTY STOMACH WITH FULL GLASS OF WATER 08/29/16  Yes Corcoran, Drue Second, MD  ALPRAZolam (XANAX) 0.25 MG  tablet Take 1 tablet (0.25 mg total) by mouth 2 (two) times daily as needed for sleep or anxiety. 02/18/13  Yes [name], MD  calcium carbonate (OS-CAL) 600 MG TABS tablet Take 600 mg by mouth 2 (two) times daily with a meal.   Yes [provider]  Cholecalciferol (D3 SUPER STRENGTH) 2000 UNITS CAPS Take by mouth daily.   Yes [provider]  exemestane (AROMASIN) 25 MG tablet TAKE 1 TABLET BY MOUTH DAILY AFTER BREAKFAST. 04/26/16  Yes Corcoran, Melissa C, MD  Melatonin-Pyridoxine (MELATIN) 3-1 MG TABS Take 3 mg by mouth at bedtime.   Yes [provider]  Misc Natural Products (OSTEO BI-FLEX ADV DOUBLE ST PO) Take by mouth.   Yes [provider]  Multiple Vitamins-Minerals (MULTIVITAMIN WITH MINERALS) tablet Take 1 tablet by mouth daily.   Yes [provider]  Probiotic Product (ALIGN) 4 MG CAPS Take 4 mg by mouth daily.   Yes [provider]  triamcinolone cream (KENALOG) 0.1 % Apply 1 application topically 2 (two) times daily. 10/31/16  Yes [name], MD  valACYclovir (VALTREX) 500 MG tablet Take 1 tablet (500 mg total) by mouth daily. 05/04/15  Yes [name], MD  methocarbamol (ROBAXIN) 500 MG  tablet  02/21/14   [provider]    Family History  Problem Relation Age of Onset  . Hyperlipidemia Mother   . Heart disease Mother   . Dementia Mother 24       alzheimers  . Cancer Father        tonsil ca  . Hyperlipidemia Sister   . Hyperlipidemia Brother   . Heart disease Maternal Aunt   . Hyperlipidemia Maternal Aunt   . Hypertension Maternal Aunt   . Heart disease Maternal Uncle   . Hyperlipidemia Maternal Uncle   . Stroke Maternal Uncle   . Hypertension Maternal Uncle   . Cancer Paternal Aunt 35       ovarian ca  . Drug abuse Paternal Aunt   . Cancer Paternal Uncle        lung CA ,  smoker  . Breast cancer Neg Hx      Social History  Substance Use Topics  . Smoking status: Former Smoker    Packs/day:  2.00    Types: Cigarettes    Quit date: 06/10/2003  . Smokeless tobacco: Never Used     Comment: social smoker  . Alcohol use Yes     Comment: occasionally    Allergies as of 12/05/2016  . (No Known Allergies)    Review of Systems:    All systems reviewed and negative except where noted in HPI.   Physical Exam:  BP 110/71 (BP Location: Right Arm, Patient Position: Sitting, Cuff Size: Normal)   Pulse 63   Temp 98.1 F (36.7 C) (Oral)   Ht 5' (1.524 m)   Wt 134 lb 9.6 oz (61.1 kg)   LMP 12/17/2012   BMI 26.29 kg/m  Patient's last menstrual period was 12/17/2012. Psych:  Alert and cooperative. Normal mood and affect. General:   Alert,  Well-developed, well-nourished, pleasant and cooperative in NAD Head:  Normocephalic and atraumatic. Eyes:  Sclera clear, no icterus.   Conjunctiva pink. Ears:  Normal auditory acuity. Nose:  No deformity, discharge, or lesions. Mouth:  No deformity or lesions,oropharynx pink & moist. Neck:  Supple; no masses or thyromegaly. Lungs:  Respirations even and unlabored.  Clear throughout to auscultation.   No wheezes, crackles, or rhonchi. No acute distress. Heart:  Regular rate and rhythm; no murmurs, clicks, rubs, or gallops. Abdomen:  Normal bowel sounds.  No bruits.  Soft, non-tender and non-distended without masses, hepatosplenomegaly or hernias noted.  No guarding or rebound tenderness.    Extremities:  No clubbing or edema.  No cyanosis. Neurologic:  Alert and oriented x3;  grossly normal neurologically. Skin:  Intact without significant lesions or rashes. No jaundice. Lymph Nodes:  No significant cervical adenopathy. Psych:  Alert and cooperative. Normal mood and affect.  Imaging Studies: No results found.  Assessment and Plan:   Patricia Crane is a 46 y.o. y/o female has been referred for rectal bleeding and constipation . History suggestive of bleeding from hemorrhoids. She will need a colonoscopy for evaluation . Counseled on high  fiber diet and hemorroid care.  Plan: 1. Trial of Anusol  2. High fiber diet  3. Miralax PRN 4. Patient information provided on high fiber diet and hemorrhoids care 5. Diagnostic colonoscopy for rectal bleeding   I have discussed alternative options, risks & benefits,  which include, but are not limited to, bleeding, infection, perforation,respiratory complication & drug reaction.  The patient agrees with this plan & written consent will be obtained.     Follow  up in 8 weeks   Dr Jonathon Bellows MD,MRCP(U.K)

## 2016-12-06 NOTE — Addendum Note (Signed)
Addended by: Peggye Ley on: 12/06/2016 04:24 PM   Modules accepted: Miquel Dunn

## 2016-12-12 ENCOUNTER — Other Ambulatory Visit: Payer: Self-pay | Admitting: Hematology and Oncology

## 2016-12-29 ENCOUNTER — Other Ambulatory Visit: Payer: Self-pay | Admitting: Nurse Practitioner

## 2017-01-08 ENCOUNTER — Telehealth: Payer: Self-pay | Admitting: Gastroenterology

## 2017-01-08 NOTE — Telephone Encounter (Signed)
Patient called and wants to cancel Colonoscopy for 01/18/17. Will call back later to reschedule.

## 2017-01-18 ENCOUNTER — Ambulatory Visit: Admission: RE | Admit: 2017-01-18 | Payer: 59 | Source: Ambulatory Visit | Admitting: Gastroenterology

## 2017-01-18 ENCOUNTER — Encounter: Admission: RE | Payer: Self-pay | Source: Ambulatory Visit

## 2017-01-18 SURGERY — COLONOSCOPY WITH PROPOFOL
Anesthesia: General

## 2017-03-13 ENCOUNTER — Other Ambulatory Visit: Payer: Self-pay | Admitting: Urgent Care

## 2017-03-13 DIAGNOSIS — C50919 Malignant neoplasm of unspecified site of unspecified female breast: Secondary | ICD-10-CM

## 2017-03-13 NOTE — Telephone Encounter (Signed)
  OK to refill Fosamax.  If her bone density study is worse in 04/2017, we will need to change to Prolia.  M

## 2017-04-03 ENCOUNTER — Telehealth: Payer: Self-pay | Admitting: *Deleted

## 2017-04-03 NOTE — Telephone Encounter (Signed)
Called patient to make her aware of the time  of her appt time being moved up due to MD only working a half a day on 04/23/17. Made her aware of Lab/MD time.

## 2017-04-04 ENCOUNTER — Ambulatory Visit: Payer: 59 | Admitting: Hematology and Oncology

## 2017-04-04 ENCOUNTER — Other Ambulatory Visit: Payer: 59

## 2017-04-22 ENCOUNTER — Other Ambulatory Visit: Payer: Self-pay | Admitting: *Deleted

## 2017-04-23 ENCOUNTER — Encounter: Payer: Self-pay | Admitting: Hematology and Oncology

## 2017-04-23 ENCOUNTER — Ambulatory Visit: Payer: 59 | Admitting: Hematology and Oncology

## 2017-04-23 ENCOUNTER — Inpatient Hospital Stay: Payer: 59 | Attending: Hematology and Oncology

## 2017-04-23 ENCOUNTER — Inpatient Hospital Stay: Payer: 59 | Admitting: Hematology and Oncology

## 2017-04-23 VITALS — BP 113/74 | HR 73 | Temp 97.7°F | Resp 18 | Wt 131.4 lb

## 2017-04-23 DIAGNOSIS — R945 Abnormal results of liver function studies: Secondary | ICD-10-CM | POA: Diagnosis not present

## 2017-04-23 DIAGNOSIS — M810 Age-related osteoporosis without current pathological fracture: Secondary | ICD-10-CM

## 2017-04-23 DIAGNOSIS — C50912 Malignant neoplasm of unspecified site of left female breast: Secondary | ICD-10-CM

## 2017-04-23 DIAGNOSIS — R7989 Other specified abnormal findings of blood chemistry: Secondary | ICD-10-CM

## 2017-04-23 DIAGNOSIS — M818 Other osteoporosis without current pathological fracture: Secondary | ICD-10-CM

## 2017-04-23 DIAGNOSIS — C50919 Malignant neoplasm of unspecified site of unspecified female breast: Secondary | ICD-10-CM

## 2017-04-23 LAB — COMPREHENSIVE METABOLIC PANEL
ALT: 62 U/L — ABNORMAL HIGH (ref 14–54)
AST: 29 U/L (ref 15–41)
Albumin: 4.3 g/dL (ref 3.5–5.0)
Alkaline Phosphatase: 64 U/L (ref 38–126)
Anion gap: 7 (ref 5–15)
BUN: 13 mg/dL (ref 6–20)
CO2: 26 mmol/L (ref 22–32)
Calcium: 9.1 mg/dL (ref 8.9–10.3)
Chloride: 106 mmol/L (ref 101–111)
Creatinine, Ser: 0.75 mg/dL (ref 0.44–1.00)
GFR calc Af Amer: >60 mL/min (ref >60)
GFR calc non Af Amer: >60 mL/min (ref >60)
Glucose, Bld: 112 mg/dL — ABNORMAL HIGH (ref 65–99)
Potassium: 4.1 mmol/L (ref 3.5–5.1)
Sodium: 139 mmol/L (ref 135–145)
Total Bilirubin: 0.5 mg/dL (ref 0.3–1.2)
Total Protein: 7.6 g/dL (ref 6.5–8.1)

## 2017-04-23 LAB — CBC WITH DIFFERENTIAL/PLATELET
Basophils Absolute: 0 10*3/uL (ref 0–0.1)
Basophils Relative: 1 %
Eosinophils Absolute: 0.1 10*3/uL (ref 0–0.7)
Eosinophils Relative: 2 %
HCT: 41.1 % (ref 35.0–47.0)
Hemoglobin: 13.7 g/dL (ref 12.0–16.0)
Lymphocytes Relative: 38 %
Lymphs Abs: 1.9 10*3/uL (ref 1.0–3.6)
MCH: 31.5 pg (ref 26.0–34.0)
MCHC: 33.5 g/dL (ref 32.0–36.0)
MCV: 94.1 fL (ref 80.0–100.0)
Monocytes Absolute: 0.4 10*3/uL (ref 0.2–0.9)
Monocytes Relative: 7 %
Neutro Abs: 2.7 10*3/uL (ref 1.4–6.5)
Neutrophils Relative %: 52 %
Platelets: 283 10*3/uL (ref 150–440)
RBC: 4.37 MIL/uL (ref 3.80–5.20)
RDW: 13.3 % (ref 11.5–14.5)
WBC: 5.2 10*3/uL (ref 3.6–11.0)

## 2017-04-23 NOTE — Progress Notes (Signed)
Patient offers no complaints today. 

## 2017-04-23 NOTE — Progress Notes (Signed)
[facility] day:  04/23/17  Chief Complaint: Patricia Crane is a 47 y.o. female with stage IIA left breast cancer who is seen for 6 month assessment.  HPI: The patient was last seen in the medical oncology clinic on 10/04/2016.  At that time, she was doing well overall. She had some constipation. Exam was stable. CA27.29 was normal.  During the interim, she has felt "pretty good".  She has traveled to [place], [place], and [place].  She denied any illnesses while away except for a slight sore throat. Constipation is better with Miralax.  She denies any breast concerns.   Past Medical History:  Diagnosis Date  . Breast cancer ([place]) 2014   Left  . Cancer [facility]) May 2014   Breast  left- radiation  . Chicken pox   . Heart murmur   . Shingles   . Thyroid disease     Past Surgical History:  Procedure Laterality Date  . BREAST BIOPSY Left 2014   +  . BREAST BIOPSY Right 04/22/2013   neg- core  . BREAST LUMPECTOMY Left 2014  . lasix Bilateral     Family History  Problem Relation Age of Onset  . Hyperlipidemia Mother   . Heart disease Mother   . Dementia Mother 27       alzheimers  . Cancer Father        tonsil ca  . Hyperlipidemia Sister   . Hyperlipidemia Brother   . Heart disease Maternal Aunt   . Hyperlipidemia Maternal Aunt   . Hypertension Maternal Aunt   . Heart disease Maternal Uncle   . Hyperlipidemia Maternal Uncle   . Stroke Maternal Uncle   . Hypertension Maternal Uncle   . Cancer Paternal Aunt 52       ovarian ca  . Drug abuse Paternal Aunt   . Cancer Paternal Uncle        lung CA ,  smoker  . Breast cancer Neg Hx     Social History:  reports that she quit smoking about 13 years ago. Her smoking use included cigarettes. She smoked 2.00 packs per day. she has never used smokeless tobacco. She reports that she drinks alcohol. She reports that she does not use drugs.  She is from [place].  She has just returned  from a trip to [place], [place], and [place]. She works in the [employer]. The patient is alone today.  Allergies: No Known Allergies  Current Medications: Current Outpatient Medications  Medication Sig Dispense Refill  . alendronate (FOSAMAX) 70 MG tablet TAKE 1 TABLET BY MOUTH ONCE A WEEK ON AN EMPTY STOMACH WITH FULL GLASS OF WATER 30 tablet 3  . ALPRAZolam (XANAX) 0.25 MG tablet Take 1 tablet (0.25 mg total) by mouth 2 (two) times daily as needed for sleep or anxiety. 60 tablet 2  . calcium carbonate (OS-CAL) 600 MG TABS tablet Take 600 mg by mouth 2 (two) times daily with a meal.    . Cholecalciferol (D3 SUPER STRENGTH) 2000 UNITS CAPS Take by mouth daily.    Marland Kitchen exemestane (AROMASIN) 25 MG tablet TAKE 1 TABLET BY MOUTH DAILY AFTER BREAKFAST. 30 tablet 6  . Melatonin-Pyridoxine (MELATIN) 3-1 MG TABS Take 3 mg by mouth at bedtime.    . methocarbamol (ROBAXIN) 500 MG tablet     . Misc Natural Products (OSTEO BI-FLEX ADV DOUBLE ST PO) Take by mouth.    . Multiple Vitamins-Minerals (MULTIVITAMIN WITH MINERALS) tablet Take  1 tablet by mouth daily.    . Probiotic Product (ALIGN) 4 MG CAPS Take 4 mg by mouth daily.    Marland Kitchen triamcinolone cream (KENALOG) 0.1 % Apply 1 application topically 2 (two) times daily. 30 g 2  . valACYclovir (VALTREX) 500 MG tablet Take 1 tablet (500 mg total) by mouth daily. 30 tablet 11   No current facility-administered medications for this visit.     Review of Systems:  GENERAL:  Feels "pretty good".  No fevers or sweats.  Weight down 3 pounds. PERFORMANCE STATUS (ECOG):  0 HEENT:  No visual changes, runny nose, sore throat, mouth sores or tenderness. Lungs: No shortness of breath or cough.  No hemoptysis. Cardiac:  No chest pain, palpitations, orthopnea, or PND. GI:   Constipation on Miralax. Hemorrhoids. No nausea, vomiting, diarrhea, melena or hematochezia.  She is planning a colonoscopy. GU:  No urgency, frequency, dysuria, or  hematuria. Musculoskeletal:  No back pain.  Intermittent joint pain in feet, ankles, knees and shoulders (see HPI).  No muscle tenderness. Extremities:  No pain or swelling. Skin:  No skin issues. Neuro:  No headache, numbness or weakness, balance or coordination issues. Endocrine:  No diabetes, thyroid issues, hot flashes or night sweats. Psych:  No mood changes, depression or anxiety. Pain:  No focal pain. Review of systems:  All other systems reviewed and found to be negative.  Physical Exam: Blood pressure 113/74, pulse 73, temperature 97.7 F (36.5 C), temperature source Tympanic, resp. rate 18, weight 131 lb 6 oz (59.6 kg), last menstrual period 12/17/2012. GENERAL:  Well developed, well nourished, woman sitting comfortably in the exam room in no acute distress. MENTAL STATUS:  Alert and oriented to person, place and time. HEAD:  Long black hair.  Normocephalic, atraumatic, face symmetric, no Cushingoid features. EYES:  Brown eyes.  Pupils equal round and reactive to light and accomodation.  No conjunctivitis or scleral icterus. ENT:  Oropharynx clear without lesion.  Tongue normal. Mucous membranes moist.  RESPIRATORY:  Clear to auscultation without rales, wheezes or rhonchi. CARDIOVASCULAR:  Regular rate and rhythm without murmur, rub or gallop.  BREAST:  Right breast without masses, skin changes or nipple discharge.  Fibrocystic changes at the 9 o'clock position.  Post-operative changes in the left breast, tender.  Fibrocystic changes medially.  No masses, skin changes or nipple discharge.  ABDOMEN:  Soft, non-tender, with active bowel sounds, and no hepatosplenomegaly.  No masses. SKIN:  Changes on skin due to cupping.   EXTREMITIES:  No lower extremity edema, no skin discoloration or tenderness.  No palpable cords. LYMPH NODES: No palpable cervical, supraclavicular, axillary or inguinal adenopathy  NEUROLOGICAL: Unremarkable. PSYCH:  Appropriate.   Appointment on 04/23/2017   Component Date Value Ref Range Status  . Estradiol 04/23/2017 <5.0  pg/mL Final   Comment: (NOTE)                    Adult Female:                      Follicular phase   26.9 -   166.0                      Ovulation phase    85.8 -   498.0                      Luteal phase       43.8 -   211.0  Postmenopausal     <6.0 -    54.7                    Pregnancy                      1st trimester     215.0 - >4300.0                    Girls (1-10 years)    6.0 -    27.0 Roche ECLIA methodology Performed At: [facility] [place], [place] [id number] Rush Farmer MD [id number] Performed at [facility], [address] [place], [place] [address]   . [place] 04/23/2017 27.2  mIU/mL Final   Comment: (NOTE)                    Adult Female:                      Follicular phase      3.5 -  12.5                      Ovulation phase       4.7 -  21.5                      Luteal phase          1.7 -   7.7                      Postmenopausal       25.8 - 134.8 Performed At: [facility] [place], [place] [id number] Rush Farmer MD [id number] Performed at [facility], [address] [place], [place] [address]   . CA 27.29 04/23/2017 21.6  0.0 - 38.6 U/mL Final   Comment: (NOTE) Siemens Centaur Immunochemiluminometric Methodology [facility]) Values obtained with different assay methods or kits cannot be used interchangeably. Results cannot be interpreted as absolute evidence of the presence or absence of malignant disease. Performed At: [facility] [place], [place] [id number] Rush Farmer MD [id number] Performed at [facility], [address], [place],  [address]   . Sodium 04/23/2017 139  135 - 145 mmol/L Final  . Potassium 04/23/2017 4.1  3.5 - 5.1 mmol/L Final  . Chloride 04/23/2017 106  101 - 111 mmol/L Final  . CO2 04/23/2017 26  22 - 32 mmol/L Final  .  Glucose, Bld 04/23/2017 112* 65 - 99 mg/dL Final  . BUN 04/23/2017 13  6 - 20 mg/dL Final  . Creatinine, Ser 04/23/2017 0.75  0.44 - 1.00 mg/dL Final  . Calcium 04/23/2017 9.1  8.9 - 10.3 mg/dL Final  . Total Protein 04/23/2017 7.6  6.5 - 8.1 g/dL Final  . Albumin 04/23/2017 4.3  3.5 - 5.0 g/dL Final  . AST 04/23/2017 29  15 - 41 U/L Final  . ALT 04/23/2017 62* 14 - 54 U/L Final  . Alkaline Phosphatase 04/23/2017 64  38 - 126 U/L Final  . Total Bilirubin 04/23/2017 0.5  0.3 - 1.2 mg/dL Final  . GFR calc non Af Amer 04/23/2017 >60  >60 mL/min Final  . GFR calc Af Amer 04/23/2017 >60  >60 mL/min Final   Comment: (NOTE) The eGFR has been calculated using the CKD EPI equation. This calculation has not been validated in all clinical situations.  eGFR's persistently <60 mL/min signify possible Chronic Kidney Disease.   Georgiann Hahn gap 04/23/2017 7  5 - 15 Final   Performed at [facility], [address] [place], [place] [address]  . WBC 04/23/2017 5.2  3.6 - 11.0 K/uL Final  . RBC 04/23/2017 4.37  3.80 - 5.20 MIL/uL Final  . Hemoglobin 04/23/2017 13.7  12.0 - 16.0 g/dL Final  . HCT 04/23/2017 41.1  35.0 - 47.0 % Final  . MCV 04/23/2017 94.1  80.0 - 100.0 fL Final  . MCH 04/23/2017 31.5  26.0 - 34.0 pg Final  . MCHC 04/23/2017 33.5  32.0 - 36.0 g/dL Final  . RDW 04/23/2017 13.3  11.5 - 14.5 % Final  . Platelets 04/23/2017 283  150 - 440 K/uL Final  . Neutrophils Relative % 04/23/2017 52  % Final  . Neutro Abs 04/23/2017 2.7  1.4 - 6.5 K/uL Final  . Lymphocytes Relative 04/23/2017 38  % Final  . Lymphs Abs 04/23/2017 1.9  1.0 - 3.6 K/uL Final  . Monocytes Relative 04/23/2017 7  % Final  . Monocytes Absolute 04/23/2017 0.4  0.2 - 0.9 K/uL Final  . Eosinophils Relative 04/23/2017 2  % Final  . Eosinophils Absolute 04/23/2017 0.1  0 - 0.7 K/uL Final  . Basophils Relative 04/23/2017 1  % Final  . Basophils Absolute 04/23/2017 0.0  0 - 0.1 K/uL Final   Performed at [facility],  [address] [place], [place] [address]    Assessment:  Patricia Crane is a 47 y.o. female stage IIA left breast cancer s/p partial mastectomy and sentinel lymph node biopsy on 08/18/2012.  Pathology revealed a 2.5 cm grade III invasive ductal carcinoma of the breast.  Margins were uninvolved, but close (5 mm).  DCIS was present.  One sentinel lymph node was negative.  Tumor was ER + (60%), PR + (25%) and Her2/neu 2+ (negative by FISH).  Pathologic stage was T2N0M0.    Oncotype DX score was 24 which translated to a 16% risk of distant recurrence at 10 years with tamoxifen alone (confidence interval 12-19%).  She declined systemic chemotherapy.  She received radiation.  She was started on [facility] agonist) and Aromasin in 12/2012.  She had a reaction (inflammation at the injection site, swelling and fever). She was switched to Lupron (few joint aches and headache).  Last Lupron injection was in 08/2014.  She remains on Aromasin.   She has an abnormal right mammogram on 05/2013.  Biopsy was negative.  Breast MRI on 09/24/2014 revealed expected post-operative changes in the left breast and no evidence of malignancy.  Bilateral mammogram on 05/02/2015 and 05/04/2016 revealed no evidence of malignancy.  She states that annual breast MRI was recommended per radiology.  CA 27.29 was 28.2 on 11/26/2013, 23.2 on 12/01/2014,  27.6 on 10/06/2015, 24.5 on 04/05/2016, 23.3 on 10/04/2016, and 21.6 on 04/23/2017.  She has been in menopause.  She has not had a menses for 2 years.  Estradiol was < 5 and FSH 8.2 on 01/12/2015.  Estradiol was 7.6 and FSH 7.6 on 10/06/2015.  Estradiol was 9.0 and FSH 23.9 on 04/05/2016.  Estradiol was 10.6 and FSH 26.2 on 08/16//2018.  Estradiol was < 5 and FSH 27.2 on 04/23/2017.  Bone density study on 05/02/2015 revealed osteoporosis with a T-score of -2.8 in the AP spine.  She is on Fosamax, calcium, and vitamin D.  Symptomatically, she is doing well.  Exam stable.  Liver  function tests  are mildly elevated (ALT is 62).  Plan: 1.  Labs today:  CBC with diff, CMP, CA27.29, FSH, estradiol. 2.  Schedule bilateral mammogram on 05/06/2017. 3.  Schedule bone density on 05/06/2017. 4.  Discuss BCI testing.  Patient to call back if she would like to pursue. 5.  Continue Aromasin. 6.  Discuss mildly elevated LFTs.  Recheck in 2 weeks. 7.  Discuss plan for breast MRI in 10/2017 or 11/2017. 8.  RTC in 2 weeks for labs (LFTs) 9.  RTC in 6 months for MD assessment, labs (CBC with diff, CMP, CA27.29), and review imaging studies.   Lequita Asal, MD   04/23/2017, 4:35 PM

## 2017-04-24 LAB — ESTRADIOL: Estradiol: <5 pg/mL

## 2017-04-24 LAB — CANCER ANTIGEN 27.29: CA 27.29: 21.6 U/mL (ref 0.0–38.6)

## 2017-04-24 LAB — FOLLICLE STIMULATING HORMONE: FSH: 27.2 m[IU]/mL

## 2017-04-25 ENCOUNTER — Telehealth: Payer: Self-pay | Admitting: *Deleted

## 2017-04-25 NOTE — Telephone Encounter (Signed)
Per patient called to request to Reschedule her 05/07/17 lab appt due to having other appts on that day. Date rescheduled to 05/09/17.

## 2017-05-07 ENCOUNTER — Other Ambulatory Visit: Payer: 59

## 2017-05-09 ENCOUNTER — Inpatient Hospital Stay: Payer: 59

## 2017-05-09 DIAGNOSIS — C50912 Malignant neoplasm of unspecified site of left female breast: Secondary | ICD-10-CM

## 2017-05-09 LAB — HEPATIC FUNCTION PANEL
ALT: 18 U/L (ref 14–54)
AST: 20 U/L (ref 15–41)
Albumin: 4.4 g/dL (ref 3.5–5.0)
Alkaline Phosphatase: 69 U/L (ref 38–126)
Bilirubin, Direct: 0.1 mg/dL (ref 0.1–0.5)
Indirect Bilirubin: 0.7 mg/dL (ref 0.3–0.9)
Total Bilirubin: 0.8 mg/dL (ref 0.3–1.2)
Total Protein: 7.7 g/dL (ref 6.5–8.1)

## 2017-05-21 ENCOUNTER — Ambulatory Visit
Admission: RE | Admit: 2017-05-21 | Discharge: 2017-05-21 | Disposition: A | Discharge status: Home / Self Care | Payer: 59 | Source: Ambulatory Visit | Attending: Hematology and Oncology | Admitting: Hematology and Oncology

## 2017-05-21 DIAGNOSIS — M818 Other osteoporosis without current pathological fracture: Secondary | ICD-10-CM | POA: Insufficient documentation

## 2017-05-21 DIAGNOSIS — Z08 Encounter for follow-up examination after completed treatment for malignant neoplasm: Secondary | ICD-10-CM | POA: Insufficient documentation

## 2017-05-21 DIAGNOSIS — C50912 Malignant neoplasm of unspecified site of left female breast: Secondary | ICD-10-CM | POA: Diagnosis present

## 2017-05-21 DIAGNOSIS — M810 Age-related osteoporosis without current pathological fracture: Secondary | ICD-10-CM | POA: Diagnosis not present

## 2017-05-21 DIAGNOSIS — Z853 Personal history of malignant neoplasm of breast: Secondary | ICD-10-CM | POA: Insufficient documentation

## 2017-05-21 DIAGNOSIS — R922 Inconclusive mammogram: Secondary | ICD-10-CM | POA: Diagnosis not present

## 2017-05-21 HISTORY — DX: Personal history of irradiation: Z92.3

## 2017-05-21 HISTORY — DX: Unspecified malignant neoplasm of skin, unspecified: C44.90

## 2017-05-22 ENCOUNTER — Telehealth: Payer: Self-pay | Admitting: *Deleted

## 2017-05-22 NOTE — Telephone Encounter (Signed)
Called patient to inform her that her osteoporosis is unchanged.  Patient may benefit from different therapy.  Patient would like to set up appointment to discuss.  She also informed me she called her insurance company today regarding BCI testing.  They will cover the cost, however, will need preauthorization from MD.  Will have scheduling call with new appointment.

## 2017-05-22 NOTE — Telephone Encounter (Signed)
-----   Message from Lequita Asal, MD sent at 05/21/2017  2:11 PM EDT ----- Regarding: Please call patient  Bone density study- osteoporosis.  No improvement.  Consider change in therapy.  May need to have follow-up visit to discuss.  M

## 2017-06-17 DIAGNOSIS — C50912 Malignant neoplasm of unspecified site of left female breast: Secondary | ICD-10-CM | POA: Diagnosis not present

## 2017-06-18 ENCOUNTER — Encounter: Payer: Self-pay | Admitting: Hematology and Oncology

## 2017-06-28 ENCOUNTER — Encounter: Payer: Self-pay | Admitting: Hematology and Oncology

## 2017-07-02 ENCOUNTER — Inpatient Hospital Stay: Payer: 59 | Attending: Hematology and Oncology | Admitting: Hematology and Oncology

## 2017-07-02 ENCOUNTER — Encounter: Payer: Self-pay | Admitting: Hematology and Oncology

## 2017-07-02 ENCOUNTER — Other Ambulatory Visit: Payer: Self-pay

## 2017-07-02 DIAGNOSIS — C50912 Malignant neoplasm of unspecified site of left female breast: Secondary | ICD-10-CM

## 2017-07-02 DIAGNOSIS — M810 Age-related osteoporosis without current pathological fracture: Secondary | ICD-10-CM | POA: Diagnosis not present

## 2017-07-02 DIAGNOSIS — Z170 Estrogen receptor positive status [ER+]: Secondary | ICD-10-CM | POA: Insufficient documentation

## 2017-07-02 DIAGNOSIS — I890 Lymphedema, not elsewhere classified: Secondary | ICD-10-CM

## 2017-07-02 DIAGNOSIS — Z87891 Personal history of nicotine dependence: Secondary | ICD-10-CM | POA: Insufficient documentation

## 2017-07-02 DIAGNOSIS — M818 Other osteoporosis without current pathological fracture: Secondary | ICD-10-CM

## 2017-07-02 MED ORDER — LETROZOLE 2.5 MG PO TABS: 2.5000 mg | ORAL_TABLET | Freq: Every day | ORAL | 5 refills | Status: DC

## 2017-07-02 NOTE — Progress Notes (Signed)
[facility] day:  07/02/2017   Chief Complaint: Patricia Crane is a 47 y.o. female with stage IIA left breast cancer who is seen for review of imaging and BCI testing.  HPI: The patient was last seen in the medical oncology clinic on 04/23/2017.  At that time, she was doing well.  Exam was stable.  Liver function tests were mildly elevated (ALT is 62).  Repeat LFTs on 05/09/2017 were normal.  Bilateral mammogram on 05/21/2017 revealed no evidence of malignancy.  Bone density study on 05/21/2017 revealed osteoporosis with a T-score of -2.8 in AP spine L1-L4.  BCI testing revealed a 12.7% risk of late recurrence a( years 5-10) and a high likelihood of benefit.  During the interim, she has felt well.  She denies any concerns.  She is planning to have a colonoscopy this year.  She notes Aromasin costs $50/month.  She is wondering about other options.   Past Medical History:  Diagnosis Date  . Breast cancer ([place]) 2014    breast invasive mammary carcinoma  . Chicken pox   . Heart murmur   . Personal history of radiation therapy 2014   left breast ca  . Shingles   . Skin cancer   . Thyroid disease     Past Surgical History:  Procedure Laterality Date  . BREAST BIOPSY Left 06/2012   invasive mammary  . BREAST BIOPSY Right 04/22/2013   neg- core  . BREAST LUMPECTOMY Left 08/18/2012   invasive mammary carcinoma. Margins close < 0.74m but negative, LN negative  . lasix Bilateral     Family History  Problem Relation Age of Onset  . Hyperlipidemia Mother   . Heart disease Mother   . Dementia Mother 712      alzheimers  . Cancer Father        tonsil ca  . Hyperlipidemia Sister   . Hyperlipidemia Brother   . Heart disease Maternal Aunt   . Hyperlipidemia Maternal Aunt   . Hypertension Maternal Aunt   . Heart disease Maternal Uncle   . Hyperlipidemia Maternal Uncle   . Stroke Maternal Uncle   . Hypertension Maternal Uncle   .  Cancer Paternal Aunt 533      ovarian ca  . Drug abuse Paternal Aunt   . Cancer Paternal Uncle        lung CA ,  smoker  . Breast cancer Neg Hx     Social History:  reports that she quit smoking about 14 years ago. Her smoking use included cigarettes. She smoked 2.00 packs per day. She has never used smokeless tobacco. She reports that she drinks alcohol. She reports that she does not use drugs.  She is from [place]  She has just returned from a trip to [place] She works in the [employer] The patient is alone today.  Allergies: No Known Allergies  Current Medications: Current Outpatient Medications  Medication Sig Dispense Refill  . alendronate (FOSAMAX) 70 MG tablet TAKE 1 TABLET BY MOUTH ONCE A WEEK ON AN EMPTY STOMACH WITH FULL GLASS OF WATER 30 tablet 3  . ALPRAZolam (XANAX) 0.25 MG tablet Take 1 tablet (0.25 mg total) by mouth 2 (two) times daily as needed for sleep or anxiety. 60 tablet 2  . calcium carbonate (OS-CAL) 600 MG TABS tablet Take 600 mg by mouth 2 (two) times daily with a meal.    . Cholecalciferol (D3 SUPER STRENGTH)  2000 UNITS CAPS Take by mouth daily.    Marland Kitchen exemestane (AROMASIN) 25 MG tablet TAKE 1 TABLET BY MOUTH DAILY AFTER BREAKFAST. 30 tablet 6  . Melatonin-Pyridoxine (MELATIN) 3-1 MG TABS Take 3 mg by mouth at bedtime.    . Misc Natural Products (OSTEO BI-FLEX ADV DOUBLE ST PO) Take by mouth.    . Multiple Vitamins-Minerals (MULTIVITAMIN WITH MINERALS) tablet Take 1 tablet by mouth daily.    . Probiotic Product (ALIGN) 4 MG CAPS Take 4 mg by mouth daily.    Marland Kitchen triamcinolone cream (KENALOG) 0.1 % Apply 1 application topically 2 (two) times daily. 30 g 2  . letrozole (FEMARA) 2.5 MG tablet Take 1 tablet (2.5 mg total) by mouth daily. 30 tablet 5  . methocarbamol (ROBAXIN) 500 MG tablet      No current facility-administered medications for this visit.     Review of Systems:  GENERAL:  Feels well.  No fevers, sweats.  Weight down  2 pounds. PERFORMANCE STATUS (ECOG):  0 HEENT:  No visual changes, runny nose, sore throat, mouth sores or tenderness. Lungs: No shortness of breath or cough.  No hemoptysis. Cardiac:  No chest pain, palpitations, orthopnea, or PND. GI:  No nausea, vomiting, diarrhea, constipation, melena or hematochezia.  Colonoscopy planned for this year. GU:  No urgency, frequency, dysuria, or hematuria. Musculoskeletal:  No back pain.  No joint pain.  No muscle tenderness. Extremities:  No pain or swelling. Skin:  No rashes or skin changes. Neuro:  No headache, numbness or weakness, balance or coordination issues. Endocrine:  No diabetes, thyroid issues, hot flashes or night sweats. Psych:  No mood changes, depression or anxiety. Pain:  No focal pain. Review of systems:  All other systems reviewed and found to be negative.   Physical Exam: Blood pressure 119/77, pulse 99, temperature 98.2 F (36.8 C), temperature source Tympanic, resp. rate 18, weight 129 lb 1.6 oz (58.6 kg), last menstrual period 12/17/2012. GENERAL:  Well developed, well nourished, woman sitting comfortably in the exam room in no acute distress. MENTAL STATUS:  Alert and oriented to person, place and time. HEAD:  Long black hair.  Normocephalic, atraumatic, face symmetric, no Cushingoid features. EYES:  Brown eyes.  Pupils equal round and reactive to light and accomodation.  No conjunctivitis or scleral icterus. ENT:  Oropharynx clear without lesion.  Tongue normal. Mucous membranes moist.  RESPIRATORY:  Clear to auscultation without rales, wheezes or rhonchi. CARDIOVASCULAR:  Regular rate and rhythm without murmur, rub or gallop. ABDOMEN:  Soft, non-tender, with active bowel sounds, and no hepatosplenomegaly.  No masses. SKIN:  No rashes, ulcers or lesions. EXTREMITIES: No edema, no skin discoloration or tenderness.  No palpable cords. LYMPH NODES: No palpable cervical, supraclavicular, axillary or inguinal adenopathy   NEUROLOGICAL: Unremarkable. PSYCH:  Appropriate.    No visits with results within 3 Day(s) from this visit.  Latest known visit with results is:  Appointment on 05/09/2017  Component Date Value Ref Range Status  . Total Protein 05/09/2017 7.7  6.5 - 8.1 g/dL Final  . Albumin 05/09/2017 4.4  3.5 - 5.0 g/dL Final  . AST 05/09/2017 20  15 - 41 U/L Final  . ALT 05/09/2017 18  14 - 54 U/L Final  . Alkaline Phosphatase 05/09/2017 69  38 - 126 U/L Final  . Total Bilirubin 05/09/2017 0.8  0.3 - 1.2 mg/dL Final  . Bilirubin, Direct 05/09/2017 0.1  0.1 - 0.5 mg/dL Final  . Indirect Bilirubin 05/09/2017 0.7  0.3 -  0.9 mg/dL Final   Performed at [facility], [address] [place], [place] [address]    Assessment:  Patricia Crane is a 47 y.o. female stage IIA left breast cancer s/p partial mastectomy and sentinel lymph node biopsy on 08/18/2012.  Pathology revealed a 2.5 cm grade III invasive ductal carcinoma of the breast.  Margins were uninvolved, but close (5 mm).  DCIS was present.  One sentinel lymph node was negative.  Tumor was ER + (60%), PR + (25%) and Her2/neu 2+ (negative by FISH).  Pathologic stage was T2N0M0.    Oncotype DX score was 24 which translated to a 16% risk of distant recurrence at 10 years with tamoxifen alone (confidence interval 12-19%).  She declined systemic chemotherapy.  BCI testing on 06/11/2017 revealed a 12.7% risk of late recurrence a( years 5-10) and a high likelihood of benefit.  She received radiation.  She was started on [facility] agonist) and Aromasin in 12/2012.  She had a reaction (inflammation at the injection site, swelling and fever). She was switched to Lupron (few joint aches and headache).  Last Lupron injection was in 08/2014.  She remains on Aromasin.   She has an abnormal right mammogram on 05/2013.  Biopsy was negative.  Breast MRI on 09/24/2014 revealed expected post-operative changes in the left breast and no evidence of malignancy.   Bilateral mammogram on 05/21/2017 revealed no evidence of malignancy.  She states that annual breast MRI was recommended per radiology.  CA 27.29 was 28.2 on 11/26/2013, 23.2 on 12/01/2014,  27.6 on 10/06/2015, 24.5 on 04/05/2016, 23.3 on 10/04/2016, and 21.6 on 04/23/2017.  She has been in menopause.  She has not had a menses for 2 years.  Estradiol was < 5 and FSH 8.2 on 01/12/2015.  Estradiol was 7.6 and FSH 7.6 on 10/06/2015.  Estradiol was 9.0 and FSH 23.9 on 04/05/2016.  Estradiol was 10.6 and FSH 26.2 on 08/16//2018.  Estradiol was < 5 and FSH 27.2 on 04/23/2017.  Bone density on 05/02/2015 revealed osteoporosis with a T-score of -2.8 in the AP spine.  Bone density on 05/21/2017 revealed osteoporosis with a T-score of -2.8 in AP spine L1-L4.  She is on Fosamax, calcium, and vitamin D.  Symptomatically, she is doing well.  Exam stable.  Liver function tests were normal on 05/09/2017.  Plan: 1.  Discuss interval mammogram- no malignancy. 2.  Discuss interval bone density study- osteoporosis. 3.  Discuss calcium and vitamin D.  Discuss consideration of Prolia.  Side effects reviewed.  Discuss need or dental clearance. 4.  Discuss BCI testing- high risk of recurrence. Discuss continuation of hormonal therapy years 5-10. 5.  Discuss cost of Aromasin.  Patient to try Femara.  Rx:  Femara 2.5 mg po q day (dis: #30; 5 refills). 6.  Schedule breast MRI on 11/20/2017. 7.  RTC as previously scheduled on 10/24/2017 for MD assessment and labs (CBC with diff, CMP, CA27.29).   Lequita Asal, MD   07/02/2017,3:35 PM

## 2017-07-02 NOTE — Progress Notes (Signed)
Patient here today for follow up. No concerns voiced.  °

## 2017-07-04 ENCOUNTER — Encounter: Payer: Self-pay | Admitting: Hematology and Oncology

## 2017-07-24 DIAGNOSIS — L300 Nummular dermatitis: Secondary | ICD-10-CM | POA: Diagnosis not present

## 2017-07-24 DIAGNOSIS — Z08 Encounter for follow-up examination after completed treatment for malignant neoplasm: Secondary | ICD-10-CM | POA: Diagnosis not present

## 2017-07-24 DIAGNOSIS — L814 Other melanin hyperpigmentation: Secondary | ICD-10-CM | POA: Diagnosis not present

## 2017-10-01 ENCOUNTER — Other Ambulatory Visit: Payer: Self-pay | Admitting: Hematology and Oncology

## 2017-10-01 DIAGNOSIS — C50919 Malignant neoplasm of unspecified site of unspecified female breast: Secondary | ICD-10-CM

## 2017-10-24 ENCOUNTER — Inpatient Hospital Stay: Payer: Commercial Managed Care - HMO | Attending: Hematology and Oncology

## 2017-10-24 ENCOUNTER — Encounter: Payer: Self-pay | Admitting: Hematology and Oncology

## 2017-10-24 ENCOUNTER — Other Ambulatory Visit: Payer: Self-pay | Admitting: Hematology and Oncology

## 2017-10-24 ENCOUNTER — Inpatient Hospital Stay: Payer: Commercial Managed Care - HMO | Admitting: Hematology and Oncology

## 2017-10-24 ENCOUNTER — Telehealth: Payer: Self-pay | Admitting: *Deleted

## 2017-10-24 ENCOUNTER — Other Ambulatory Visit: Payer: Self-pay

## 2017-10-24 VITALS — BP 111/74 | HR 62 | Temp 97.3°F | Resp 16 | Wt 127.0 lb

## 2017-10-24 DIAGNOSIS — Z87891 Personal history of nicotine dependence: Secondary | ICD-10-CM

## 2017-10-24 DIAGNOSIS — R5383 Other fatigue: Secondary | ICD-10-CM | POA: Insufficient documentation

## 2017-10-24 DIAGNOSIS — M810 Age-related osteoporosis without current pathological fracture: Secondary | ICD-10-CM | POA: Insufficient documentation

## 2017-10-24 DIAGNOSIS — C50912 Malignant neoplasm of unspecified site of left female breast: Secondary | ICD-10-CM | POA: Diagnosis not present

## 2017-10-24 DIAGNOSIS — M818 Other osteoporosis without current pathological fracture: Secondary | ICD-10-CM

## 2017-10-24 DIAGNOSIS — Z170 Estrogen receptor positive status [ER+]: Secondary | ICD-10-CM | POA: Insufficient documentation

## 2017-10-24 LAB — COMPREHENSIVE METABOLIC PANEL
ALT: 22 U/L (ref 0–44)
AST: 22 U/L (ref 15–41)
Albumin: 4.4 g/dL (ref 3.5–5.0)
Alkaline Phosphatase: 67 U/L (ref 38–126)
Anion gap: 7 (ref 5–15)
BUN: 14 mg/dL (ref 6–20)
CO2: 25 mmol/L (ref 22–32)
Calcium: 9.4 mg/dL (ref 8.9–10.3)
Chloride: 107 mmol/L (ref 98–111)
Creatinine, Ser: 0.6 mg/dL (ref 0.44–1.00)
GFR calc Af Amer: >60 mL/min (ref >60)
GFR calc non Af Amer: >60 mL/min (ref >60)
Glucose, Bld: 99 mg/dL (ref 70–99)
Potassium: 4 mmol/L (ref 3.5–5.1)
Sodium: 139 mmol/L (ref 135–145)
Total Bilirubin: 0.4 mg/dL (ref 0.3–1.2)
Total Protein: 7.6 g/dL (ref 6.5–8.1)

## 2017-10-24 LAB — CBC WITH DIFFERENTIAL/PLATELET
Basophils Absolute: 0 10*3/uL (ref 0–0.1)
Basophils Relative: 1 %
Eosinophils Absolute: 0.1 10*3/uL (ref 0–0.7)
Eosinophils Relative: 2 %
HCT: 39.9 % (ref 35.0–47.0)
Hemoglobin: 13.6 g/dL (ref 12.0–16.0)
Lymphocytes Relative: 39 %
Lymphs Abs: 2.1 10*3/uL (ref 1.0–3.6)
MCH: 31.5 pg (ref 26.0–34.0)
MCHC: 34.1 g/dL (ref 32.0–36.0)
MCV: 92.3 fL (ref 80.0–100.0)
Monocytes Absolute: 0.4 10*3/uL (ref 0.2–0.9)
Monocytes Relative: 7 %
Neutro Abs: 2.8 10*3/uL (ref 1.4–6.5)
Neutrophils Relative %: 51 %
Platelets: 292 10*3/uL (ref 150–440)
RBC: 4.33 MIL/uL (ref 3.80–5.20)
RDW: 13.2 % (ref 11.5–14.5)
WBC: 5.5 10*3/uL (ref 3.6–11.0)

## 2017-10-24 LAB — TSH: TSH: 1.039 u[IU]/mL (ref 0.350–4.500)

## 2017-10-24 NOTE — Telephone Encounter (Signed)
Called patient and LVM that her TSH is normal.

## 2017-10-24 NOTE — Progress Notes (Signed)
[facility] day:  10/24/2017    Chief Complaint: Patricia Crane is a 47 y.o. female with stage IIA left breast cancer who is seen for 5 month assessment.  HPI: The patient was last seen in the medical oncology clinic on 07/02/2017.  At that time, she was doing well.  Exam was stable.  Liver function tests were normal on 05/09/2017.  Mammogram was negative.  Bone density study revealed osteoporosis.  We discussed Prolia.  BCI testing revealed a high risk of late recurrence.  Secondary to the cost of Aromasin, she wished to try Femara.  She is tolerating Femara well.  She describes feeling a little bit more tired than usual.  She denies any breast concerns.  She is on calcium and vitamin D.  She wants to stay on Fosamax.   Past Medical History:  Diagnosis Date  . Breast cancer ([place]) 2014    breast invasive mammary carcinoma  . Chicken pox   . Heart murmur   . Personal history of radiation therapy 2014   left breast ca  . Shingles   . Skin cancer   . Thyroid disease     Past Surgical History:  Procedure Laterality Date  . BREAST BIOPSY Left 06/2012   invasive mammary  . BREAST BIOPSY Right 04/22/2013   neg- core  . BREAST LUMPECTOMY Left 08/18/2012   invasive mammary carcinoma. Margins close < 0.38m but negative, LN negative  . lasix Bilateral     Family History  Problem Relation Age of Onset  . Hyperlipidemia Mother   . Heart disease Mother   . Dementia Mother 711      alzheimers  . Cancer Father        tonsil ca  . Hyperlipidemia Sister   . Hyperlipidemia Brother   . Heart disease Maternal Aunt   . Hyperlipidemia Maternal Aunt   . Hypertension Maternal Aunt   . Heart disease Maternal Uncle   . Hyperlipidemia Maternal Uncle   . Stroke Maternal Uncle   . Hypertension Maternal Uncle   . Cancer Paternal Aunt 529      ovarian ca  . Drug abuse Paternal Aunt   . Cancer Paternal Uncle        lung CA ,  smoker  . Breast  cancer Neg Hx     Social History:  reports that she quit smoking about 14 years ago. Her smoking use included cigarettes. She smoked 2.00 packs per day. She has never used smokeless tobacco. She reports that she drinks alcohol. She reports that she does not use drugs.  She is from [place]  She has just returned from a trip to [place] She works in the [employer] The patient is alone today.  Allergies: No Known Allergies  Current Medications: Current Outpatient Medications  Medication Sig Dispense Refill  . alendronate (FOSAMAX) 70 MG tablet TAKE 1 TABLET BY MOUTH ONCE A WEEK ON AN EMPTY STOMACH WITH FULL GLASS OF WATER 12 tablet 9  . ALPRAZolam (XANAX) 0.25 MG tablet Take 1 tablet (0.25 mg total) by mouth 2 (two) times daily as needed for sleep or anxiety. 60 tablet 2  . calcium carbonate (OS-CAL) 600 MG TABS tablet Take 600 mg by mouth 2 (two) times daily with a meal.    . Cholecalciferol (D3 SUPER STRENGTH) 2000 UNITS CAPS Take by mouth daily.    .Marland Kitchenletrozole (FEMARA) 2.5 MG tablet Take 1 tablet (  2.5 mg total) by mouth daily. 30 tablet 5  . Melatonin-Pyridoxine (MELATIN) 3-1 MG TABS Take 3 mg by mouth at bedtime.    . methocarbamol (ROBAXIN) 500 MG tablet     . Misc Natural Products (OSTEO BI-FLEX ADV DOUBLE ST PO) Take by mouth.    . Multiple Vitamins-Minerals (MULTIVITAMIN WITH MINERALS) tablet Take 1 tablet by mouth daily.    . Probiotic Product (ALIGN) 4 MG CAPS Take 4 mg by mouth daily.    Marland Kitchen triamcinolone cream (KENALOG) 0.1 % Apply 1 application topically 2 (two) times daily. 30 g 2   No current facility-administered medications for this visit.     Review of Systems:  GENERAL:  Feels "more tired than usual".  No fevers, sweats.  Weight loss of 2 pounds. PERFORMANCE STATUS (ECOG): 0 HEENT:  No visual changes, runny nose, sore throat, mouth sores or tenderness. Lungs: No shortness of breath or cough.  No hemoptysis. Cardiac:  No chest pain,  palpitations, orthopnea, or PND. GI:  No nausea, vomiting, diarrhea, constipation, melena or hematochezia. GU:  No urgency, frequency, dysuria, or hematuria. Musculoskeletal:  No back pain.  No joint pain.  No muscle tenderness. Extremities:  No pain or swelling. Skin:  No rashes or skin changes. Neuro:  No headache, numbness or weakness, balance or coordination issues. Endocrine:  No diabetes, thyroid issues, hot flashes or night sweats. Psych:  No mood changes, depression or anxiety. Pain:  No focal pain. Review of systems:  All other systems reviewed and found to be negative.   Physical Exam: Blood pressure 111/74, pulse 62, temperature (!) 97.3 F (36.3 C), temperature source Tympanic, resp. rate 16, weight 127 lb (57.6 kg), last menstrual period 12/17/2012. GENERAL:  Well developed, well nourished, woman sitting comfortably in the exam room in no acute distress. MENTAL STATUS:  Alert and oriented to person, place and time. HEAD:  Long dark hair.  Normocephalic, atraumatic, face symmetric, no Cushingoid features. EYES:  Brown eyes.  Pupils equal round and reactive to light and accomodation.  No conjunctivitis or scleral icterus. ENT:  Oropharynx clear without lesion.  Tongue normal. Mucous membranes moist.  RESPIRATORY:  Clear to auscultation without rales, wheezes or rhonchi. CARDIOVASCULAR:  Regular rate and rhythm without murmur, rub or gallop. ABDOMEN:  Soft, non-tender, with active bowel sounds, and no hepatosplenomegaly.  No masses. SKIN:  No rashes, ulcers or lesions. EXTREMITIES: No edema, no skin discoloration or tenderness.  No palpable cords. LYMPH NODES: No palpable cervical, supraclavicular, axillary or inguinal adenopathy  NEUROLOGICAL: Unremarkable. PSYCH:  Appropriate.    Orders Only on 10/24/2017  Component Date Value Ref Range Status  . Sodium 10/24/2017 139  135 - 145 mmol/L Final  . Potassium 10/24/2017 4.0  3.5 - 5.1 mmol/L Final  . Chloride 10/24/2017 107   98 - 111 mmol/L Final  . CO2 10/24/2017 25  22 - 32 mmol/L Final  . Glucose, Bld 10/24/2017 99  70 - 99 mg/dL Final  . BUN 10/24/2017 14  6 - 20 mg/dL Final  . Creatinine, Ser 10/24/2017 0.60  0.44 - 1.00 mg/dL Final  . Calcium 10/24/2017 9.4  8.9 - 10.3 mg/dL Final  . Total Protein 10/24/2017 7.6  6.5 - 8.1 g/dL Final  . Albumin 10/24/2017 4.4  3.5 - 5.0 g/dL Final  . AST 10/24/2017 22  15 - 41 U/L Final  . ALT 10/24/2017 22  0 - 44 U/L Final  . Alkaline Phosphatase 10/24/2017 67  38 - 126 U/L Final  .  Total Bilirubin 10/24/2017 0.4  0.3 - 1.2 mg/dL Final  . GFR calc non Af Amer 10/24/2017 >60  >60 mL/min Final  . GFR calc Af Amer 10/24/2017 >60  >60 mL/min Final   Comment: (NOTE) The eGFR has been calculated using the CKD EPI equation. This calculation has not been validated in all clinical situations. eGFR's persistently <60 mL/min signify possible Chronic Kidney Disease.   Georgiann Hahn gap 10/24/2017 7  5 - 15 Final   Performed at [facility], [address] [place], [place] [address]  . WBC 10/24/2017 5.5  3.6 - 11.0 K/uL Final  . RBC 10/24/2017 4.33  3.80 - 5.20 MIL/uL Final  . Hemoglobin 10/24/2017 13.6  12.0 - 16.0 g/dL Final  . HCT 10/24/2017 39.9  35.0 - 47.0 % Final  . MCV 10/24/2017 92.3  80.0 - 100.0 fL Final  . MCH 10/24/2017 31.5  26.0 - 34.0 pg Final  . MCHC 10/24/2017 34.1  32.0 - 36.0 g/dL Final  . RDW 10/24/2017 13.2  11.5 - 14.5 % Final  . Platelets 10/24/2017 292  150 - 440 K/uL Final  . Neutrophils Relative % 10/24/2017 51  % Final  . Neutro Abs 10/24/2017 2.8  1.4 - 6.5 K/uL Final  . Lymphocytes Relative 10/24/2017 39  % Final  . Lymphs Abs 10/24/2017 2.1  1.0 - 3.6 K/uL Final  . Monocytes Relative 10/24/2017 7  % Final  . Monocytes Absolute 10/24/2017 0.4  0.2 - 0.9 K/uL Final  . Eosinophils Relative 10/24/2017 2  % Final  . Eosinophils Absolute 10/24/2017 0.1  0 - 0.7 K/uL Final  . Basophils Relative 10/24/2017 1  % Final  . Basophils Absolute  10/24/2017 0.0  0 - 0.1 K/uL Final   Performed at [facility], [address] [place], [place] [address]    Assessment:  Patricia Crane is a 47 y.o. female stage IIA left breast cancer s/p partial mastectomy and sentinel lymph node biopsy on 08/18/2012.  Pathology revealed a 2.5 cm grade III invasive ductal carcinoma of the breast.  Margins were uninvolved, but close (5 mm).  DCIS was present.  One sentinel lymph node was negative.  Tumor was ER + (60%), PR + (25%) and Her2/neu 2+ (negative by FISH).  Pathologic stage was T2N0M0.    Oncotype DX score was 24 which translated to a 16% risk of distant recurrence at 10 years with tamoxifen alone (confidence interval 12-19%).  She declined systemic chemotherapy.  BCI testing on 06/11/2017 revealed a 12.7% risk of late recurrence (years 5-10) and a high likelihood of benefit.  She received radiation.  She was started on Trelstar [facility] agonist) and Aromasin in 12/2012.  She had a reaction (inflammation at the injection site, swelling and fever). She was switched to Lupron (few joint aches and headache).  Last Lupron injection was in 08/2014.  She remains on Aromasin.   She has an abnormal right mammogram on 05/2013.  Biopsy was negative.  Breast MRI on 09/24/2014 revealed expected post-operative changes in the left breast and no evidence of malignancy.  Bilateral mammogram on 05/21/2017 revealed no evidence of malignancy.  She states that annual breast MRI was recommended per radiology.  CA 27.29 was 28.2 on 11/26/2013, 23.2 on 12/01/2014,  27.6 on 10/06/2015, 24.5 on 04/05/2016, 23.3 on 10/04/2016, 21.6 on 04/23/2017, and 24 on 10/24/2017.  She has been in menopause.  She has not had a menses for 2 years.  Estradiol was < 5 and FSH  8.2 on 01/12/2015.  Estradiol was 7.6 and FSH 7.6 on 10/06/2015.  Estradiol was 9.0 and FSH 23.9 on 04/05/2016.  Estradiol was 10.6 and FSH 26.2 on 08/16//2018.  Estradiol was < 5 and FSH 27.2 on 04/23/2017.  Bone  density on 05/02/2015 revealed osteoporosis with a T-score of -2.8 in the AP spine.  Bone density on 05/21/2017 revealed osteoporosis with a T-score of -2.8 in AP spine L1-L4.  She is on Fosamax, calcium, and vitamin D.  Symptomatically, she feels a little tired.  Exam is unremarkable.  Labs are normal.  Plan: 1.  Labs today:  CBC with diff, CMP, CA27.29. 2.  Stage II left breast cancer:  Clinically doing well.  BCI testing reveals a high risk of recurrence.  Patient wishes to continue Femara (Aromasin is costly).  Bilateral breast MRI on 11/20/2017. 3.  Osteoporosis:  Review bone density 2017 and 2019 - stable.  Discuss consideration of switch from Fosamax to Prolia.  Patient wishes to continue Fosamax.  Continue calcium and vitamin D. 4.  Fatigue:  Etiology unclear.  Check TSH. 5.  RTC in 6 months for MD assessment including breast exam, labs (CBC with diff, CMP, CA27.29), and review of breast MRI.   Lequita Asal, MD   10/24/2017, 11:07 AM

## 2017-10-24 NOTE — Telephone Encounter (Signed)
-----   Message from Lequita Asal, MD sent at 10/24/2017  1:52 PM EDT ----- Regarding: Please call patient  TSH is normal  M   ----- Message ----- From: Buel Ream, Lab In [place] Sent: 10/24/2017  12:35 PM EDT To: Lequita Asal, MD

## 2017-10-25 LAB — CA 27.29 (SERIAL MONITOR): CA 27.29: 24 U/mL (ref 0.0–38.6)

## 2017-10-29 ENCOUNTER — Telehealth: Payer: Self-pay | Admitting: Gastroenterology

## 2017-10-29 NOTE — Telephone Encounter (Signed)
Pt is calling  to schedule colonoscopy please call pt

## 2017-11-02 ENCOUNTER — Other Ambulatory Visit: Payer: Self-pay | Admitting: Hematology and Oncology

## 2017-11-04 ENCOUNTER — Other Ambulatory Visit: Payer: Self-pay | Admitting: Urgent Care

## 2017-11-04 ENCOUNTER — Encounter: Payer: Self-pay | Admitting: Hematology and Oncology

## 2017-11-04 ENCOUNTER — Telehealth: Payer: Self-pay | Admitting: Gastroenterology

## 2017-11-04 MED ORDER — LETROZOLE 2.5 MG PO TABS: 2.5000 mg | ORAL_TABLET | Freq: Every day | ORAL | 3 refills | Status: DC

## 2017-11-04 NOTE — Telephone Encounter (Signed)
Pt is calling to schedule colonoscopy  °

## 2017-11-07 ENCOUNTER — Other Ambulatory Visit: Payer: Self-pay

## 2017-11-07 DIAGNOSIS — K625 Hemorrhage of anus and rectum: Secondary | ICD-10-CM

## 2017-11-07 MED ORDER — NA SULFATE-K SULFATE-MG SULF 17.5-3.13-1.6 GM/177ML PO SOLN: 1.0000 | Freq: Once | ORAL | 0 refills | Status: AC

## 2017-11-08 NOTE — Telephone Encounter (Signed)
Pt scheduled for a colonoscopy on 12/11/17.

## 2017-11-16 ENCOUNTER — Encounter: Payer: Self-pay | Admitting: Hematology and Oncology

## 2017-11-19 ENCOUNTER — Telehealth: Payer: Self-pay | Admitting: *Deleted

## 2017-11-19 NOTE — Telephone Encounter (Signed)
Please cancel CT for tomorrow. Per Velna Hatchet 11/19/17 scheduling message. It will R/S once we get the approval.  I called patient to make her aware that the MRI scheduled for 11/20/17 has been  Cancelled.

## 2017-11-20 ENCOUNTER — Ambulatory Visit (HOSPITAL_COMMUNITY): Admission: RE | Admit: 2017-11-20 | Payer: 59 | Source: Ambulatory Visit

## 2017-11-20 ENCOUNTER — Encounter (HOSPITAL_COMMUNITY): Payer: Self-pay

## 2017-11-29 ENCOUNTER — Telehealth: Payer: Self-pay | Admitting: Gastroenterology

## 2017-11-29 NOTE — Telephone Encounter (Signed)
Need a referral on her.

## 2017-11-29 NOTE — Telephone Encounter (Signed)
I have not seen a referral for this patient.

## 2017-11-29 NOTE — Telephone Encounter (Signed)
Pt left vm to check on pre authorization status of her colonoscopy 10/23 please call her so she can make arrangments for her procedure

## 2017-12-02 ENCOUNTER — Ambulatory Visit (HOSPITAL_COMMUNITY)
Admission: RE | Admit: 2017-12-02 | Discharge: 2017-12-02 | Disposition: A | Discharge status: Home / Self Care | Payer: Commercial Managed Care - HMO | Source: Ambulatory Visit | Attending: Hematology and Oncology | Admitting: Hematology and Oncology

## 2017-12-02 DIAGNOSIS — C50912 Malignant neoplasm of unspecified site of left female breast: Secondary | ICD-10-CM | POA: Diagnosis present

## 2017-12-02 DIAGNOSIS — Z853 Personal history of malignant neoplasm of breast: Secondary | ICD-10-CM | POA: Diagnosis not present

## 2017-12-02 MED ORDER — GADOBUTROL 1 MMOL/ML IV SOLN
7.5000 mL | Freq: Once | INTRAVENOUS | Status: AC | PRN
  Administered 2017-12-02: 6 mL via INTRAVENOUS

## 2017-12-03 ENCOUNTER — Other Ambulatory Visit: Payer: Self-pay

## 2017-12-03 DIAGNOSIS — K625 Hemorrhage of anus and rectum: Secondary | ICD-10-CM

## 2017-12-03 DIAGNOSIS — Z23 Encounter for immunization: Secondary | ICD-10-CM | POA: Diagnosis not present

## 2017-12-03 NOTE — Telephone Encounter (Signed)
Good morning Patricia Crane.  You've already completed the pre-auth.  It was done in September.

## 2017-12-11 ENCOUNTER — Ambulatory Visit: Payer: Commercial Managed Care - HMO | Admitting: Anesthesiology

## 2017-12-11 ENCOUNTER — Telehealth: Payer: Self-pay | Admitting: Gastroenterology

## 2017-12-11 ENCOUNTER — Ambulatory Visit
Admission: RE | Admit: 2017-12-11 | Discharge: 2017-12-11 | Disposition: A | Discharge status: Home / Self Care | Payer: Commercial Managed Care - HMO | Source: Ambulatory Visit | Attending: Gastroenterology | Admitting: Gastroenterology

## 2017-12-11 ENCOUNTER — Encounter
Admission: RE | Disposition: A | Discharge status: Home / Self Care | Payer: Self-pay | Source: Ambulatory Visit | Attending: Gastroenterology

## 2017-12-11 DIAGNOSIS — R011 Cardiac murmur, unspecified: Secondary | ICD-10-CM | POA: Diagnosis not present

## 2017-12-11 DIAGNOSIS — Z8049 Family history of malignant neoplasm of other genital organs: Secondary | ICD-10-CM | POA: Insufficient documentation

## 2017-12-11 DIAGNOSIS — Z820 Family history of epilepsy and other diseases of the nervous system: Secondary | ICD-10-CM | POA: Insufficient documentation

## 2017-12-11 DIAGNOSIS — Z801 Family history of malignant neoplasm of trachea, bronchus and lung: Secondary | ICD-10-CM | POA: Insufficient documentation

## 2017-12-11 DIAGNOSIS — D649 Anemia, unspecified: Secondary | ICD-10-CM | POA: Diagnosis not present

## 2017-12-11 DIAGNOSIS — M1990 Unspecified osteoarthritis, unspecified site: Secondary | ICD-10-CM | POA: Diagnosis not present

## 2017-12-11 DIAGNOSIS — Z8249 Family history of ischemic heart disease and other diseases of the circulatory system: Secondary | ICD-10-CM | POA: Diagnosis not present

## 2017-12-11 DIAGNOSIS — Z79899 Other long term (current) drug therapy: Secondary | ICD-10-CM | POA: Diagnosis not present

## 2017-12-11 DIAGNOSIS — Z823 Family history of stroke: Secondary | ICD-10-CM | POA: Insufficient documentation

## 2017-12-11 DIAGNOSIS — Z85828 Personal history of other malignant neoplasm of skin: Secondary | ICD-10-CM | POA: Diagnosis not present

## 2017-12-11 DIAGNOSIS — K640 First degree hemorrhoids: Secondary | ICD-10-CM | POA: Diagnosis not present

## 2017-12-11 DIAGNOSIS — Z853 Personal history of malignant neoplasm of breast: Secondary | ICD-10-CM | POA: Insufficient documentation

## 2017-12-11 DIAGNOSIS — Z923 Personal history of irradiation: Secondary | ICD-10-CM | POA: Insufficient documentation

## 2017-12-11 DIAGNOSIS — K649 Unspecified hemorrhoids: Secondary | ICD-10-CM | POA: Diagnosis not present

## 2017-12-11 DIAGNOSIS — K625 Hemorrhage of anus and rectum: Secondary | ICD-10-CM

## 2017-12-11 DIAGNOSIS — Z800 Family history of malignant neoplasm of digestive organs: Secondary | ICD-10-CM | POA: Insufficient documentation

## 2017-12-11 DIAGNOSIS — Z8669 Personal history of other diseases of the nervous system and sense organs: Secondary | ICD-10-CM | POA: Insufficient documentation

## 2017-12-11 DIAGNOSIS — E079 Disorder of thyroid, unspecified: Secondary | ICD-10-CM | POA: Diagnosis not present

## 2017-12-11 HISTORY — PX: COLONOSCOPY WITH PROPOFOL: SHX5780

## 2017-12-11 SURGERY — COLONOSCOPY WITH PROPOFOL
Anesthesia: General

## 2017-12-11 MED ORDER — FENTANYL CITRATE (PF) 100 MCG/2ML IJ SOLN
INTRAMUSCULAR | Status: DC | PRN
  Administered 2017-12-11: 50 ug via INTRAVENOUS

## 2017-12-11 MED ORDER — PHENYLEPHRINE HCL 10 MG/ML IJ SOLN
INTRAMUSCULAR | Status: DC | PRN
  Administered 2017-12-11: 100 ug via INTRAVENOUS

## 2017-12-11 MED ORDER — SODIUM CHLORIDE 0.9 % IV SOLN
INTRAVENOUS | Status: DC
  Administered 2017-12-11: 1000 mL via INTRAVENOUS

## 2017-12-11 MED ORDER — PROPOFOL 10 MG/ML IV BOLUS
INTRAVENOUS | Status: DC | PRN
  Administered 2017-12-11: 100 mg via INTRAVENOUS

## 2017-12-11 MED ORDER — FENTANYL CITRATE (PF) 100 MCG/2ML IJ SOLN
INTRAMUSCULAR | Status: AC
  Filled 2017-12-11: qty 2

## 2017-12-11 MED ORDER — LIDOCAINE 2% (20 MG/ML) 5 ML SYRINGE
INTRAMUSCULAR | Status: DC | PRN
  Administered 2017-12-11: 30 mg via INTRAVENOUS

## 2017-12-11 MED ORDER — PROPOFOL 500 MG/50ML IV EMUL
INTRAVENOUS | Status: DC | PRN
  Administered 2017-12-11: 170 ug/kg/min via INTRAVENOUS

## 2017-12-11 NOTE — Telephone Encounter (Signed)
Left vm for pt to call office and schedule hemrroid banding with Dr. Marius Ditch

## 2017-12-11 NOTE — Telephone Encounter (Signed)
-----   Message from Rushie Chestnut, [place] sent at 12/11/2017  1:31 PM EDT ----- Sherald Hess  Please arrange office visit with Dr Marius Ditch for banding of hemorroids    Regards    Dr Jonathon Bellows  Gastroenterology/Hepatology Pager: [phone]

## 2017-12-11 NOTE — Anesthesia Preprocedure Evaluation (Signed)
Anesthesia Evaluation  Patient identified by MRN, date of birth, ID band Patient awake    Reviewed: Allergy & Precautions, NPO status , Patient's Chart, lab work & pertinent test results  History of Anesthesia Complications Negative for: history of anesthetic complications  Airway Mallampati: II  TM Distance: >3 FB Neck ROM: Full    Dental no notable dental hx.    Pulmonary neg sleep apnea, neg COPD, former smoker,    breath sounds clear to auscultation- rhonchi (-) wheezing      Cardiovascular Exercise Tolerance: Good (-) hypertension(-) CAD, (-) Past MI, (-) Cardiac Stents and (-) CABG  Rhythm:Regular Rate:Normal - Systolic murmurs and - Diastolic murmurs    Neuro/Psych negative neurological ROS  negative psych ROS   GI/Hepatic negative GI ROS, Neg liver ROS,   Endo/Other  negative endocrine ROSneg diabetes  Renal/GU negative Renal ROS     Musculoskeletal  (+) Arthritis ,   Abdominal (+) - obese,   Peds  Hematology  (+) anemia ,   Anesthesia Other Findings Past Medical History: 2014: Breast cancer ([place])     Comment:   breast invasive mammary carcinoma No date: Chicken pox No date: Heart murmur 2014: Personal history of radiation therapy     Comment:  left breast ca No date: Shingles No date: Skin cancer No date: Thyroid disease   Reproductive/Obstetrics                             Anesthesia Physical Anesthesia Plan  ASA: II  Anesthesia Plan: General   Post-op Pain Management:    Induction: Intravenous  PONV Risk Score and Plan: 2 and Propofol infusion  Airway Management Planned: Natural Airway  Additional Equipment:   Intra-op Plan:   Post-operative Plan:   Informed Consent: I have reviewed the patients History and Physical, chart, labs and discussed the procedure including the risks, benefits and alternatives for the proposed anesthesia with the patient or  authorized representative who has indicated his/her understanding and acceptance.   Dental advisory given  Plan Discussed with: CRNA and Anesthesiologist  Anesthesia Plan Comments:         Anesthesia Quick Evaluation

## 2017-12-11 NOTE — Anesthesia Postprocedure Evaluation (Signed)
Anesthesia Post Note  Patient: Patricia Crane  Procedure(s) Performed: COLONOSCOPY WITH PROPOFOL (N/A )  Patient location during evaluation: Endoscopy Anesthesia Type: General Level of consciousness: awake and alert Pain management: pain level controlled Vital Signs Assessment: post-procedure vital signs reviewed and stable Respiratory status: spontaneous breathing, nonlabored ventilation, respiratory function stable and patient connected to nasal cannula oxygen Cardiovascular status: blood pressure returned to baseline and stable Postop Assessment: no apparent nausea or vomiting Anesthetic complications: no     Last Vitals:  Vitals:   12/11/17 1303 12/11/17 1313  BP: 114/67 113/62  Pulse: 79 75  Resp: 17 15  Temp:    SpO2: 100% 99%    Last Pain:  Vitals:   12/11/17 1313  TempSrc:   PainSc: 0-No pain                 Murl Golladay S

## 2017-12-11 NOTE — Op Note (Signed)
[facility] Gastroenterology Patient Name: Patricia Crane Procedure Date: 12/11/2017 12:18 PM MRN: [id number] Account #: [id number] Date of Birth: Oct 31, 1970 Admit Type: Outpatient Age: 47 Room: [facility] ENDO ROOM 1 Gender: Female Note Status: Finalized Procedure:            Colonoscopy Indications:          Rectal bleeding Providers:            Jonathon Bellows MD, MD Referring MD:         Deborra Medina, MD (Referring MD) Medicines:            Monitored Anesthesia Care Complications:        No immediate complications. Procedure:            Pre-Anesthesia Assessment:                       - Prior to the procedure, a History and Physical was                        performed, and patient medications, allergies and                        sensitivities were reviewed. The patient's tolerance of                        previous anesthesia was reviewed.                       - The risks and benefits of the procedure and the                        sedation options and risks were discussed with the                        patient. All questions were answered and informed                        consent was obtained.                       - ASA Grade Assessment: II - A patient with mild                        systemic disease.                       After obtaining informed consent, the colonoscope was                        passed under direct vision. Throughout the procedure,                        the patient's blood pressure, pulse, and oxygen                        saturations were monitored continuously. The                        Colonoscope was introduced through the anus and                        advanced to  the the cecum, identified by the                        appendiceal orifice, IC valve and transillumination.                        The colonoscopy was performed with ease. The patient                        tolerated the procedure well. The quality of the bowel           preparation was excellent. Findings:      The perianal and digital rectal examinations were normal.      Non-bleeding internal hemorrhoids were found during retroflexion. The       hemorrhoids were medium-sized and Grade I (internal hemorrhoids that do       not prolapse).      The exam was otherwise without abnormality on direct and retroflexion       views. Impression:           - Non-bleeding internal hemorrhoids.                       - The examination was otherwise normal on direct and                        retroflexion views.                       - No specimens collected. Recommendation:       - Discharge patient to home (with escort).                       - Resume previous diet.                       - Continue present medications.                       - I will to Dr Marius Ditch for banding of hemmoriods Procedure Code(s):    --- Professional ---                       [phone], Colonoscopy, flexible; diagnostic, including                        collection of specimen(s) by brushing or washing, when                        performed (separate procedure) Diagnosis Code(s):    --- Professional ---                       K64.0, First degree hemorrhoids                       K62.5, Hemorrhage of anus and rectum CPT copyright 2018 American Medical Association. All rights reserved. The codes documented in this report are preliminary and upon coder review may  be revised to meet current compliance requirements. Jonathon Bellows, MD Jonathon Bellows MD, MD 12/11/2017 12:51:20 PM This report has been signed electronically. Number of Addenda: 0 Note Initiated On: 12/11/2017 12:18 PM Scope Withdrawal Time: 0 hours 13 minutes 25 seconds  Total  Procedure Duration: 0 hours 16 minutes 56 seconds       [facility]

## 2017-12-11 NOTE — Transfer of Care (Signed)
Immediate Anesthesia Transfer of Care Note  Patient: Patricia Crane  Procedure(s) Performed: COLONOSCOPY WITH PROPOFOL (N/A )  Patient Location: PACU and Endoscopy Unit  Anesthesia Type:General  Level of Consciousness: awake and patient cooperative  Airway & Oxygen Therapy: Patient Spontanous Breathing and Patient connected to nasal cannula oxygen  Post-op Assessment: Report given to RN and Post -op Vital signs reviewed and stable  Post vital signs: Reviewed and stable  Last Vitals:  Vitals Value Taken Time  BP    Temp    Pulse 77 12/11/2017 12:52 PM  Resp 19 12/11/2017 12:52 PM  SpO2 98 % 12/11/2017 12:52 PM  Vitals shown include unvalidated device data.  Last Pain:  Vitals:   12/11/17 1253  TempSrc: (P) Tympanic  PainSc:       Patients Stated Pain Goal: 0 (49/96/92 4932)  Complications: No apparent anesthesia complications

## 2017-12-11 NOTE — Anesthesia Post-op Follow-up Note (Signed)
Anesthesia QCDR form completed.        

## 2017-12-11 NOTE — H&P (Signed)
Jonathon Bellows, MD [address] [place], [place], [place], [address] [address], [place], [place], [place], [address] Phone: [phone]  Fax: [phone]  Primary Care Physician:  [name], MD   Pre-Procedure History & Physical: HPI:  Patricia Crane is a 47 y.o. female is here for an colonoscopy.   Past Medical History:  Diagnosis Date  . Breast cancer ([place]) 2014    breast invasive mammary carcinoma  . Chicken pox   . Heart murmur   . Personal history of radiation therapy 2014   left breast ca  . Shingles   . Skin cancer   . Thyroid disease     Past Surgical History:  Procedure Laterality Date  . BREAST BIOPSY Left 06/2012   invasive mammary  . BREAST BIOPSY Right 04/22/2013   neg- core  . BREAST LUMPECTOMY Left 08/18/2012   invasive mammary carcinoma. Margins close < 0.81mm but negative, LN negative  . lasix Bilateral     Prior to Admission medications   Medication Sig Start Date End Date Taking? Authorizing Provider  alendronate (FOSAMAX) 70 MG tablet TAKE 1 TABLET BY MOUTH ONCE A WEEK ON AN EMPTY STOMACH WITH FULL GLASS OF WATER 10/01/17  Yes Corcoran, Drue Second, MD  ALPRAZolam (XANAX) 0.25 MG tablet Take 1 tablet (0.25 mg total) by mouth 2 (two) times daily as needed for sleep or anxiety. 02/18/13  Yes [name], MD  calcium carbonate (OS-CAL) 600 MG TABS tablet Take 600 mg by mouth 2 (two) times daily with a meal.   Yes [provider]  letrozole (FEMARA) 2.5 MG tablet Take 1 tablet (2.5 mg total) by mouth daily. 11/04/17  Yes Karen Kitchens, NP  Cholecalciferol (D3 SUPER STRENGTH) 2000 UNITS CAPS Take by mouth daily.    [provider]  Melatonin-Pyridoxine (MELATIN) 3-1 MG TABS Take 3 mg by mouth at bedtime.    [provider]  methocarbamol (ROBAXIN) 500 MG tablet  02/21/14   [provider]  Misc Natural Products (OSTEO BI-FLEX ADV DOUBLE ST PO) Take by mouth.    [provider]  Multiple  Vitamins-Minerals (MULTIVITAMIN WITH MINERALS) tablet Take 1 tablet by mouth daily.    [provider]  Probiotic Product (ALIGN) 4 MG CAPS Take 4 mg by mouth daily.    [provider]  triamcinolone cream (KENALOG) 0.1 % Apply 1 application topically 2 (two) times daily. 10/31/16   [name], MD    Allergies as of 11/08/2017  . (No Known Allergies)    Family History  Problem Relation Age of Onset  . Hyperlipidemia Mother   . Heart disease Mother   . Dementia Mother 72       alzheimers  . Cancer Father        tonsil ca  . Hyperlipidemia Sister   . Hyperlipidemia Brother   . Heart disease Maternal Aunt   . Hyperlipidemia Maternal Aunt   . Hypertension Maternal Aunt   . Heart disease Maternal Uncle   . Hyperlipidemia Maternal Uncle   . Stroke Maternal Uncle   . Hypertension Maternal Uncle   . Cancer Paternal Aunt 19       ovarian ca  . Drug abuse Paternal Aunt   . Cancer Paternal Uncle        lung CA ,  smoker  . Breast cancer Neg Hx     Social History   Socioeconomic History  . Marital status: Married  Spouse name: Not on file  . Number of children: 0  . Years of education: Not on file  . Highest education level: Not on file  Occupational History  . Not on file  Social Needs  . Financial resource strain: Not hard at all  . Food insecurity:    Worry: Never true    Inability: Not on file  . Transportation needs:    Medical: No    Non-medical: No  Tobacco Use  . Smoking status: Former Smoker    Packs/day: 2.00    Types: Cigarettes    Last attempt to quit: 06/10/2003    Years since quitting: 14.5  . Smokeless tobacco: Never Used  . Tobacco comment: social smoker  Substance and Sexual Activity  . Alcohol use: Yes    Comment: occasionally  . Drug use: No  . Sexual activity: Not on file  Lifestyle  . Physical activity:    Days per week: Not on file    Minutes per session: Not on file  . Stress: Not on file  Relationships  . Social  connections:    Talks on phone: Not on file    Gets together: Not on file    Attends religious service: Not on file    Active member of club or organization: Not on file    Attends meetings of clubs or organizations: Not on file    Relationship status: Not on file  . Intimate partner violence:    Fear of current or ex partner: Not on file    Emotionally abused: Not on file    Physically abused: Not on file    Forced sexual activity: Not on file  Other Topics Concern  . Not on file  Social History Narrative  . Not on file    Review of Systems: See HPI, otherwise negative ROS  Physical Exam: BP 111/71   Pulse 67   Temp 98.1 F (36.7 C) (Tympanic)   Resp 18   Ht 5' (1.524 m)   Wt 56.7 kg   LMP 12/17/2012   BMI 24.41 kg/m  General:   Alert,  pleasant and cooperative in NAD Head:  Normocephalic and atraumatic. Neck:  Supple; no masses or thyromegaly. Lungs:  Clear throughout to auscultation, normal respiratory effort.    Heart:  +S1, +S2, Regular rate and rhythm, No edema. Abdomen:  Soft, nontender and nondistended. Normal bowel sounds, without guarding, and without rebound.   Neurologic:  Alert and  oriented x4;  grossly normal neurologically.  Impression/Plan: Patricia Crane is here for an colonoscopy to be performed for rectal bleeding.   Risks, benefits, limitations, and alternatives regarding  colonoscopy have been reviewed with the patient.  Questions have been answered.  All parties agreeable.   Jonathon Bellows, MD  12/11/2017, 12:16 PM

## 2018-01-08 ENCOUNTER — Encounter: Payer: Self-pay | Admitting: Hematology and Oncology

## 2018-01-10 ENCOUNTER — Encounter: Payer: Self-pay | Admitting: Gastroenterology

## 2018-01-10 ENCOUNTER — Ambulatory Visit: Payer: 59 | Admitting: Gastroenterology

## 2018-01-10 VITALS — BP 116/72 | HR 74 | Ht 60.0 in | Wt 127.8 lb

## 2018-01-10 DIAGNOSIS — K640 First degree hemorrhoids: Secondary | ICD-10-CM

## 2018-01-10 NOTE — Progress Notes (Signed)
Cephas Darby, MD [address]  [place]  [place], [place] [address]  Main: [phone]  Fax: [phone]    Gastroenterology Consultation  Referring Provider:     [name], MD Primary Care Physician:  [name], MD Primary Gastroenterologist:  Dr. Cephas Darby Reason for Consultation:     Symptomatic hemorrhoids        HPI:   Patricia Crane is a 47 y.o. female referred by Dr. [name], MD  for consultation & management of symptomatic hemorrhoids.  Patient has been experiencing more than 5 years history of symptoms related to hemorrhoids including severe rectal pressure, discomfort associated with blood on wiping, perianal itching and irritation which has gotten worse since she has been on breast cancer treatment.  She has been suffering from severe constipation associated with significant straining and she is worried that she may have rectal and vaginal prolapse and affecting her urination.  She has been adding more fiber in her diet including Benefiber as well as taking MiraLAX which has significantly improved her symptoms.  She underwent colonoscopy by Dr. Vicente Males about a month ago due to rectal bleeding, exam was unremarkable except for internal hemorrhoids that were large.  Therefore, she is referred here today for hemorrhoid ligation  NSAIDs: None  Antiplts/Anticoagulants/Anti thrombotics: None  GI Procedures:  Colonoscopy 12/11/2017 - Non-bleeding internal hemorrhoids. - The examination was otherwise normal on direct and retroflexion views. - No specimens collected.  Past Medical History:  Diagnosis Date  . Breast cancer ([place]) 2014    breast invasive mammary carcinoma  . Chicken pox   . Heart murmur   . Personal history of radiation therapy 2014   left breast ca  . Shingles   . Skin cancer   . Thyroid disease     Past Surgical History:  Procedure Laterality Date  . BREAST BIOPSY Left 06/2012   invasive mammary  . BREAST BIOPSY Right  04/22/2013   neg- core  . BREAST LUMPECTOMY Left 08/18/2012   invasive mammary carcinoma. Margins close < 0.57mm but negative, LN negative  . COLONOSCOPY WITH PROPOFOL N/A 12/11/2017   Procedure: COLONOSCOPY WITH PROPOFOL;  Surgeon: Jonathon Bellows, MD;  Location[facility] ENDOSCOPY;  Service: Gastroenterology;  Laterality: N/A;  . lasix Bilateral     Current Outpatient Medications:  .  alendronate (FOSAMAX) 70 MG tablet, TAKE 1 TABLET BY MOUTH ONCE A WEEK ON AN EMPTY STOMACH WITH FULL GLASS OF WATER, Disp: 12 tablet, Rfl: 9 .  ALPRAZolam (XANAX) 0.25 MG tablet, Take 1 tablet (0.25 mg total) by mouth 2 (two) times daily as needed for sleep or anxiety., Disp: 60 tablet, Rfl: 2 .  calcium carbonate (OS-CAL) 600 MG TABS tablet, Take 600 mg by mouth 2 (two) times daily with a meal., Disp: , Rfl:  .  Cholecalciferol (D3 SUPER STRENGTH) 2000 UNITS CAPS, Take by mouth daily., Disp: , Rfl:  .  letrozole (FEMARA) 2.5 MG tablet, Take 1 tablet (2.5 mg total) by mouth daily., Disp: 90 tablet, Rfl: 3 .  Melatonin-Pyridoxine (MELATIN) 3-1 MG TABS, Take 3 mg by mouth at bedtime., Disp: , Rfl:  .  methocarbamol (ROBAXIN) 500 MG tablet, , Disp: , Rfl:  .  Misc Natural Products (OSTEO BI-FLEX ADV DOUBLE ST PO), Take by mouth., Disp: , Rfl:  .  Multiple Vitamins-Minerals (MULTIVITAMIN WITH MINERALS) tablet, Take 1 tablet by mouth daily., Disp: , Rfl:  .  Probiotic Product (ALIGN) 4 MG CAPS, Take 4 mg  by mouth daily., Disp: , Rfl:  .  triamcinolone cream (KENALOG) 0.1 %, Apply 1 application topically 2 (two) times daily., Disp: 30 g, Rfl: 2   Family History  Problem Relation Age of Onset  . Hyperlipidemia Mother   . Heart disease Mother   . Dementia Mother 29       alzheimers  . Cancer Father        tonsil ca  . Hyperlipidemia Sister   . Hyperlipidemia Brother   . Heart disease Maternal Aunt   . Hyperlipidemia Maternal Aunt   . Hypertension Maternal Aunt   . Heart disease Maternal Uncle   . Hyperlipidemia  Maternal Uncle   . Stroke Maternal Uncle   . Hypertension Maternal Uncle   . Cancer Paternal Aunt 35       ovarian ca  . Drug abuse Paternal Aunt   . Cancer Paternal Uncle        lung CA ,  smoker  . Breast cancer Neg Hx      Social History   Tobacco Use  . Smoking status: Former Smoker    Packs/day: 2.00    Types: Cigarettes    Last attempt to quit: 06/10/2003    Years since quitting: 14.5  . Smokeless tobacco: Never Used  . Tobacco comment: social smoker  Substance Use Topics  . Alcohol use: Yes    Comment: occasionally  . Drug use: No    Allergies as of 01/10/2018  . (No Known Allergies)    Review of Systems:    All systems reviewed and negative except where noted in HPI.   Physical Exam:  BP 116/72   Pulse 74   Ht 5' (1.524 m)   Wt 127 lb 12.8 oz (58 kg)   LMP 12/17/2012   BMI 24.96 kg/m  Patient's last menstrual period was 12/17/2012.  General:   Alert,  Well-developed, well-nourished, pleasant and cooperative in NAD Head:  Normocephalic and atraumatic. Eyes:  Sclera clear, no icterus.   Conjunctiva pink. Ears:  Normal auditory acuity. Nose:  No deformity, discharge, or lesions. Mouth:  No deformity or lesions,oropharynx pink & moist. Neck:  Supple; no masses or thyromegaly. Lungs:  Respirations even and unlabored.  Clear throughout to auscultation.   No wheezes, crackles, or rhonchi. No acute distress. Heart:  Regular rate and rhythm; no murmurs, clicks, rubs, or gallops. Abdomen:  Normal bowel sounds. Soft, non-tender and non-distended without masses, hepatosplenomegaly or hernias noted.  No guarding or rebound tenderness.   Rectal: Small pinkish, nontender skin tag.  Palpable, large internal hemorrhoids Msk:  Symmetrical without gross deformities. Good, equal movement & strength bilaterally. Pulses:  Normal pulses noted. Extremities:  No clubbing or edema.  No cyanosis. Neurologic:  Alert and oriented x3;  grossly normal neurologically. Skin:  Intact  without significant lesions or rashes. No jaundice. Psych:  Alert and cooperative. Normal mood and affect.  Imaging Studies: No abdominal imaging  Assessment and Plan:   Patricia Crane is a 47 y.o. Asian female with history of stage II left breast cancer status post partial lumpectomy, radiation and currently on hormonal therapy with chronic severe symptomatic hemorrhoids and chronic constipation  Chronic constipation Provided her with information on high-fiber diet, fiber supplements Given her samples for linaclotide 145 MCG daily  Symptomatic hemorrhoids Discussed with her about hemorrhoid ligation and patient is agreeable Consent obtained, perform hemorrhoid ligation today  Follow up in 2 weeks   Cephas Darby, MD

## 2018-01-10 NOTE — Patient Instructions (Signed)
High-Fiber Diet  Fiber, also called dietary fiber, is a type of carbohydrate found in fruits, vegetables, whole grains, and beans. A high-fiber diet can have many health benefits. Your health care provider may recommend a high-fiber diet to help:  · Prevent constipation. Fiber can make your bowel movements more regular.  · Lower your cholesterol.  · Relieve hemorrhoids, uncomplicated diverticulosis, or irritable bowel syndrome.  · Prevent overeating as part of a weight-loss plan.  · Prevent heart disease, type 2 diabetes, and certain cancers.    What is my plan?  The recommended daily intake of fiber includes:  · 38 grams for men under age 50.  · 30 grams for men over age 50.  · 25 grams for women under age 50.  · 21 grams for women over age 50.    You can get the recommended daily intake of dietary fiber by eating a variety of fruits, vegetables, grains, and beans. Your health care provider may also recommend a fiber supplement if it is not possible to get enough fiber through your diet.  What do I need to know about a high-fiber diet?  · Fiber supplements have not been widely studied for their effectiveness, so it is better to get fiber through food sources.  · Always check the fiber content on the nutrition facts label of any prepackaged food. Look for foods that contain at least 5 grams of fiber per serving.  · Ask your dietitian if you have questions about specific foods that are related to your condition, especially if those foods are not listed in the following section.  · Increase your daily fiber consumption gradually. Increasing your intake of dietary fiber too quickly may cause bloating, cramping, or gas.  · Drink plenty of water. Water helps you to digest fiber.  What foods can I eat?  Grains  Whole-grain breads. Multigrain cereal. Oats and oatmeal. Brown rice. Barley. Bulgur wheat. Millet. Bran muffins. Popcorn. Rye wafer crackers.  Vegetables   Sweet potatoes. Spinach. Kale. Artichokes. Cabbage. Broccoli. Green peas. Carrots. Squash.  Fruits  Berries. Pears. Apples. Oranges. Avocados. Prunes and raisins. Dried figs.  Meats and Other Protein Sources  Navy, kidney, pinto, and soy beans. Split peas. Lentils. Nuts and seeds.  Dairy  Fiber-fortified yogurt.  Beverages  Fiber-fortified soy milk. Fiber-fortified orange juice.  Other  Fiber bars.  The items listed above may not be a complete list of recommended foods or beverages. Contact your dietitian for more options.  What foods are not recommended?  Grains  White bread. Pasta made with refined flour. White rice.  Vegetables  Fried potatoes. Canned vegetables. Well-cooked vegetables.  Fruits  Fruit juice. Cooked, strained fruit.  Meats and Other Protein Sources  Fatty cuts of meat. Fried poultry or fried fish.  Dairy  Milk. Yogurt. Cream cheese. Sour cream.  Beverages  Soft drinks.  Other  Cakes and pastries. Butter and oils.  The items listed above may not be a complete list of foods and beverages to avoid. Contact your dietitian for more information.  What are some tips for including high-fiber foods in my diet?  · Eat a wide variety of high-fiber foods.  · Make sure that half of all grains consumed each day are whole grains.  · Replace breads and cereals made from refined flour or white flour with whole-grain breads and cereals.  · Replace white rice with brown rice, bulgur wheat, or millet.  · Start the day with a breakfast that is high in fiber,   such as a cereal that contains at least 5 grams of fiber per serving.  · Use beans in place of meat in soups, salads, or pasta.  · Eat high-fiber snacks, such as berries, raw vegetables, nuts, or popcorn.  This information is not intended to replace advice given to you by your health care provider. Make sure you discuss any questions you have with your health care provider.  Document Released: 02/05/2005 Document Revised: 07/14/2015 Document Reviewed: 07/21/2013   Elsevier Interactive Patient Education © 2018 [employer] Inc[employer]

## 2018-01-10 NOTE — Progress Notes (Signed)

## 2018-01-29 ENCOUNTER — Encounter: Payer: Self-pay | Admitting: Gastroenterology

## 2018-01-29 ENCOUNTER — Ambulatory Visit (INDEPENDENT_AMBULATORY_CARE_PROVIDER_SITE_OTHER): Payer: 59 | Admitting: Gastroenterology

## 2018-01-29 ENCOUNTER — Ambulatory Visit: Payer: 59 | Admitting: Gastroenterology

## 2018-01-29 VITALS — BP 112/70 | HR 78 | Ht 60.0 in | Wt 127.2 lb

## 2018-01-29 DIAGNOSIS — K640 First degree hemorrhoids: Secondary | ICD-10-CM | POA: Diagnosis not present

## 2018-01-29 NOTE — Progress Notes (Signed)

## 2018-02-04 ENCOUNTER — Encounter: Payer: Self-pay | Admitting: Hematology and Oncology

## 2018-03-17 ENCOUNTER — Encounter: Payer: Self-pay | Admitting: Gastroenterology

## 2018-03-17 ENCOUNTER — Other Ambulatory Visit: Payer: Self-pay

## 2018-03-17 ENCOUNTER — Ambulatory Visit: Payer: 59 | Admitting: Gastroenterology

## 2018-03-17 VITALS — BP 111/69 | HR 81 | Ht 60.0 in | Wt 129.8 lb

## 2018-03-17 DIAGNOSIS — K641 Second degree hemorrhoids: Secondary | ICD-10-CM

## 2018-03-17 NOTE — Progress Notes (Signed)
PROCEDURE NOTE: The patient presents with symptomatic grade 2 hemorrhoids, unresponsive to maximal medical therapy, requesting rubber band ligation of his/her hemorrhoidal disease.  All risks, benefits and alternative forms of therapy were described and informed consent was obtained.  The decision was made to band the RA internal hemorrhoid, and the [facility]'Regan System was used to perform band ligation without complication.  Digital anorectal examination was then performed to assure proper positioning of the band, and to adjust the banded tissue as required.  The patient was discharged home without pain or other issues.  Dietary and behavioral recommendations were given and (if necessary - prescriptions were given), along with follow-up instructions.  The patient will return as needed for follow-up and possible additional banding as required.  No complications were encountered and the patient tolerated the procedure well.   

## 2018-03-21 ENCOUNTER — Ambulatory Visit (INDEPENDENT_AMBULATORY_CARE_PROVIDER_SITE_OTHER): Payer: 59 | Admitting: Internal Medicine

## 2018-03-21 ENCOUNTER — Encounter: Payer: Self-pay | Admitting: Internal Medicine

## 2018-03-21 VITALS — BP 96/62 | HR 76 | Temp 98.2°F | Resp 15 | Ht 60.0 in | Wt 127.0 lb

## 2018-03-21 DIAGNOSIS — Z8639 Personal history of other endocrine, nutritional and metabolic disease: Secondary | ICD-10-CM

## 2018-03-21 DIAGNOSIS — N393 Stress incontinence (female) (male): Secondary | ICD-10-CM

## 2018-03-21 DIAGNOSIS — E782 Mixed hyperlipidemia: Secondary | ICD-10-CM | POA: Diagnosis not present

## 2018-03-21 DIAGNOSIS — Z0000 Encounter for general adult medical examination without abnormal findings: Secondary | ICD-10-CM

## 2018-03-21 NOTE — Patient Instructions (Signed)
I have ordered labs to be done with Dr Kem Parkinson labs in march:  CMET Lipid TSH   Your next PAP smear is due in 2021  I will order your PT referral

## 2018-03-21 NOTE — Progress Notes (Signed)
Patient ID: Patricia Crane, female    DOB: 08/01/1970  Age: 48 y.o. MRN: [id number]  The patient is here for annual preventive examination and management of other chronic and acute problems  Last seen Sept 2018 .   Colonoscopy on Oct 2019 for rectal bleeding.  Internal hemorrhoids found.  Referred to [name] for banding . Has had 2 of 3 procedures  Breast ca left side 2014 follow up with [facility] sept 2019 . Declined chemo,  Had XRT and Trelstar changed to Lupron  , was on aromasin changed to Femara due to cost, she is  tolerating   femara   Osteoporosis  2017 DEXA On fosamax by choice, taking 2000 Ius daily of D3   PAP SMEAR NORMAL  2018    The risk factors are reflected in the social history.  The roster of all physicians providing medical care to patient - is listed in the Snapshot section of the chart.  Activities of daily living:  The patient is 100% independent in all ADLs: dressing, toileting, feeding as well as independent mobility  Home safety : The patient has smoke detectors in the home. They wear seatbelts.  There are no firearms at home. There is no violence in the home.   There is no risks for hepatitis, STDs or HIV. There is no   history of blood transfusion. They have no travel history to infectious disease endemic areas of the world.  The patient has seen their dentist in the last six month. They have seen their eye doctor in the last year. They admit to slight hearing difficulty with regard to whispered voices and some television programs.  They have deferred audiologic testing in the last year.  They do not  have excessive sun exposure. Discussed the need for sun protection: hats, long sleeves and use of sunscreen if there is significant sun exposure.   Diet: the importance of a healthy diet is discussed. They do have a healthy diet.  The benefits of regular aerobic exercise were discussed. She walks 4 times per week ,  20 minutes.   Depression screen: there are no signs  or vegative symptoms of depression- irritability, change in appetite, anhedonia, sadness/tearfullness.  Cognitive assessment: the patient manages all their financial and personal affairs and is actively engaged. They could relate day,date,year and events; recalled 2/3 objects at 3 minutes; performed clock-face test normally.  The following portions of the patient's history were reviewed and updated as appropriate: allergies, current medications, past family history, past medical history,  past surgical history, past social history  and problem list.  Visual acuity was not assessed per patient preference since she has regular follow up with her ophthalmologist. Hearing and body mass index were assessed and reviewed.   During the course of the visit the patient was educated and counseled about appropriate screening and preventive services including : fall prevention , diabetes screening, nutrition counseling, colorectal cancer screening, and recommended immunizations.    CC: The primary encounter diagnosis was Moderate mixed hyperlipidemia not requiring statin therapy. Diagnoses of History of Graves' disease, Stress incontinence, Encounter for preventive health examination, and Stress incontinence, female were also pertinent to this visit.  SOME STRESS INCONTINence.  Wants to see [name]  History Anneli has a past medical history of Breast cancer ([place]) (2014), Chicken pox, Heart murmur, Personal history of radiation therapy (2014), Shingles, Skin cancer, and Thyroid disease.   She has a past surgical history that includes lasix (Bilateral); Breast biopsy (Left, 06/2012);  Breast biopsy (Right, 04/22/2013); Breast lumpectomy (Left, 08/18/2012); and Colonoscopy with propofol (N/A, 12/11/2017).   Her family history includes Cancer in her father and paternal uncle; Cancer (age of onset: 58) in her paternal aunt; Dementia (age of onset: 71) in her mother; Drug abuse in her paternal aunt; Heart disease in  her maternal aunt, maternal uncle, and mother; Hyperlipidemia in her brother, maternal aunt, maternal uncle, mother, and sister; Hypertension in her maternal aunt and maternal uncle; Stroke in her maternal uncle.She reports that she quit smoking about 14 years ago. Her smoking use included cigarettes. She smoked 2.00 packs per day. She has never used smokeless tobacco. She reports current alcohol use. She reports that she does not use drugs.  Outpatient Medications Prior to Visit  Medication Sig Dispense Refill  . alendronate (FOSAMAX) 70 MG tablet TAKE 1 TABLET BY MOUTH ONCE A WEEK ON AN EMPTY STOMACH WITH FULL GLASS OF WATER 12 tablet 9  . calcium carbonate (OS-CAL) 600 MG TABS tablet Take 600 mg by mouth 2 (two) times daily with a meal.    . Cholecalciferol (D3 SUPER STRENGTH) 2000 UNITS CAPS Take by mouth daily.    Marland Kitchen letrozole (FEMARA) 2.5 MG tablet Take 1 tablet (2.5 mg total) by mouth daily. 90 tablet 3  . Melatonin-Pyridoxine (MELATIN) 3-1 MG TABS Take 3 mg by mouth at bedtime.    . methocarbamol (ROBAXIN) 500 MG tablet     . Misc Natural Products (OSTEO BI-FLEX ADV DOUBLE ST PO) Take by mouth.    . Multiple Vitamins-Minerals (MULTIVITAMIN WITH MINERALS) tablet Take 1 tablet by mouth daily.    . Probiotic Product (ALIGN) 4 MG CAPS Take 4 mg by mouth daily.    Marland Kitchen triamcinolone cream (KENALOG) 0.1 % Apply 1 application topically 2 (two) times daily. 30 g 2  . VITAMIN D PO Take by mouth.    . ALPRAZolam (XANAX) 0.25 MG tablet Take 1 tablet (0.25 mg total) by mouth 2 (two) times daily as needed for sleep or anxiety. 60 tablet 2   No facility-administered medications prior to visit.     Review of Systems   Patient denies headache, fevers, malaise, unintentional weight loss, skin rash, eye pain, sinus congestion and sinus pain, sore throat, dysphagia,  hemoptysis , cough, dyspnea, wheezing, chest pain, palpitations, orthopnea, edema, abdominal pain, nausea, melena, diarrhea, constipation, flank  pain, dysuria, hematuria, urinary  Frequency, nocturia, numbness, tingling, seizures,  Focal weakness, Loss of consciousness,  Tremor, insomnia, depression, anxiety, and suicidal ideation.      Objective:  BP 96/62 (BP Location: Right Arm, Patient Position: Sitting, Cuff Size: Normal)   Pulse 76   Temp 98.2 F (36.8 C) (Oral)   Resp 15   Ht 5' (1.524 m)   Wt 127 lb (57.6 kg)   LMP 12/17/2012   SpO2 98%   BMI 24.80 kg/m   Physical Exam  General appearance: alert, cooperative and appears stated age Head: Normocephalic, without obvious abnormality, atraumatic Eyes: conjunctivae/corneas clear. PERRL, EOM's intact. Fundi benign. Ears: normal TM's and external ear canals both ears Nose: Nares normal. Septum midline. Mucosa normal. No drainage or sinus tenderness. Throat: lips, mucosa, and tongue normal; teeth and gums normal Neck: no adenopathy, no carotid bruit, no JVD, supple, symmetrical, trachea midline and thyroid not enlarged, symmetric, no tenderness/mass/nodules Lungs: clear to auscultation bilaterally Breasts: left breast with surgical scar, no masses or tenderness Heart: regular rate and rhythm, S1, S2 normal, no murmur, click, rub or gallop Abdomen: soft, non-tender; bowel  sounds normal; no masses,  no organomegaly Extremities: extremities normal, atraumatic, no cyanosis or edema Pulses: 2+ and symmetric Skin: Skin color, texture, turgor normal. No rashes or lesions Neurologic: Alert and oriented X 3, normal strength and tone. Normal symmetric reflexes. Normal coordination and gait.     Assessment & Plan:   Problem List Items Addressed This Visit    Encounter for preventive health examination    age appropriate education and counseling updated, referrals for preventative services and immunizations addressed, dietary and smoking counseling addressed, most recent labs reviewed.  I have personally reviewed and have noted:  1) the patient's medical and social history 2)  The pt's use of alcohol, tobacco, and illicit drugs 3) The patient's current medications and supplements 4) Functional ability including ADL's, fall risk, home safety risk, hearing and visual impairment 5) Diet and physical activities 6) Evidence for depression or mood disorder 7) The patient's height, weight, and BMI have been recorded in the chart  I have made referrals, and provided counseling and education based on review of the above      History of Graves' disease    Treated with PTU for 2 years in 2009.  With resolution .  Semi annual thyrodi function screens have been normal  Lab Results  Component Value Date   TSH 1.039 10/24/2017     Lab Results  Component Value Date   TSH 1.039 10/24/2017         Relevant Orders   TSH   Stress incontinence, female    Referral for pelvic floor PT with Talmadge Chad in process.        Other Visit Diagnoses    Moderate mixed hyperlipidemia not requiring statin therapy    -  Primary   Relevant Orders   Comprehensive metabolic panel   Lipid panel   Stress incontinence       Relevant Orders   Ambulatory referral to Physical Therapy      I have discontinued Gerome Sam "Peggy"'s ALPRAZolam. I am also having her maintain her Melatonin-Pyridoxine, ALIGN, multivitamin with minerals, Cholecalciferol, Misc Natural Products (OSTEO BI-FLEX ADV DOUBLE ST PO), calcium carbonate, methocarbamol, triamcinolone cream, alendronate, letrozole, and VITAMIN D PO.  No orders of the defined types were placed in this encounter.   Medications Discontinued During This Encounter  Medication Reason  . ALPRAZolam (XANAX) 0.25 MG tablet     Follow-up: Return in about 1 year (around 03/22/2019).   [name], MD

## 2018-03-23 DIAGNOSIS — N393 Stress incontinence (female) (male): Secondary | ICD-10-CM | POA: Insufficient documentation

## 2018-03-23 NOTE — Assessment & Plan Note (Signed)
Treated with PTU for 2 years in 2009.  With resolution .  Semi annual thyrodi function screens have been normal  Lab Results  Component Value Date   TSH 1.039 10/24/2017     Lab Results  Component Value Date   TSH 1.039 10/24/2017

## 2018-03-23 NOTE — Assessment & Plan Note (Signed)

## 2018-03-23 NOTE — Assessment & Plan Note (Signed)
Referral for pelvic floor PT with Talmadge Chad in process.

## 2018-04-10 ENCOUNTER — Other Ambulatory Visit: Payer: 59

## 2018-04-10 ENCOUNTER — Ambulatory Visit: Payer: 59 | Admitting: Hematology and Oncology

## 2018-04-25 ENCOUNTER — Other Ambulatory Visit: Payer: Self-pay

## 2018-04-25 ENCOUNTER — Encounter: Payer: Self-pay | Admitting: Gastroenterology

## 2018-04-25 ENCOUNTER — Ambulatory Visit: Payer: 59 | Admitting: Gastroenterology

## 2018-04-25 VITALS — BP 109/69 | HR 79 | Resp 17 | Ht 60.0 in | Wt 127.0 lb

## 2018-04-25 DIAGNOSIS — K641 Second degree hemorrhoids: Secondary | ICD-10-CM

## 2018-04-25 NOTE — Progress Notes (Signed)

## 2018-05-06 MED ORDER — VALACYCLOVIR HCL 500 MG PO TABS: 500.0000 mg | ORAL_TABLET | Freq: Every day | ORAL | 1 refills | Status: DC

## 2018-05-06 MED ORDER — ALPRAZOLAM 0.25 MG PO TABS: 0.2500 mg | ORAL_TABLET | Freq: Two times a day (BID) | ORAL | 1 refills | Status: DC | PRN

## 2018-05-06 MED ORDER — MELOXICAM 15 MG PO TABS: 15.0000 mg | ORAL_TABLET | Freq: Every day | ORAL | 1 refills | Status: DC

## 2018-05-08 ENCOUNTER — Encounter: Payer: Self-pay | Admitting: Physical Therapy

## 2018-05-08 ENCOUNTER — Ambulatory Visit: Payer: 59 | Admitting: Urgent Care

## 2018-05-08 ENCOUNTER — Other Ambulatory Visit: Payer: 59

## 2018-05-08 ENCOUNTER — Ambulatory Visit: Payer: 59 | Attending: Internal Medicine | Admitting: Physical Therapy

## 2018-05-08 ENCOUNTER — Ambulatory Visit: Payer: 59 | Admitting: Hematology and Oncology

## 2018-05-08 ENCOUNTER — Other Ambulatory Visit: Payer: Self-pay

## 2018-05-08 DIAGNOSIS — M62838 Other muscle spasm: Secondary | ICD-10-CM | POA: Diagnosis present

## 2018-05-08 DIAGNOSIS — R278 Other lack of coordination: Secondary | ICD-10-CM | POA: Diagnosis not present

## 2018-05-08 DIAGNOSIS — G4730 Sleep apnea, unspecified: Secondary | ICD-10-CM | POA: Insufficient documentation

## 2018-05-08 NOTE — Patient Instructions (Addendum)
To minimize hard stools, be sure to hydrate: Increase water intake from 30 fl oz to 60 fl oz ( 1/2 your body weight) .   Vulvar Care: Irritants/Allergens ( on exposure, can cause immediate stinging or burning)  Body fluids: urine, feces, vaginal discharge, sweat, semen  Feminine Hygiene Products: douches, yogurt douche, feminine wipes, baby wipes, sanitary pads, panty liners, tampons, deodorants (including deodorants in tampons/pads), lotions, powders (including talcum powder), perfumes, shampoos,soaps  Sexual support: lubricants, condoms, diaphragms, spermicides, arousal stimulants  Laundry detergent, bleach, fabric softener  Cleansing products: soap, bubble baths and salts, shampoo, conditioner, perfumed toielt paper  Physical irritants: tight fitting clothes, Nylon underwear, synthetic undergarments/pantyhose, chemically treated clothing, latex, wash cloths, sponges, hot water, excessive washing, vigorous drying with towel/hair dryer  HPV medication, tea tree oil, [place]  Alcohol and astringents  Topical medicaments: Benzocaine, Neomycin, Chlorhexidine (in K-Y Jelly), Imidazole antifungal, Propylene glycol ( Preservative used in many products:, tea tree oil, preservatives and bases medications are placed in.   Adapted from the V Book by Noland Fordyce. Nicole Kindred, MD., and Lynnell Dike [facility] Books, 2002), and Proceedings in Obstetrics and Gynecology, 2014; 4(2):1    Gentle Vulvar care- Adapted from the National Vulvodynia Association and the V Book  Wear loose clothing.  Wear all-cotton underwear, and go without when at home. Avoid wearing pantyhose, wear thigh high or knee high pantyhose. If you do not use underwear with yoga pants, try using an all-cotton against the vulva. Tight clothing can be irritating to the vulva by increasing friction and exacerbate irritation and redness.  Wear loose fitting clothes when possible. When swimming, remove wet bathing suits and shower to  remove cholerine from the area.Marland Kitchen  Avoid hot tubs and heavily chlorinated pools. Shower after exercise to eliminate excess consider avoiding exercise that causes friction or pressure to the area such as horseback riding, bicycle riding, and spinning classes.   To cleanse the area, use your fingers instead of a washcloth and plain water.  If you feel you must use a cleanser, unscented, non-alkaline cleanser such as Cetaphil, or mild soaps made for sensitive skin such as Dove or Neurtrogena. Be sure any cleaners are unscented. Avoid rubbing the vulva. Soak for five minutes in lukewarm water to remove any residue of sweat or lotions or other products.  Pat dry, and apply any prescribed medication. Avoid products with multiple ingredients.  Even those that sound designed for vulvar care, like A& D Original Ointment, baby lotion, or Vagisil, contain chemicals that could irritate or cause contact dermatitis. Avoid using bubble bath, feminine hygiene sprays, or any type of perfumed creams or sprays near the vulva.  In the bathroom, forgo moistened wipes. If you want moisture, use a spray bottle with plain water, and then pat dry. Use soft, white, unscented toilet paper. Avoid repetitive back and forth motion or rubbing. Drink plenty of water and fluids to dilute urine. Concentrated urine can be irritating to the vulvar skin.   When doing laundry, use hypoallergenic laundry detergent that is very mild, such as those made for babies. Avoid using fabric softeners with undergarments and double rinse undergarments.  For sexual intercourse, be sure to use a lubricant that is without additives or preservatives.  Additives like bactericides, spermicides, warming agents, and flavors can cause vulvar irritation. Rinse the vulva after intercourse. A frozen gel pack wrapped in a towel can be used for irritation after exercise or intercourse.    Moisturizers .  They are used in the vagina to hydrate the mucous  membrane that make up the vaginal canal. .             Designed to keep a more normal acid balance (ph) .             Once placed in the vagina, it will last between two to three days. .             Use 2-3 times per week at bedtime and last longer than 60 min. .             Ingredients to avoid is glycerin and fragrance, can increase chance of infection .             Should not be used just before sex due to causing irritation .             Most are gels administered either in a tampon-shaped applicator or as a vaginal suppository.                They  are non-hormonal.    Vaginal moisturization vs Lubricant Types of Moisturizers .             Samul Dada- drug store .             Vitamin E vaginal suppositories- Whole foods, Amazon .             Moist Again .             Coconut oil- can break down condoms .             Julva- (Do no use if on Tamoxifen) amazon .             Yes moisturizer- amazon .             NeuEve Silk , NeuEve Silver for menopausal or over 65 (if have severe vaginal atrophy or cancer                treatments use NeuEve Silk for  1 month than move to [employer])- [employer], [email or web address] .             Olive and Bee intimate cream- [email or web address] .             Mae vaginal moisturizer- Amazon      Lubrication .             Used for intercourse to reduce friction .             Avoid ones that have glycerin, warming gels, tingling gels, icing or cooling gel, scented .             Avoid parabens due to a preservative similar to female sex hormone .             May need to be reapplied once or several times during sexual activity .             Can be applied to both partners genitals prior to vaginal penetration to minimize friction or irritation .             Prevent irritation and mucosal tears that cause post coital pain and increased the risk of vaginal and                urinary tract infections .             Oil-based lubricants cannot be used with condoms due  to breaking them down.  Least likely to irritate                vaginal tissue. .             Plant based-lubes are safe .             Silicone-based lubrication are thicker and last long and used for post-menopausal women   Vaginal Lubricators Here is a list of some suggested lubricators you can use for intercourse. Use the most hypoallergenic product.  You can place on you or your partner. Glori Bickers Stuff     Sylk or Sliquid Natural H2O ( good  if frequent UTI's)                Blossom Organics ([email or web address])                Luvena                Coconut oil                PJur Woman Nude- water based lubricant, amazon                Uberlube- Amazon                Aloe Vera                Yes lubricant- [employer] Platinum-Silicone, [employer], [facility]                Olive and Bee intimate cream-  [email or web address]  Things to avoid in lubricants are glycerin, warming gels, tingling gels, icing or cooling gels, and scented gels.  Also avoid Vaseline. KY jelly, Replens, and Astroglide kills good bacteria(lactobacilli)   Things to avoid in the vaginal area .             Do not use things to irritate the vulvar area .             No lotions- see below .             No soaps; can use Aveeno or Calendula cleanser if needed. Must be gentle .             No deodorants .             No douches .             Good to sleep without underwear to let the vaginal area to air out .             No scrubbing: spread the lips to let warm water rinse over labias and pat dry    ____ Morning and night:  Stretch for pelvic floor   "v heels slide away and then back toward buttocks and then rock knee to slight ,  slide heel along at 11 o clock away from buttocks   10 reps    ____    Deep core level 1 ( handout)

## 2018-05-09 ENCOUNTER — Ambulatory Visit: Payer: 59 | Admitting: Gastroenterology

## 2018-05-09 NOTE — Therapy (Addendum)
[place] MAIN [facility] SERVICES [place], [place], [address] Phone: [phone]   Fax:  [phone]  Physical Therapy Evaluation / Discharge asummary  Patient Details  Name: Patricia Crane MRN: [id number] Date of Birth: 1970-09-14 No data recorded  Encounter Date: 05/08/2018    Past Medical History:  Diagnosis Date  . Breast cancer ([place]) 2014    breast invasive mammary carcinoma  . Chicken pox   . Constipation 05/19/2013  . Heart murmur   . Personal history of radiation therapy 2014   left breast ca  . Shingles   . Skin cancer   . Thyroid disease     Past Surgical History:  Procedure Laterality Date  . BREAST BIOPSY Left 06/2012   invasive mammary  . BREAST BIOPSY Right 04/22/2013   neg- core  . BREAST LUMPECTOMY Left 08/18/2012   invasive mammary carcinoma. Margins close < 0.110m but negative, LN negative  . COLONOSCOPY WITH PROPOFOL N/A 12/11/2017   Procedure: COLONOSCOPY WITH PROPOFOL;  Surgeon: AJonathon Bellows MD;  [facility];  Service: Gastroenterology;  Laterality: N/A;  . HEMORRHOID BANDING    . lasix Bilateral     There were no vitals filed for this visit.   Subjective Assessment - 05/12/18 1058    Subjective 1) Urinary incontinence/ urgency:  Pt noticed leakage and urge 2 years ago. Urge to urinate occurred after a strained bowel movement. Leakage occured in small amounts and does not require wearing maxi pads.  It has been harder to control urination.  Frequnecy once in every 1.5 hours.   2) Straining with bowel movements: Prior to hemorrhoid banding, pt had difficulty with bowel movements requiring straining. Pt had constipation due to medication that she has to be on for 5 years. Fluid intake: 30 fl oz of water, one coffee, 2 teas of 10 fl oz. 1-2 bowel movements per day. When travelling, it takes 1 week.  Prior to banding, Stool Type 4 50% of the time, Type 1-3, 5 the other 50% of the time.   Currently after banding, pt is having better bowel movements with less straining. Pt strains 10% of the time. Stool Type 4.   3) Sometimes R LBP with gardening and lots of labor. Hx of LBP for 5 years. LBP started with lifting boxes at work. Denied radiating pain. 4/10.  Currently no pain.    4) pain with intercourse started with menopause.  Does not use any moisturizer for the vulvar area.         Pertinent History  Breast lumpectomy for CA with radiation, declined chemo 2014.  Hemorrhoid banding surgery 2019-2020.  No children.  Sleep apnea Dx a few years ago but has not follow up with CPAP as recommended.      Patient Stated Goals  Learn ways to make the pelvic floor stronger          [facility] 05/12/18 1058      Coordination   Gross Motor Movements are Fluid and Coordinated  --   chest breathing,      Single Leg Stance   Comments  no trendelenberg       Palpation   SI assessment   levelled pelvic girdle                 Objective measurements completed on examination: See above findings.      [facility] - 05/12/18 1058      Therapeutic Activites  ADL's  simulated toileting posture: with practice on lengthening pelvic floor,  body scan for relaxation       Neuro Re-ed    Neuro Re-ed Details   pelvic floor and diaphramgatic lengthening , tactile cues for pelvic floor stretches ( see pt instructions)       Manual Therapy   Internal Pelvic Floor  STM/MWM at obt int B                PT Short Term Goals - 05/12/18 1052      PT SHORT TERM GOAL #1   Title  Pt will complete the ICIQ-UI form      Time  1    Period  Weeks    Status  Achieved    Target Date  05/19/18      PT SHORT TERM GOAL #2   Title  Pt will demo proper pelvic floor lengthening in order to regain urinary control     Time  1    Period  Weeks    Status  Achieved    Target Date  05/19/18      PT SHORT TERM GOAL #3   Title  Pt will demo decreasded  pelvic floor tightness in order to minimize urinary leakage and straining with bowel movements     Time  1    Period  Weeks    Status  Achieved            Plan - 05/09/18 0841    Clinical Impression Statement  Pt is a 48 yo female who reports of urinary incontinence, straining with bowel movements, CLBP, and pain with sexual incontinence. Pt's clinical presentations include pelvic floor dysfunctions associated with overactivity pelvic floor mm tightness and dyscoordination of pelvic floor and deep core mm.  Following Tx today, pt demo'd decreased tightness and tenderness at pelvic floor mm and demonstrated improved mobility of pelvic floor and was progressed to deep core coordination.  Pt was provided education on etiology of Sx with anatomy, physiology explanation with images along with the benefits of customized pelvic PT Tx based on pt's medical conditions and musculoskeletal deficits. Pt was provided samples for vaginal moisturizer and lubricants along with education material as pt may be experiencing changes to vulvar tissue 2/2 breast CA and related Tx. Pt was educated on proper lengthening technique of pelvic floor to minimize straining with bowel movements with use of elevated stool for optimal pelvic alignment. Pt was also educated the importance to f/u with use of CPAP machine with her OSA diagnosis and the implications of her cardiac conditions on increased risks associated with untreated OSA. Pt voiced understanding.   Pt requested shorter number of visits due to expiration of her insurance however, her remaining appts were cancelled due to closure of Rehab Department 2/2 to policies related to [id number]-08 safety regulations. Pt is being d/c at this time as pt has met her STG.        Personal Factors and Comorbidities  Comorbidity 3+    Comorbidities  breast CA, sleep apea, mitral valve condition, arthritis, basal cell carcinoma, osteroporosis     Stability/Clinical Decision Making   Stable/Uncomplicated    Clinical Decision Making  Low    Rehab Potential  Good    PT Frequency  1x / week    PT Duration  3 weeks    PT Treatment/Interventions  Moist Heat;Therapeutic exercise;Neuromuscular re-education;Patient/family education;Manual techniques;Scar mobilization;Taping;Therapeutic activities;Gait training;Energy conservation;Manual lymph drainage    Consulted and Agree with Plan  of Care  Patient           Patient will benefit from skilled therapeutic intervention in order to improve the following deficits and impairments:  Decreased strength, Decreased endurance, Hypomobility, Increased muscle spasms, Postural dysfunction, Improper body mechanics, Difficulty walking, Decreased mobility, Decreased coordination, Decreased safety awareness, Pain  Visit Diagnosis: Other muscle spasm  Other lack of coordination     Problem List Patient Active Problem List   Diagnosis Date Noted  . Sleep apnea 05/08/2018  . Stress incontinence, female 03/23/2018  . Fatigue 11/02/2016  . 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 Lymphedema of left arm 04/07/2016  . Osteoporosis 06/03/2014  . Snoring 02/03/2014  . Bilateral myofascial pain 02/03/2014  . Basal cell carcinoma of cheek 05/19/2013  . BD (Bowen's disease) 03/03/2013  . Herpes simplex infection of skin 02/19/2013  . Contact dermatitis and other eczema due to detergents 09/17/2012  . Anemia 09/16/2012  . Malignant neoplasm of breast, stage 2 ([place]) 06/25/2012  . Unspecified vitamin D deficiency 06/09/2012  . Arthritis of both knees 06/09/2012  . Mitral valve prolapse syndrome 06/09/2012  . Wrist pain, chronic 06/09/2012  . History of Graves' disease 06/09/2012    Jerl Mina ,PT, DPT, E-RYT  05/12/2018, 10:59 AM  [place] MAIN [facility] SERVICES [address] [place], [place], [address] Phone: [phone]   Fax:  [phone]  Name: Brantleigh Mifflin MRN:  [id number] Date of Birth: 1970/11/29

## 2018-05-12 NOTE — Addendum Note (Signed)
Addended by: Jerl Mina on: 05/12/2018 12:16 PM   Modules accepted: Orders

## 2018-05-13 ENCOUNTER — Ambulatory Visit: Payer: 59

## 2018-05-15 ENCOUNTER — Encounter: Payer: 59 | Admitting: Physical Therapy

## 2018-05-16 ENCOUNTER — Other Ambulatory Visit: Payer: Self-pay

## 2018-05-17 NOTE — Progress Notes (Signed)
[facility]     [address]     [place], [place] [address]     Phone: [phone]      Fax: [phone]        Clinic day:  05/19/2018    Chief Complaint: Patricia Crane is a 48 y.o. female with stage IIA left breast cancer who is seen for 6 month assessment.  HPI: The patient was last seen in the medical oncology clinic on 10/24/2017.  At that time, she felt a little tired.  Exam was unremarkable.  CBC, CMP, CA27.29, and TSH were normal.  Bilateral breast MRI on 12/02/2017 revealed no abnormal enhancement in either breast.  During the interim, she has felt good.  Energy level is better.  She denies any concerns.  She is performing monthly breast exams.  Last menstrual period was 11/2012.   Past Medical History:  Diagnosis Date  . Breast cancer ([place]) 2014    breast invasive mammary carcinoma  . Chicken pox   . Constipation 05/19/2013  . Heart murmur   . Personal history of radiation therapy 2014   left breast ca  . Shingles   . Skin cancer   . Thyroid disease     Past Surgical History:  Procedure Laterality Date  . BREAST BIOPSY Left 06/2012   invasive mammary  . BREAST BIOPSY Right 04/22/2013   neg- core  . BREAST LUMPECTOMY Left 08/18/2012   invasive mammary carcinoma. Margins close < 0.91m but negative, LN negative  . COLONOSCOPY WITH PROPOFOL N/A 12/11/2017   Procedure: COLONOSCOPY WITH PROPOFOL;  Surgeon: AJonathon Bellows MD;  [facility];  Service: Gastroenterology;  Laterality: N/A;  . HEMORRHOID BANDING    . lasix Bilateral     Family History  Problem Relation Age of Onset  . Hyperlipidemia Mother   . Heart disease Mother   . Dementia Mother 736      alzheimers  . Cancer Father        tonsil ca  . Hyperlipidemia Sister   . Hyperlipidemia Brother   . Heart disease Maternal Aunt   . Hyperlipidemia Maternal Aunt   . Hypertension Maternal Aunt   . Heart disease Maternal Uncle   . Hyperlipidemia Maternal  Uncle   . Stroke Maternal Uncle   . Hypertension Maternal Uncle   . Cancer Paternal Aunt 519      ovarian ca  . Drug abuse Paternal Aunt   . Cancer Paternal Uncle        lung CA ,  smoker  . Breast cancer Neg Hx     Social History:  reports that she quit smoking about 14 years ago. Her smoking use included cigarettes. She smoked 2.00 packs per day. She has never used smokeless tobacco. She reports current alcohol use. She reports that she does not use drugs.  [place]  She has just returned from [place] She works in the [employer] The patient is alone today.  Allergies: No Known Allergies  Current Medications: Current Outpatient Medications  Medication Sig Dispense Refill  . alendronate (FOSAMAX) 70 MG tablet TAKE 1 TABLET BY MOUTH ONCE A WEEK ON AN EMPTY STOMACH WITH FULL GLASS OF WATER 12 tablet 9  . calcium carbonate (OS-CAL) 600 MG TABS tablet Take 600 mg by mouth 2 (two) times daily with a meal.    . Cholecalciferol (D3 SUPER STRENGTH) 2000 UNITS CAPS Take  by mouth daily.    Marland Kitchen letrozole (FEMARA) 2.5 MG tablet Take 1 tablet (2.5 mg total) by mouth daily. 90 tablet 3  . Melatonin-Pyridoxine (MELATIN) 3-1 MG TABS Take 3 mg by mouth at bedtime.    . meloxicam (MOBIC) 15 MG tablet Take 1 tablet (15 mg total) by mouth daily. 90 tablet 1  . Misc Natural Products (OSTEO BI-FLEX ADV DOUBLE ST PO) Take by mouth.    . Multiple Vitamins-Minerals (MULTIVITAMIN WITH MINERALS) tablet Take 1 tablet by mouth daily.    . Probiotic Product (ALIGN) 4 MG CAPS Take 4 mg by mouth daily.    Marland Kitchen ALPRAZolam (XANAX) 0.25 MG tablet Take 1 tablet (0.25 mg total) by mouth 2 (two) times daily as needed for sleep or anxiety. (Patient not taking: Reported on 05/19/2018) 60 tablet 1  . methocarbamol (ROBAXIN) 500 MG tablet     . triamcinolone cream (KENALOG) 0.1 % Apply 1 application topically 2 (two) times daily. (Patient not taking: Reported on 05/19/2018) 30 g  2  . valACYclovir (VALTREX) 500 MG tablet Take 1 tablet (500 mg total) by mouth daily. (Patient not taking: Reported on 05/19/2018) 90 tablet 1  . VITAMIN D PO Take by mouth.     No current facility-administered medications for this visit.     Review of Systems:  GENERAL:  Energy level is better.  No fevers, sweats. Weight loss of 1 pound. PERFORMANCE STATUS (ECOG):  0 HEENT:  No visual changes, runny nose, sore throat, mouth sores or tenderness. Lungs: No shortness of breath or cough.  No hemoptysis. Cardiac:  No chest pain, palpitations, orthopnea, or PND. GI:  Constipation.  No nausea, vomiting, diarrhea, melena or hematochezia.  Colonoscopy 12/11/2017. GU:  No urgency, frequency, dysuria, or hematuria. Musculoskeletal:  No back pain.  No joint pain.  No muscle tenderness. Extremities:  No pain or swelling. Skin:  No rashes or skin changes. Neuro:  No headache, numbness or weakness, balance or coordination issues. Endocrine:  No diabetes, thyroid issues, hot flashes or night sweats. Psych:  No mood changes, depression or anxiety. Pain:  No focal pain. Review of systems:  All other systems reviewed and found to be negative.   Physical Exam: Blood pressure 112/72, pulse 66, temperature 98.1 F (36.7 C), temperature source Tympanic, resp. rate 18, height 5' (1.524 m), weight 126 lb 5.2 oz (57.3 kg), last menstrual period 12/17/2012, SpO2 100 %. GENERAL:  Well developed, well nourished, woman sitting comfortably in the exam room in no acute distress. MENTAL STATUS:  Alert and oriented to person, place and time. HEAD:  Long dark hair.  Normocephalic, atraumatic, face symmetric, no Cushingoid features. EYES:  Brown eyes.  Pupils equal round and reactive to light and accomodation.  No conjunctivitis or scleral icterus. ENT:  Oropharynx clear without lesion.  Tongue normal. Mucous membranes moist.  RESPIRATORY:  Clear to auscultation without rales, wheezes or rhonchi. CARDIOVASCULAR:   Regular rate and rhythm without murmur, rub or gallop. BREAST:  Right breast without masses, skin changes or nipple discharge.  Left breast with post-operative changes.  No discrete masses, skin changes or nipple discharge. ABDOMEN:  Soft, non-tender, with active bowel sounds, and no hepatosplenomegaly.  No masses. SKIN:  No rashes, ulcers or lesions. EXTREMITIES: No edema, no skin discoloration or tenderness.  No palpable cords. LYMPH NODES: No palpable cervical, supraclavicular, axillary or inguinal adenopathy  NEUROLOGICAL: Unremarkable. PSYCH:  Appropriate.    Appointment on 05/19/2018  Component Date Value Ref Range Status  .  Sodium 05/19/2018 140  135 - 145 mmol/L Final  . Potassium 05/19/2018 3.9  3.5 - 5.1 mmol/L Final  . Chloride 05/19/2018 105  98 - 111 mmol/L Final  . CO2 05/19/2018 27  22 - 32 mmol/L Final  . Glucose, Bld 05/19/2018 105* 70 - 99 mg/dL Final  . BUN 05/19/2018 14  6 - 20 mg/dL Final  . Creatinine, Ser 05/19/2018 0.67  0.44 - 1.00 mg/dL Final  . Calcium 05/19/2018 9.2  8.9 - 10.3 mg/dL Final  . Total Protein 05/19/2018 7.5  6.5 - 8.1 g/dL Final  . Albumin 05/19/2018 4.3  3.5 - 5.0 g/dL Final  . AST 05/19/2018 21  15 - 41 U/L Final  . ALT 05/19/2018 15  0 - 44 U/L Final  . Alkaline Phosphatase 05/19/2018 58  38 - 126 U/L Final  . Total Bilirubin 05/19/2018 0.5  0.3 - 1.2 mg/dL Final  . GFR calc non Af Amer 05/19/2018 >60  >60 mL/min Final  . GFR calc Af Amer 05/19/2018 >60  >60 mL/min Final  . Anion gap 05/19/2018 8  5 - 15 Final   Performed at [facility], [address] [place], [place] [address]  . WBC 05/19/2018 5.3  4.0 - 10.5 K/uL Final  . RBC 05/19/2018 4.25  3.87 - 5.11 MIL/uL Final  . Hemoglobin 05/19/2018 13.3  12.0 - 15.0 g/dL Final  . HCT 05/19/2018 39.9  36.0 - 46.0 % Final  . MCV 05/19/2018 93.9  80.0 - 100.0 fL Final  . MCH 05/19/2018 31.3  26.0 - 34.0 pg Final  . MCHC 05/19/2018 33.3  30.0 - 36.0 g/dL Final  . RDW 05/19/2018  12.0  11.5 - 15.5 % Final  . Platelets 05/19/2018 257  150 - 400 K/uL Final  . nRBC 05/19/2018 0.0  0.0 - 0.2 % Final  . Neutrophils Relative % 05/19/2018 50  % Final  . Neutro Abs 05/19/2018 2.7  1.7 - 7.7 K/uL Final  . Lymphocytes Relative 05/19/2018 40  % Final  . Lymphs Abs 05/19/2018 2.1  0.7 - 4.0 K/uL Final  . Monocytes Relative 05/19/2018 7  % Final  . Monocytes Absolute 05/19/2018 0.3  0.1 - 1.0 K/uL Final  . Eosinophils Relative 05/19/2018 2  % Final  . Eosinophils Absolute 05/19/2018 0.1  0.0 - 0.5 K/uL Final  . Basophils Relative 05/19/2018 1  % Final  . Basophils Absolute 05/19/2018 0.1  0.0 - 0.1 K/uL Final  . Immature Granulocytes 05/19/2018 0  % Final  . Abs Immature Granulocytes 05/19/2018 0.02  0.00 - 0.07 K/uL Final   Performed at [facility], [address], [place], [place] [address]    Assessment:  Patricia Crane is a 48 y.o. female stage IIA left breast cancer s/p partial mastectomy and sentinel lymph node biopsy on 08/18/2012.  Pathology revealed a 2.5 cm grade III invasive ductal carcinoma of the breast.  Margins were uninvolved, but close (5 mm).  DCIS was present.  One sentinel lymph node was negative.  Tumor was ER + (60%), PR + (25%) and Her2/neu 2+ (negative by FISH).  Pathologic stage was T2N0M0.    Oncotype DX score was 24 which translated to a 16% risk of distant recurrence at 10 years with tamoxifen alone (confidence interval 12-19%).  She declined systemic chemotherapy.  BCI testing on 06/11/2017 revealed a 12.7% risk of late recurrence (years 5-10) and a high likelihood of benefit.  She received radiation.  She was  started on [facility] agonist) and Aromasin in 12/2012.  She had a reaction (inflammation at the injection site, swelling and fever). She was switched to Lupron (few joint aches and headache).  Last Lupron injection was in 08/2014.  She was on Aromasin. She began Femara on 07/02/2017 secondary to costs.  She has an abnormal  right mammogram on 05/2013.  Biopsy was negative.  Breast MRI on 09/24/2014 revealed expected post-operative changes in the left breast and no evidence of malignancy.  Bilateral mammogram on 05/21/2017 revealed no evidence of malignancy.  She states that annual breast MRI was recommended per radiology.  Bilateral breast MRI on 12/02/2017 revealed no abnormal enhancement in either breast.  CA 27.29 was 28.2 on 11/26/2013, 23.2 on 12/01/2014,  27.6 on 10/06/2015, 24.5 on 04/05/2016, 23.3 on 10/04/2016, 21.6 on 04/23/2017, 24 on 10/24/2017, and 21.8 on 05/19/2018.  She has been in menopause.  Last menstrual period was 11/2012.  Estradiol was < 5 and FSH 8.2 on 01/12/2015.  Estradiol was 7.6 and FSH 7.6 on 10/06/2015.  Estradiol was 9.0 and FSH 23.9 on 04/05/2016.  Estradiol was 10.6 and FSH 26.2 on 10/04/2016.  Estradiol was < 5 and FSH 27.2 on 04/23/2017.  Bone density on 05/02/2015 revealed osteoporosis with a T-score of -2.8 in the AP spine.  Bone density on 05/21/2017 revealed osteoporosis with a T-score of -2.8 in AP spine L1-L4.  She is on Fosamax, calcium, and vitamin D.  Symptomatically, she is doing well.  She denies any concerns.  Exam is unremarkable.  Plan: 1.  Labs today:  CBC with diff, CMP, CA27.29. 2.  Stage II left breast cancer  Clinically she is doing well.  Exam reveals no evidence of recurrent disease.  CA27.29 is normal.  She began endocrine therapy in 12/2102.  BCI testing revealed a high risk of recurrence.    Discuss plan to continue endocrine therapy for 10 years.  Continue Femara.  Schedule bilateral mammogram on 05/22/2018. 3.  Osteoporosis  Bone density on 2017 and 2019 - stable.  Review consideration of switch from Fosamax to Prolia.  Information on Prolia provided.  Continue calcium and vitamin D. 4.   RTC in 6 months for MD assessment and labs (CBC with diff, CMP, CA27.29), and review of mammogram.   I discussed the assessment and treatment plan with the  patient.  The patient was provided an opportunity to ask questions and all were answered.  The patient agreed with the plan and demonstrated an understanding of the instructions.  The patient was advised to call back or seek an in person evaluation if the symptoms worsen or if the condition fails to improve as anticipated.    Lequita Asal, MD   05/19/2018, 9:31 AM

## 2018-05-19 ENCOUNTER — Inpatient Hospital Stay (HOSPITAL_BASED_OUTPATIENT_CLINIC_OR_DEPARTMENT_OTHER): Payer: 59 | Admitting: Hematology and Oncology

## 2018-05-19 ENCOUNTER — Encounter: Payer: Self-pay | Admitting: Hematology and Oncology

## 2018-05-19 ENCOUNTER — Other Ambulatory Visit: Payer: Self-pay

## 2018-05-19 ENCOUNTER — Inpatient Hospital Stay: Payer: 59 | Attending: Hematology and Oncology

## 2018-05-19 ENCOUNTER — Encounter: Payer: 59 | Admitting: Physical Therapy

## 2018-05-19 ENCOUNTER — Ambulatory Visit: Payer: 59 | Admitting: Gastroenterology

## 2018-05-19 VITALS — BP 112/72 | HR 66 | Temp 98.1°F | Resp 18 | Ht 60.0 in | Wt 126.3 lb

## 2018-05-19 DIAGNOSIS — M818 Other osteoporosis without current pathological fracture: Secondary | ICD-10-CM

## 2018-05-19 DIAGNOSIS — Z170 Estrogen receptor positive status [ER+]: Secondary | ICD-10-CM | POA: Insufficient documentation

## 2018-05-19 DIAGNOSIS — C50912 Malignant neoplasm of unspecified site of left female breast: Secondary | ICD-10-CM

## 2018-05-19 DIAGNOSIS — M810 Age-related osteoporosis without current pathological fracture: Secondary | ICD-10-CM | POA: Insufficient documentation

## 2018-05-19 DIAGNOSIS — Z87891 Personal history of nicotine dependence: Secondary | ICD-10-CM

## 2018-05-19 LAB — COMPREHENSIVE METABOLIC PANEL
ALT: 15 U/L (ref 0–44)
AST: 21 U/L (ref 15–41)
Albumin: 4.3 g/dL (ref 3.5–5.0)
Alkaline Phosphatase: 58 U/L (ref 38–126)
Anion gap: 8 (ref 5–15)
BUN: 14 mg/dL (ref 6–20)
CO2: 27 mmol/L (ref 22–32)
Calcium: 9.2 mg/dL (ref 8.9–10.3)
Chloride: 105 mmol/L (ref 98–111)
Creatinine, Ser: 0.67 mg/dL (ref 0.44–1.00)
GFR calc Af Amer: >60 mL/min (ref >60)
GFR calc non Af Amer: >60 mL/min (ref >60)
Glucose, Bld: 105 mg/dL — ABNORMAL HIGH (ref 70–99)
Potassium: 3.9 mmol/L (ref 3.5–5.1)
Sodium: 140 mmol/L (ref 135–145)
Total Bilirubin: 0.5 mg/dL (ref 0.3–1.2)
Total Protein: 7.5 g/dL (ref 6.5–8.1)

## 2018-05-19 LAB — CBC WITH DIFFERENTIAL/PLATELET
Abs Immature Granulocytes: 0.02 10*3/uL (ref 0.00–0.07)
Basophils Absolute: 0.1 10*3/uL (ref 0.0–0.1)
Basophils Relative: 1 %
Eosinophils Absolute: 0.1 10*3/uL (ref 0.0–0.5)
Eosinophils Relative: 2 %
HCT: 39.9 % (ref 36.0–46.0)
Hemoglobin: 13.3 g/dL (ref 12.0–15.0)
Immature Granulocytes: 0 %
Lymphocytes Relative: 40 %
Lymphs Abs: 2.1 10*3/uL (ref 0.7–4.0)
MCH: 31.3 pg (ref 26.0–34.0)
MCHC: 33.3 g/dL (ref 30.0–36.0)
MCV: 93.9 fL (ref 80.0–100.0)
Monocytes Absolute: 0.3 10*3/uL (ref 0.1–1.0)
Monocytes Relative: 7 %
Neutro Abs: 2.7 10*3/uL (ref 1.7–7.7)
Neutrophils Relative %: 50 %
Platelets: 257 10*3/uL (ref 150–400)
RBC: 4.25 MIL/uL (ref 3.87–5.11)
RDW: 12 % (ref 11.5–15.5)
WBC: 5.3 10*3/uL (ref 4.0–10.5)
nRBC: 0 % (ref 0.0–0.2)

## 2018-05-19 NOTE — Patient Instructions (Signed)

## 2018-05-20 ENCOUNTER — Encounter: Payer: 59 | Admitting: Physical Therapy

## 2018-05-20 LAB — CANCER ANTIGEN 27.29: CA 27.29: 21.8 U/mL (ref 0.0–38.6)

## 2018-05-27 ENCOUNTER — Encounter: Payer: 59 | Admitting: Physical Therapy

## 2018-06-03 ENCOUNTER — Encounter: Payer: 59 | Admitting: Physical Therapy

## 2018-06-10 ENCOUNTER — Encounter: Payer: 59 | Admitting: Physical Therapy

## 2018-06-17 ENCOUNTER — Encounter: Payer: 59 | Admitting: Physical Therapy

## 2018-06-24 ENCOUNTER — Encounter: Payer: 59 | Admitting: Physical Therapy

## 2018-06-25 ENCOUNTER — Other Ambulatory Visit: Payer: Self-pay | Admitting: Internal Medicine

## 2018-06-25 ENCOUNTER — Other Ambulatory Visit: Payer: Self-pay

## 2018-06-25 ENCOUNTER — Ambulatory Visit
Admission: RE | Admit: 2018-06-25 | Discharge: 2018-06-25 | Disposition: A | Discharge status: Home / Self Care | Payer: 59 | Source: Ambulatory Visit | Attending: Internal Medicine | Admitting: Internal Medicine

## 2018-06-25 ENCOUNTER — Ambulatory Visit
Admission: RE | Admit: 2018-06-25 | Discharge: 2018-06-25 | Disposition: A | Discharge status: Home / Self Care | Payer: 59 | Source: Ambulatory Visit | Attending: Hematology and Oncology | Admitting: Hematology and Oncology

## 2018-06-25 DIAGNOSIS — Z1231 Encounter for screening mammogram for malignant neoplasm of breast: Secondary | ICD-10-CM | POA: Diagnosis not present

## 2018-06-25 DIAGNOSIS — C50912 Malignant neoplasm of unspecified site of left female breast: Secondary | ICD-10-CM | POA: Insufficient documentation

## 2018-07-01 ENCOUNTER — Encounter: Payer: 59 | Admitting: Physical Therapy

## 2018-07-08 ENCOUNTER — Encounter: Payer: 59 | Admitting: Physical Therapy

## 2018-07-15 ENCOUNTER — Encounter: Payer: 59 | Admitting: Physical Therapy

## 2018-07-22 ENCOUNTER — Encounter: Payer: 59 | Admitting: Physical Therapy

## 2018-07-27 ENCOUNTER — Other Ambulatory Visit: Payer: Self-pay | Admitting: Internal Medicine

## 2018-07-27 ENCOUNTER — Encounter (INDEPENDENT_AMBULATORY_CARE_PROVIDER_SITE_OTHER): Payer: Self-pay

## 2018-07-27 ENCOUNTER — Telehealth: Payer: Self-pay | Admitting: Internal Medicine

## 2018-07-27 DIAGNOSIS — Z20822 Contact with and (suspected) exposure to covid-19: Secondary | ICD-10-CM

## 2018-07-27 DIAGNOSIS — Z20828 Contact with and (suspected) exposure to other viral communicable diseases: Secondary | ICD-10-CM

## 2018-07-27 NOTE — Telephone Encounter (Signed)
Patient's husband, Adeola Dennen,  Tested positive for CURRENT COVID  19 INFECTION after having a distant contact at work.   He is aware;  Both patient and husband have had a mild URI with sore throat  And she needs to be tested on Monday . Please call.

## 2018-07-28 ENCOUNTER — Telehealth: Payer: Self-pay

## 2018-07-28 ENCOUNTER — Encounter (INDEPENDENT_AMBULATORY_CARE_PROVIDER_SITE_OTHER): Payer: Self-pay

## 2018-07-28 ENCOUNTER — Other Ambulatory Visit: Payer: Self-pay

## 2018-07-28 ENCOUNTER — Other Ambulatory Visit: Payer: 59

## 2018-07-28 DIAGNOSIS — Z20822 Contact with and (suspected) exposure to covid-19: Secondary | ICD-10-CM

## 2018-07-28 NOTE — Progress Notes (Signed)
no

## 2018-07-28 NOTE — Telephone Encounter (Signed)
Pt is scheduled for today at 11:15am.

## 2018-07-28 NOTE — Telephone Encounter (Signed)
This encounter was created in error - please disregard.

## 2018-07-28 NOTE — Telephone Encounter (Signed)
Dr. Derrel Nip request COVID 19 test. Husband tested positive.

## 2018-07-29 ENCOUNTER — Encounter (INDEPENDENT_AMBULATORY_CARE_PROVIDER_SITE_OTHER): Payer: Self-pay

## 2018-07-30 ENCOUNTER — Encounter (INDEPENDENT_AMBULATORY_CARE_PROVIDER_SITE_OTHER): Payer: Self-pay

## 2018-07-30 LAB — NOVEL CORONAVIRUS, NAA: SARS-CoV-2, NAA: NOT DETECTED

## 2018-07-31 ENCOUNTER — Encounter (INDEPENDENT_AMBULATORY_CARE_PROVIDER_SITE_OTHER): Payer: Self-pay

## 2018-08-04 ENCOUNTER — Encounter (INDEPENDENT_AMBULATORY_CARE_PROVIDER_SITE_OTHER): Payer: Self-pay

## 2018-08-05 ENCOUNTER — Encounter (INDEPENDENT_AMBULATORY_CARE_PROVIDER_SITE_OTHER): Payer: Self-pay

## 2018-08-06 ENCOUNTER — Telehealth: Payer: Self-pay

## 2018-08-06 ENCOUNTER — Other Ambulatory Visit: Payer: 59

## 2018-08-06 ENCOUNTER — Ambulatory Visit (INDEPENDENT_AMBULATORY_CARE_PROVIDER_SITE_OTHER): Payer: 59 | Admitting: Internal Medicine

## 2018-08-06 ENCOUNTER — Encounter: Payer: Self-pay | Admitting: Internal Medicine

## 2018-08-06 ENCOUNTER — Other Ambulatory Visit: Payer: Self-pay | Admitting: *Deleted

## 2018-08-06 ENCOUNTER — Ambulatory Visit: Payer: Self-pay

## 2018-08-06 ENCOUNTER — Other Ambulatory Visit: Payer: Self-pay

## 2018-08-06 DIAGNOSIS — Z20822 Contact with and (suspected) exposure to covid-19: Secondary | ICD-10-CM

## 2018-08-06 DIAGNOSIS — Z20828 Contact with and (suspected) exposure to other viral communicable diseases: Secondary | ICD-10-CM | POA: Diagnosis not present

## 2018-08-06 DIAGNOSIS — R21 Rash and other nonspecific skin eruption: Secondary | ICD-10-CM | POA: Insufficient documentation

## 2018-08-06 HISTORY — DX: Contact with and (suspected) exposure to covid-19: Z20.822

## 2018-08-06 NOTE — Progress Notes (Signed)
Virtual Visit via [email or web address]  This visit type was conducted due to national recommendations for restrictions regarding the COVID-19 pandemic (e.g. social distancing).  This format is felt to be most appropriate for this patient at this time.  All issues noted in this document were discussed and addressed.  No physical exam was performed (except for noted visual exam findings with Video Visits).   I connected with@ on 08/06/18 at  9:15 AM EDT by a video enabled telemedicine application or telephone and verified that I am speaking with the correct person using two identifiers. Location patient: home Location provider: work or home office Persons participating in the virtual visit: patient, provider  I discussed the limitations, risks, security and privacy concerns of performing an evaluation and management service by telephone and the availability of in person appointments. I also discussed with the patient that there may be a patient responsible charge related to this service. The patient expressed understanding and agreed to proceed.  Reason for visit: new onset rash following COVID 19 EXPOSURE   HPI:  Husband diagnosed with COVID  And  She tested negative on June  8 .  They have been keeping apart except for meals.  She developed a rash 6 days ago that started as singular red papules and spread from left shoulder to right shoulder,  Followed by spread to both arms and abdomen .  The rash is pruritic .  Headache developed today    ROS: See pertinent positives and negatives per HPI.  Past Medical History:  Diagnosis Date  . Breast cancer ([place]) 2014    breast invasive mammary carcinoma  . Chicken pox   . Constipation 05/19/2013  . Heart murmur   . Personal history of radiation therapy 2014   left breast ca  . Shingles   . Skin cancer   . Thyroid disease     Past Surgical History:  Procedure Laterality Date  . BREAST BIOPSY Left 06/2012   invasive mammary  . BREAST BIOPSY Right  04/22/2013   neg- core  . BREAST LUMPECTOMY Left 08/18/2012   invasive mammary carcinoma. Margins close < 0.82mm but negative, LN negative  . COLONOSCOPY WITH PROPOFOL N/A 12/11/2017   Procedure: COLONOSCOPY WITH PROPOFOL;  Surgeon: Jonathon Bellows, MD;  Location: [facility] ENDOSCOPY;  Service: Gastroenterology;  Laterality: N/A;  . HEMORRHOID BANDING    . lasix Bilateral     Family History  Problem Relation Age of Onset  . Hyperlipidemia Mother   . Heart disease Mother   . Dementia Mother 64       alzheimers  . Cancer Father        tonsil ca  . Hyperlipidemia Sister   . Hyperlipidemia Brother   . Heart disease Maternal Aunt   . Hyperlipidemia Maternal Aunt   . Hypertension Maternal Aunt   . Heart disease Maternal Uncle   . Hyperlipidemia Maternal Uncle   . Stroke Maternal Uncle   . Hypertension Maternal Uncle   . Cancer Paternal Aunt 65       ovarian ca  . Drug abuse Paternal Aunt   . Cancer Paternal Uncle        lung CA ,  smoker  . Breast cancer Neg Hx     SOCIAL HX:  reports that she quit smoking about 15 years ago. Her smoking use included cigarettes. She smoked 2.00 packs per day. She has never used smokeless tobacco. She reports current alcohol use. She reports that she does not use drugs.  Current Outpatient Medications:  .  alendronate (FOSAMAX) 70 MG tablet, TAKE 1 TABLET BY MOUTH ONCE A WEEK ON AN EMPTY STOMACH WITH FULL GLASS OF WATER, Disp: 12 tablet, Rfl: 9 .  ALPRAZolam (XANAX) 0.25 MG tablet, Take 1 tablet (0.25 mg total) by mouth 2 (two) times daily as needed for sleep or anxiety., Disp: 60 tablet, Rfl: 1 .  calcium carbonate (OS-CAL) 600 MG TABS tablet, Take 600 mg by mouth 2 (two) times daily with a meal., Disp: , Rfl:  .  Cholecalciferol (D3 SUPER STRENGTH) 2000 UNITS CAPS, Take by mouth daily., Disp: , Rfl:  .  letrozole (FEMARA) 2.5 MG tablet, Take 1 tablet (2.5 mg total) by mouth daily., Disp: 90 tablet, Rfl: 3 .  Melatonin-Pyridoxine (MELATIN) 3-1 MG TABS,  Take 3 mg by mouth at bedtime., Disp: , Rfl:  .  meloxicam (MOBIC) 15 MG tablet, Take 1 tablet (15 mg total) by mouth daily., Disp: 90 tablet, Rfl: 1 .  Misc Natural Products (OSTEO BI-FLEX ADV DOUBLE ST PO), Take by mouth., Disp: , Rfl:  .  Multiple Vitamins-Minerals (MULTIVITAMIN WITH MINERALS) tablet, Take 1 tablet by mouth daily., Disp: , Rfl:  .  Probiotic Product (ALIGN) 4 MG CAPS, Take 4 mg by mouth daily., Disp: , Rfl:  .  triamcinolone cream (KENALOG) 0.1 %, Apply 1 application topically 2 (two) times daily., Disp: 30 g, Rfl: 2 .  valACYclovir (VALTREX) 500 MG tablet, Take 1 tablet (500 mg total) by mouth daily., Disp: 90 tablet, Rfl: 1  EXAM:  VITALS per patient if applicable:  GENERAL: alert, oriented, appears well and in no acute distress  HEENT: atraumatic, conjunttiva clear, no obvious abnormalities on inspection of external nose and ears  NECK: normal movements of the head and neck  LUNGS: on inspection no signs of respiratory distress, breathing rate appears normal, no obvious gross SOB, gasping or wheezing  CV: no obvious cyanosis  MS: moves all visible extremities without noticeable abnormality  Skin:  Diffuse mild erythematous rash covering arms and torso.   PSYCH/NEURO: pleasant and cooperative, no obvious depression or anxiety, speech and thought processing grossly intact  ASSESSMENT AND PLAN:  Discussed the following assessment and plan:  Exposure to Covid-19 Virus REPEAT SCREENING  for COVID 19  Given exposure to husband and new onset rash with headache ws NEGATIVE  Rash and nonspecific skin eruption She has no history of tick bites or of fever . Suspect viral exanthem,  But she was COVID NEGATIVE     I discussed the assessment and treatment plan with the patient. The patient was provided an opportunity to ask questions and all were answered. The patient agreed with the plan and demonstrated an understanding of the instructions.   The patient was  advised to call back or seek an in-person evaluation if the symptoms worsen or if the condition fails to improve as anticipated.  I provided 15 MInutes of non-face-to-face time during this encounter.   [name], MD

## 2018-08-06 NOTE — Telephone Encounter (Signed)
Dr. Derrel Nip  requested Pt. Be tested for Covid.  In coming call from Patient.  Schedule appoint. For 1:30 today

## 2018-08-06 NOTE — Telephone Encounter (Signed)
Incoming call from  Patient to schedule for Covid-19 testing.  Per Dr.  Derrel Nip.  Patient schedule for  Later testing @ 1:30pm. Patient voices understanding.

## 2018-08-07 ENCOUNTER — Telehealth: Payer: Self-pay | Admitting: Internal Medicine

## 2018-08-07 ENCOUNTER — Encounter (INDEPENDENT_AMBULATORY_CARE_PROVIDER_SITE_OTHER): Payer: Self-pay

## 2018-08-07 LAB — NOVEL CORONAVIRUS, NAA: SARS-CoV-2, NAA: NOT DETECTED

## 2018-08-07 NOTE — Telephone Encounter (Signed)
COVID TEST NEGATIVE  MyChart message sent

## 2018-08-07 NOTE — Assessment & Plan Note (Addendum)
REPEAT SCREENING  for COVID 19  Given exposure to husband and new onset rash with headache ws NEGATIVE

## 2018-08-07 NOTE — Assessment & Plan Note (Addendum)
She has no history of tick bites or of fever . Suspect viral exanthem,  But she was COVID NEGATIVE

## 2018-08-09 ENCOUNTER — Encounter (INDEPENDENT_AMBULATORY_CARE_PROVIDER_SITE_OTHER): Payer: Self-pay

## 2018-09-04 DIAGNOSIS — Z20822 Contact with and (suspected) exposure to covid-19: Secondary | ICD-10-CM

## 2018-09-04 DIAGNOSIS — Z20828 Contact with and (suspected) exposure to other viral communicable diseases: Secondary | ICD-10-CM

## 2018-09-05 NOTE — Addendum Note (Signed)
Addended by: Adair Laundry on: 09/05/2018 04:21 PM   Modules accepted: Orders

## 2018-09-08 ENCOUNTER — Telehealth: Payer: Self-pay | Admitting: Internal Medicine

## 2018-09-08 NOTE — Telephone Encounter (Signed)
Relation to pt: self  Call back number: [phone]    Reason for call:  Patient requesting anti body orders please fax to [facility], [place], [place] [address] [phone] patient has a scheduled appointment today and would like a follow up call confirming orders were sent, patient also requesting a hard copy of orders, please advise

## 2018-09-08 NOTE — Telephone Encounter (Signed)
Spoke with pt to let her know that as soon as the order was signde by the doctor I would fax it over to [facility] gave a verbal understanding.

## 2018-09-09 LAB — SAR COV2 SEROLOGY (COVID19)AB(IGG),IA: SARS-CoV-2 Ab, IgG: NEGATIVE

## 2018-10-04 ENCOUNTER — Other Ambulatory Visit: Payer: Self-pay | Admitting: Hematology and Oncology

## 2018-10-04 DIAGNOSIS — C50919 Malignant neoplasm of unspecified site of unspecified female breast: Secondary | ICD-10-CM

## 2018-11-13 NOTE — Progress Notes (Signed)
[facility]  [address] [place], [place] [address] Phone: [phone]  Fax: [phone]   Clinic Day:  11/18/2018  Referring physician: [name], MD  Chief Complaint: Patricia Crane is a 48 y.o. female with stage IIA left breast cancer who is seen for a 6 month assessment.   HPI: The patient was last seen in the medical oncology clinic on 05/19/2018. At that time, she was doing well. She denied any concerns. Exam was unremarkable. Labs were normal. BCI testing revealed a high risk of recurrence.  We discussed plans to continue endocrine therapy for 10 years. Bilateral mammogram was scheduled. She continued Femara 2.5 mg daily.   Bilateral mammogram on 06/25/2018 showed no evidence of malignancy.    Patient reported to her PCP Dr. Derrel Nip that her husband tested positive for COVID-19 on 07/27/2018. The patient and her husband both had mild upper respiratory infections with a sore throat.   She tested negative for COVID-19 on 07/28/2018.   Patient had a virtual visit with her PCP on 08/06/2018. She was presented with a new onset rash following COVID-19 exposure. The rash developed x 6 days prior to the visit. The rash started as a singular red papules and spread from left shoulder to right shoulder, followed by spread to both arms and abdomen. The rash was pruritic. Patient had a headache. Patient was retested for COVID-19; results were negative. She had no tick bites or fever. Dr. Derrel Nip suspected a viral exanthem.   During the interim, the patient felt "pretty good". She notes right hand numbness and tingling off and on. She has occasional hot flashes. She is taking Fosamax 70 mg tablet once a week. She reports examining her breast monthly. Patient would like to be seen in the clinic every 8 months instead of every 6 months.    Past Medical History:  Diagnosis Date  . Breast cancer ([place]) 2014    breast invasive mammary carcinoma  . Chicken pox    . Constipation 05/19/2013  . Heart murmur   . Personal history of radiation therapy 2014   left breast ca  . Shingles   . Skin cancer   . Thyroid disease     Past Surgical History:  Procedure Laterality Date  . BREAST BIOPSY Left 06/2012   invasive mammary  . BREAST BIOPSY Right 04/22/2013   neg- core  . BREAST LUMPECTOMY Left 08/18/2012   invasive mammary carcinoma. Margins close < 0.68m but negative, LN negative  . COLONOSCOPY WITH PROPOFOL N/A 12/11/2017   Procedure: COLONOSCOPY WITH PROPOFOL;  Surgeon: AJonathon Bellows MD;  [facility];  Service: Gastroenterology;  Laterality: N/A;  . HEMORRHOID BANDING    . lasix Bilateral     Family History  Problem Relation Age of Onset  . Hyperlipidemia Mother   . Heart disease Mother   . Dementia Mother 748      alzheimers  . Cancer Father        tonsil ca  . Hyperlipidemia Sister   . Hyperlipidemia Brother   . Heart disease Maternal Aunt   . Hyperlipidemia Maternal Aunt   . Hypertension Maternal Aunt   . Heart disease Maternal Uncle   . Hyperlipidemia Maternal Uncle   . Stroke Maternal Uncle   . Hypertension Maternal Uncle   . Cancer Paternal Aunt 568      ovarian ca  . Drug abuse Paternal Aunt   . Cancer Paternal Uncle  lung CA ,  smoker  . Breast cancer Neg Hx     Social History:  reports that she quit smoking about 15 years ago. Her smoking use included cigarettes. She smoked 2.00 packs per day. She has never used smokeless tobacco. She reports current alcohol use. She reports that she does not use drugs. She is from [place].  She has just returned from a trip to [place], [place], and [place]. She works in the [employer]. The patient is alone today.  Allergies: No Known Allergies  Current Medications: Current Outpatient Medications  Medication Sig Dispense Refill  . alendronate (FOSAMAX) 70 MG tablet TAKE 1 TABLET BY MOUTH ONCE A WEEK ON AN EMPTY STOMACH WITH FULL GLASS OF WATER 12 tablet 9   . ALPRAZolam (XANAX) 0.25 MG tablet Take 1 tablet (0.25 mg total) by mouth 2 (two) times daily as needed for sleep or anxiety. 60 tablet 1  . calcium carbonate (OS-CAL) 600 MG TABS tablet Take 600 mg by mouth 2 (two) times daily with a meal.    . Cholecalciferol (D3 SUPER STRENGTH) 2000 UNITS CAPS Take by mouth daily.    Marland Kitchen letrozole (FEMARA) 2.5 MG tablet Take 1 tablet (2.5 mg total) by mouth daily. 90 tablet 3  . Melatonin-Pyridoxine (MELATIN) 3-1 MG TABS Take 3 mg by mouth as needed.     . meloxicam (MOBIC) 15 MG tablet Take 1 tablet (15 mg total) by mouth daily. (Patient taking differently: Take 15 mg by mouth as needed. ) 90 tablet 1  . Misc Natural Products (OSTEO BI-FLEX ADV DOUBLE ST PO) Take 1 tablet by mouth daily.     . Multiple Vitamins-Minerals (MULTIVITAMIN WITH MINERALS) tablet Take 1 tablet by mouth daily.    . Probiotic Product (ALIGN) 4 MG CAPS Take 4 mg by mouth daily.    Marland Kitchen triamcinolone cream (KENALOG) 0.1 % Apply 1 application topically 2 (two) times daily. 30 g 2  . valACYclovir (VALTREX) 500 MG tablet Take 1 tablet (500 mg total) by mouth daily. (Patient taking differently: Take 500 mg by mouth as needed. ) 90 tablet 1   No current facility-administered medications for this visit.     Review of Systems  Constitutional: Negative for chills, diaphoresis, fever, malaise/fatigue and weight loss (up 2 pounds).       Feels "pretty good".  HENT: Negative for congestion, ear discharge, ear pain, hearing loss, nosebleeds, sinus pain and sore throat.   Eyes: Negative for blurred vision and double vision.  Respiratory: Negative for cough, hemoptysis, sputum production, shortness of breath and wheezing.   Cardiovascular: Negative for chest pain, palpitations and leg swelling.  Gastrointestinal: Negative for abdominal pain, blood in stool, constipation, diarrhea, heartburn, melena, nausea and vomiting.       Colonoscopy 12/11/2017.   Genitourinary: Negative for dysuria, flank pain,  frequency, hematuria and urgency.  Musculoskeletal: Negative for back pain, falls, joint pain, myalgias and neck pain.  Skin: Negative for itching and rash.       Interval viral exanthem.  Neurological: Positive for tingling (right hand) and sensory change (numbness off and on in right hand). Negative for dizziness, weakness and headaches.  Endo/Heme/Allergies: Does not bruise/bleed easily.       Occasional hot flashes.  Psychiatric/Behavioral: Negative for depression and memory loss. The patient is not nervous/anxious and does not have insomnia.   All other systems reviewed and are negative.  Performance status (ECOG): 0  Vitals Blood pressure (!) 110/57, pulse 64, temperature 97.7 F (36.5  C), temperature source Tympanic, resp. rate 18, height '5\' 2"'$  (1.575 m), weight 128 lb 3.2 oz (58.2 kg), last menstrual period 12/17/2012, SpO2 100 %.  Physical Exam  Constitutional: She is oriented to person, place, and time. She appears well-developed and well-nourished. No distress.  HENT:  Head: Normocephalic and atraumatic.  Mouth/Throat: Oropharynx is clear and moist. No oropharyngeal exudate.  Shoulder length black hair.   Eyes: Pupils are equal, round, and reactive to light. Conjunctivae and EOM are normal. No scleral icterus.  Brown eyes.  Neck: Normal range of motion. Neck supple. No JVD present.  Cardiovascular: Normal rate, regular rhythm and normal heart sounds. Exam reveals no gallop.  No murmur heard. Pulmonary/Chest: Effort normal and breath sounds normal. No respiratory distress. She has no wheezes. She has no rales. She exhibits no tenderness. Right breast exhibits no skin change (fibrocystic changes). Left breast exhibits no skin change (fibrocystic changes).  Abdominal: Soft. Bowel sounds are normal. She exhibits no distension and no mass. There is no abdominal tenderness. There is no rebound and no guarding.  Musculoskeletal: Normal range of motion.        General: No tenderness  or edema.  Lymphadenopathy:    She has no cervical adenopathy.    She has no axillary adenopathy.       Right: No supraclavicular adenopathy present.       Left: No supraclavicular adenopathy present.  Neurological: She is alert and oriented to person, place, and time.  Skin: Skin is warm and dry. She is not diaphoretic.  Psychiatric: She has a normal mood and affect. Her behavior is normal. Judgment and thought content normal.  Nursing note and vitals reviewed.   Appointment on 11/18/2018  Component Date Value Ref Range Status  . Sodium 11/18/2018 138  135 - 145 mmol/L Final  . Potassium 11/18/2018 3.8  3.5 - 5.1 mmol/L Final  . Chloride 11/18/2018 103  98 - 111 mmol/L Final  . CO2 11/18/2018 29  22 - 32 mmol/L Final  . Glucose, Bld 11/18/2018 118* 70 - 99 mg/dL Final  . BUN 11/18/2018 15  6 - 20 mg/dL Final  . Creatinine, Ser 11/18/2018 0.75  0.44 - 1.00 mg/dL Final  . Calcium 11/18/2018 9.5  8.9 - 10.3 mg/dL Final  . Total Protein 11/18/2018 7.7  6.5 - 8.1 g/dL Final  . Albumin 11/18/2018 4.4  3.5 - 5.0 g/dL Final  . AST 11/18/2018 22  15 - 41 U/L Final  . ALT 11/18/2018 23  0 - 44 U/L Final  . Alkaline Phosphatase 11/18/2018 67  38 - 126 U/L Final  . Total Bilirubin 11/18/2018 0.3  0.3 - 1.2 mg/dL Final  . GFR calc non Af Amer 11/18/2018 >60  >60 mL/min Final  . GFR calc Af Amer 11/18/2018 >60  >60 mL/min Final  . Anion gap 11/18/2018 6  5 - 15 Final   Performed at [facility], [address] [place], [place] [address]  . WBC 11/18/2018 5.6  4.0 - 10.5 K/uL Final  . RBC 11/18/2018 4.40  3.87 - 5.11 MIL/uL Final  . Hemoglobin 11/18/2018 13.6  12.0 - 15.0 g/dL Final  . HCT 11/18/2018 40.8  36.0 - 46.0 % Final  . MCV 11/18/2018 92.7  80.0 - 100.0 fL Final  . MCH 11/18/2018 30.9  26.0 - 34.0 pg Final  . MCHC 11/18/2018 33.3  30.0 - 36.0 g/dL Final  . RDW 11/18/2018 12.3  11.5 - 15.5 % Final  .  Platelets 11/18/2018 273  150 - 400 K/uL Final  . nRBC 11/18/2018  0.0  0.0 - 0.2 % Final  . Neutrophils Relative % 11/18/2018 51  % Final  . Neutro Abs 11/18/2018 2.9  1.7 - 7.7 K/uL Final  . Lymphocytes Relative 11/18/2018 39  % Final  . Lymphs Abs 11/18/2018 2.2  0.7 - 4.0 K/uL Final  . Monocytes Relative 11/18/2018 7  % Final  . Monocytes Absolute 11/18/2018 0.4  0.1 - 1.0 K/uL Final  . Eosinophils Relative 11/18/2018 2  % Final  . Eosinophils Absolute 11/18/2018 0.1  0.0 - 0.5 K/uL Final  . Basophils Relative 11/18/2018 1  % Final  . Basophils Absolute 11/18/2018 0.1  0.0 - 0.1 K/uL Final  . Immature Granulocytes 11/18/2018 0  % Final  . Abs Immature Granulocytes 11/18/2018 0.01  0.00 - 0.07 K/uL Final   Performed at [facility], [address] [place], [place] [address]    Assessment:  Patricia Crane is a 48 y.o. female with stage IIA left breast cancer s/p partial mastectomy and sentinel lymph node biopsy on 08/18/2012.  Pathology revealed a 2.5 cm grade III invasive ductal carcinoma of the breast.  Margins were uninvolved, but close (5 mm).  DCIS was present.  One sentinel lymph node was negative.  Tumor was ER + (60%), PR + (25%) and Her2/neu 2+ (negative by FISH).  Pathologic stage was T2N0M0.    Oncotype DX score was 24 which translated to a 16% risk of distant recurrence at 10 years with tamoxifen alone (confidence interval 12-19%).  She declined systemic chemotherapy.  BCI testing on 06/11/2017 revealed a 12.7% risk of late recurrence (years 5-10) and a high likelihood of benefit.  She received radiation.  She was started on Trel[facility] agonist) and Aromasin in 12/2012.  She had a reaction (inflammation at the injection site, swelling and fever). She was switched to Lupron (few joint aches and headache).  Last Lupron injection was in 08/2014.  She was on Aromasin. She began Femara on 07/02/2017 secondary to costs.  She has an abnormal right mammogram on 05/2013.  Biopsy was negative.  Breast MRI on 09/24/2014 revealed expected  post-operative changes in the left breast and no evidence of malignancy.  Bilateral mammogram on 05/21/2017 revealed no evidence of malignancy.  She states that annual breast MRI was recommended per radiology.  Bilateral breast MRI on 12/02/2017 revealed no abnormal enhancement in either breast.  Bilateral mammogram on 06/25/2018 showed no evidence of malignancy.    CA 27.29 was 28.2 on 11/26/2013, 23.2 on 12/01/2014,  27.6 on 10/06/2015, 24.5 on 04/05/2016, 23.3 on 10/04/2016, 21.6 on 04/23/2017, 24 on 10/24/2017, 21.8 on 05/19/2018, and 27.7 on 11/18/2018.  She has been in menopause.  Last menstrual period was 11/2012.  Estradiol was < 5 and FSH 8.2 on 01/12/2015.  Estradiol was 7.6 and FSH 7.6 on 10/06/2015.  Estradiol was 9.0 and FSH 23.9 on 04/05/2016.  Estradiol was 10.6 and FSH 26.2 on 10/04/2016.  Estradiol was < 5 and FSH 27.2 on 04/23/2017.  Bone density on 05/02/2015 revealed osteoporosis with a T-score of -2.8 in the AP spine.  Bone density on 05/21/2017 revealed osteoporosis with a T-score of -2.8 in AP spine L1-L4.  She is on Fosamax, calcium, and vitamin D.  Symptomatically, she is doing well.  Exam is stable.  Plan: 1.   Labs today: CBC with diff, CMP, CA 27.29.   2.  Stage II left breast cancer  Clinically, she continues to do well..             Exam reveals no evidence of recurrent disease.    Mammogram on 06/25/2018 reveals no evidence of malignancy.    CA 27.29 is normal.    BCI testing revealed a high risk of recurrence with plan for endocrine therapy x10 years.               Continue Femara. 3.  Osteoporosis             Bone density in 2017 and 2019 was stable.             Re-review consideration of switch from Fosamax to Prolia.              Information provided on Prolia.   Preauth Prolia.             Continue calcium and vitamin D.  Schedule bone density on 05/22/2019. 4.   RTC in 8 months for MD assessment, labs (CBC with diff, CMP, CA27.29) , review of bone  density, and +/- Prolia.  I discussed the assessment and treatment plan with the patient.  The patient was provided an opportunity to ask questions and all were answered.  The patient agreed with the plan and demonstrated an understanding of the instructions.  The patient was advised to call back if the symptoms worsen or if the condition fails to improve as anticipated.   Lequita Asal, MD, PhD    11/18/2018, 10:10 AM  I, Selena Batten, am acting as scribe for [employer]. Mike Gip, MD, PhD.  I, Melissa C. Mike Gip, MD, have reviewed the above documentation for accuracy and completeness, and I agree with the above.

## 2018-11-17 NOTE — Progress Notes (Signed)
Patient here for follow up. Denies any concerns.  

## 2018-11-18 ENCOUNTER — Encounter: Payer: Self-pay | Admitting: Hematology and Oncology

## 2018-11-18 ENCOUNTER — Inpatient Hospital Stay: Payer: 59 | Attending: Hematology and Oncology | Admitting: Hematology and Oncology

## 2018-11-18 ENCOUNTER — Other Ambulatory Visit: Payer: Self-pay

## 2018-11-18 ENCOUNTER — Inpatient Hospital Stay: Payer: 59

## 2018-11-18 VITALS — BP 110/57 | HR 64 | Temp 97.7°F | Resp 18 | Ht 62.0 in | Wt 128.2 lb

## 2018-11-18 DIAGNOSIS — Z87891 Personal history of nicotine dependence: Secondary | ICD-10-CM | POA: Insufficient documentation

## 2018-11-18 DIAGNOSIS — Z170 Estrogen receptor positive status [ER+]: Secondary | ICD-10-CM | POA: Insufficient documentation

## 2018-11-18 DIAGNOSIS — N951 Menopausal and female climacteric states: Secondary | ICD-10-CM | POA: Diagnosis not present

## 2018-11-18 DIAGNOSIS — C50912 Malignant neoplasm of unspecified site of left female breast: Secondary | ICD-10-CM

## 2018-11-18 DIAGNOSIS — M810 Age-related osteoporosis without current pathological fracture: Secondary | ICD-10-CM | POA: Insufficient documentation

## 2018-11-18 DIAGNOSIS — M818 Other osteoporosis without current pathological fracture: Secondary | ICD-10-CM | POA: Diagnosis not present

## 2018-11-18 DIAGNOSIS — R200 Anesthesia of skin: Secondary | ICD-10-CM | POA: Insufficient documentation

## 2018-11-18 LAB — CBC WITH DIFFERENTIAL/PLATELET
Abs Immature Granulocytes: 0.01 10*3/uL (ref 0.00–0.07)
Basophils Absolute: 0.1 10*3/uL (ref 0.0–0.1)
Basophils Relative: 1 %
Eosinophils Absolute: 0.1 10*3/uL (ref 0.0–0.5)
Eosinophils Relative: 2 %
HCT: 40.8 % (ref 36.0–46.0)
Hemoglobin: 13.6 g/dL (ref 12.0–15.0)
Immature Granulocytes: 0 %
Lymphocytes Relative: 39 %
Lymphs Abs: 2.2 10*3/uL (ref 0.7–4.0)
MCH: 30.9 pg (ref 26.0–34.0)
MCHC: 33.3 g/dL (ref 30.0–36.0)
MCV: 92.7 fL (ref 80.0–100.0)
Monocytes Absolute: 0.4 10*3/uL (ref 0.1–1.0)
Monocytes Relative: 7 %
Neutro Abs: 2.9 10*3/uL (ref 1.7–7.7)
Neutrophils Relative %: 51 %
Platelets: 273 10*3/uL (ref 150–400)
RBC: 4.4 MIL/uL (ref 3.87–5.11)
RDW: 12.3 % (ref 11.5–15.5)
WBC: 5.6 10*3/uL (ref 4.0–10.5)
nRBC: 0 % (ref 0.0–0.2)

## 2018-11-18 LAB — COMPREHENSIVE METABOLIC PANEL
ALT: 23 U/L (ref 0–44)
AST: 22 U/L (ref 15–41)
Albumin: 4.4 g/dL (ref 3.5–5.0)
Alkaline Phosphatase: 67 U/L (ref 38–126)
Anion gap: 6 (ref 5–15)
BUN: 15 mg/dL (ref 6–20)
CO2: 29 mmol/L (ref 22–32)
Calcium: 9.5 mg/dL (ref 8.9–10.3)
Chloride: 103 mmol/L (ref 98–111)
Creatinine, Ser: 0.75 mg/dL (ref 0.44–1.00)
GFR calc Af Amer: >60 mL/min (ref >60)
GFR calc non Af Amer: >60 mL/min (ref >60)
Glucose, Bld: 118 mg/dL — ABNORMAL HIGH (ref 70–99)
Potassium: 3.8 mmol/L (ref 3.5–5.1)
Sodium: 138 mmol/L (ref 135–145)
Total Bilirubin: 0.3 mg/dL (ref 0.3–1.2)
Total Protein: 7.7 g/dL (ref 6.5–8.1)

## 2018-11-18 NOTE — Patient Instructions (Signed)
Denosumab injection What is this medicine? DENOSUMAB (den oh sue mab) slows bone breakdown. Prolia is used to treat osteoporosis in women after menopause and in men, and in people who are taking corticosteroids for 6 months or more. Xgeva is used to treat a high calcium level due to cancer and to prevent bone fractures and other bone problems caused by multiple myeloma or cancer bone metastases. Xgeva is also used to treat giant cell tumor of the bone. This medicine may be used for other purposes; ask your health care provider or pharmacist if you have questions. COMMON BRAND NAME(S): Prolia, XGEVA What should I tell my health care provider before I take this medicine? They need to know if you have any of these conditions:  dental disease  having surgery or tooth extraction  infection  kidney disease  low levels of calcium or Vitamin D in the blood  malnutrition  on hemodialysis  skin conditions or sensitivity  thyroid or parathyroid disease  an unusual reaction to denosumab, other medicines, foods, dyes, or preservatives  pregnant or trying to get pregnant  breast-feeding How should I use this medicine? This medicine is for injection under the skin. It is given by a health care professional in a hospital or clinic setting. A special MedGuide will be given to you before each treatment. Be sure to read this information carefully each time. For Prolia, talk to your pediatrician regarding the use of this medicine in children. Special care may be needed. For Xgeva, talk to your pediatrician regarding the use of this medicine in children. While this drug may be prescribed for children as young as 13 years for selected conditions, precautions do apply. Overdosage: If you think you have taken too much of this medicine contact a poison control center or emergency room at once. NOTE: This medicine is only for you. Do not share this medicine with others. What if I miss a dose? It is  important not to miss your dose. Call your doctor or health care professional if you are unable to keep an appointment. What may interact with this medicine? Do not take this medicine with any of the following medications:  other medicines containing denosumab This medicine may also interact with the following medications:  medicines that lower your chance of fighting infection  steroid medicines like prednisone or cortisone This list may not describe all possible interactions. Give your health care provider a list of all the medicines, herbs, non-prescription drugs, or dietary supplements you use. Also tell them if you smoke, drink alcohol, or use illegal drugs. Some items may interact with your medicine. What should I watch for while using this medicine? Visit your doctor or health care professional for regular checks on your progress. Your doctor or health care professional may order blood tests and other tests to see how you are doing. Call your doctor or health care professional for advice if you get a fever, chills or sore throat, or other symptoms of a cold or flu. Do not treat yourself. This drug may decrease your body's ability to fight infection. Try to avoid being around people who are sick. You should make sure you get enough calcium and vitamin D while you are taking this medicine, unless your doctor tells you not to. Discuss the foods you eat and the vitamins you take with your health care professional. See your dentist regularly. Brush and floss your teeth as directed. Before you have any dental work done, tell your dentist you are   receiving this medicine. Do not become pregnant while taking this medicine or for 5 months after stopping it. Talk with your doctor or health care professional about your birth control options while taking this medicine. Women should inform their doctor if they wish to become pregnant or think they might be pregnant. There is a potential for serious side  effects to an unborn child. Talk to your health care professional or pharmacist for more information. What side effects may I notice from receiving this medicine? Side effects that you should report to your doctor or health care professional as soon as possible:  allergic reactions like skin rash, itching or hives, swelling of the face, lips, or tongue  bone pain  breathing problems  dizziness  jaw pain, especially after dental work  redness, blistering, peeling of the skin  signs and symptoms of infection like fever or chills; cough; sore throat; pain or trouble passing urine  signs of low calcium like fast heartbeat, muscle cramps or muscle pain; pain, tingling, numbness in the hands or feet; seizures  unusual bleeding or bruising  unusually weak or tired Side effects that usually do not require medical attention (report to your doctor or health care professional if they continue or are bothersome):  constipation  diarrhea  headache  joint pain  loss of appetite  muscle pain  runny nose  tiredness  upset stomach This list may not describe all possible side effects. Call your doctor for medical advice about side effects. You may report side effects to FDA at 1-800-FDA-1088. Where should I keep my medicine? This medicine is only given in a clinic, doctor's office, or other health care setting and will not be stored at home. NOTE: This sheet is a summary. It may not cover all possible information. If you have questions about this medicine, talk to your doctor, pharmacist, or health care provider.  2020 Elsevier/Gold Standard (2017-06-14 16:10:44)

## 2018-11-19 LAB — CANCER ANTIGEN 27.29: CA 27.29: 27.7 U/mL (ref 0.0–38.6)

## 2019-01-06 IMAGING — MR MR BILATERAL BREAST WITHOUT AND WITH CONTRAST
4 of 10 series · 19 of 48 positions shown · IV contrast (Yes)
Comparison: Previous exam(s).

CLINICAL DATA: History of left breast cancer status post lumpectomy
in 2744. High risk.

LABS:  None obtained at the time of imaging.
EXAM:
BILATERAL BREAST MRI WITH AND WITHOUT CONTRAST
TECHNIQUE: Multiplanar, multisequence MR images of both breasts were obtained
prior to and following the intravenous administration of 6 ml of
Gadavist
Three-dimensional MR images were rendered by post-processing the
original MR data using the DynaCAD thin client. The 3D MR images are
interpreted and the findings are included in the complete MRI report
below.

[Series 4: ax ir · axial · 3.0mm · 0.70mm/px · 1 of 75 slices shown]
[im 1/75]
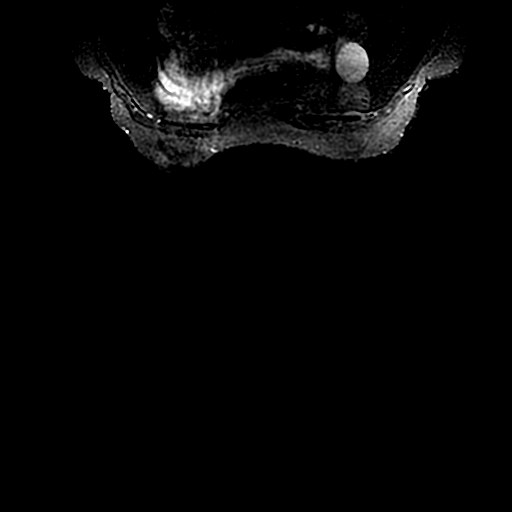

[Series 601: ph1/ vibrant mph · axial · 1.8mm · 0.59mm/px · z∈[-98,+124]mm · 8 of 248 slices shown]
[im 1/248]
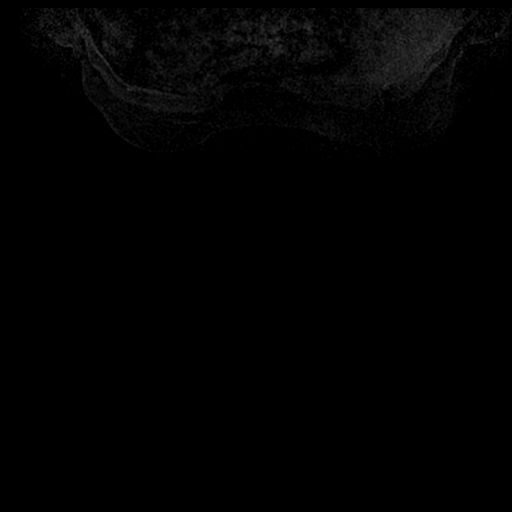
[im 36/248]
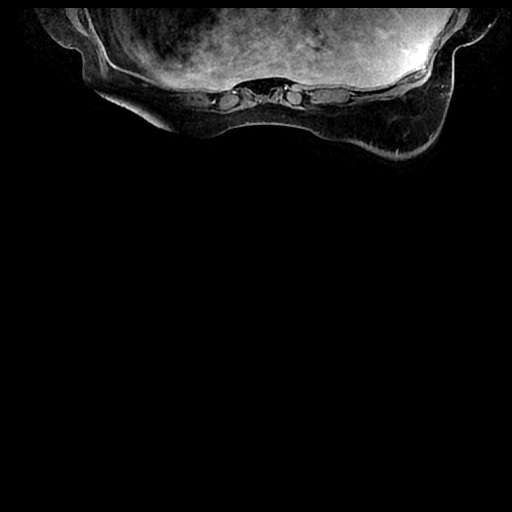
[im 71/248]
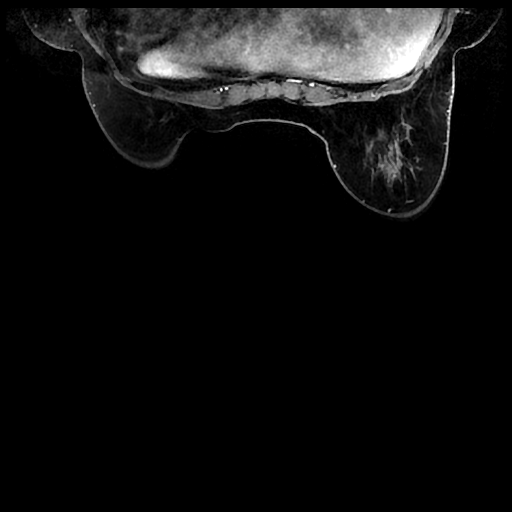
[im 106/248]
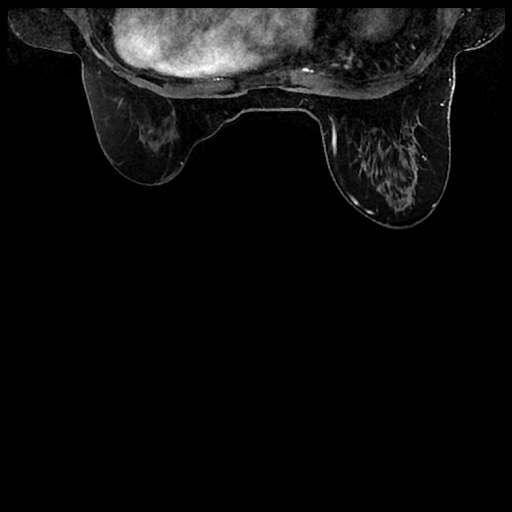
[im 142/248]
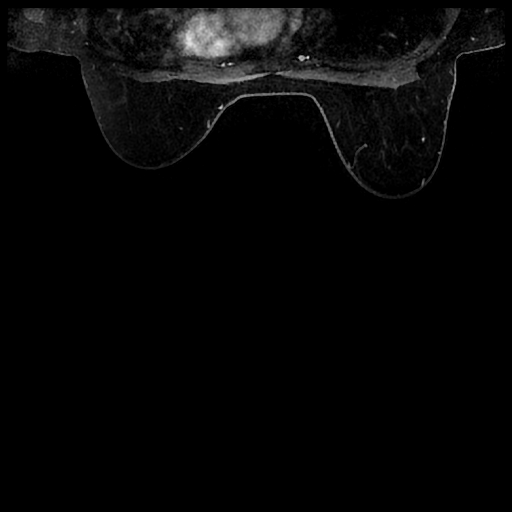
[im 177/248]
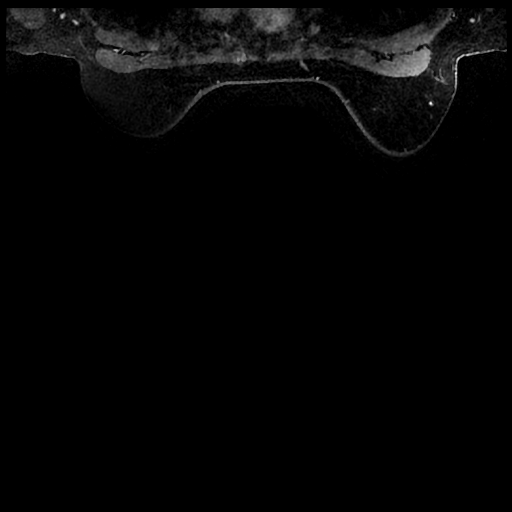
[im 212/248]
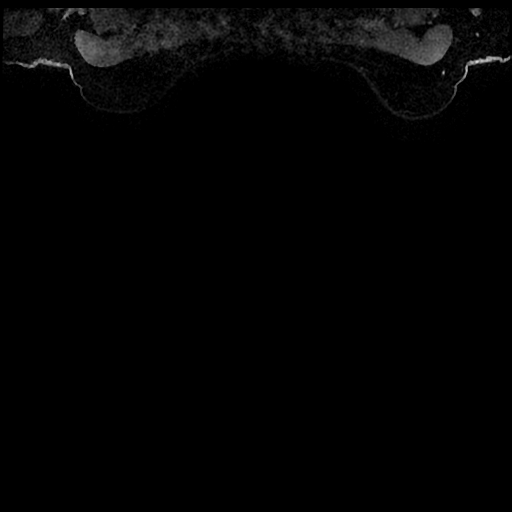
[im 248/248]
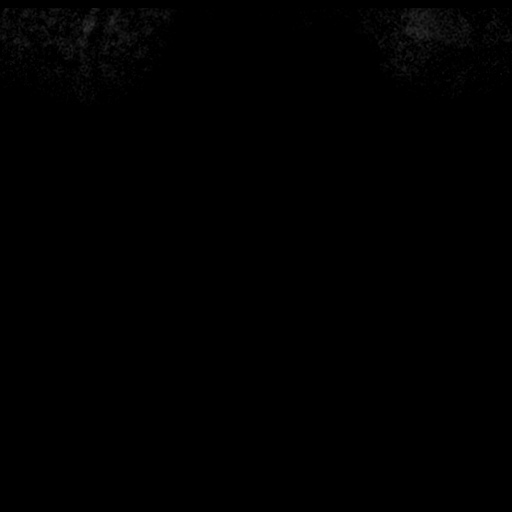

[Series 603: ph3/ vibrant mph · axial · 1.8mm · 0.59mm/px · z∈[-98,+92]mm · 7 of 248 slices shown]
[im 1/248]
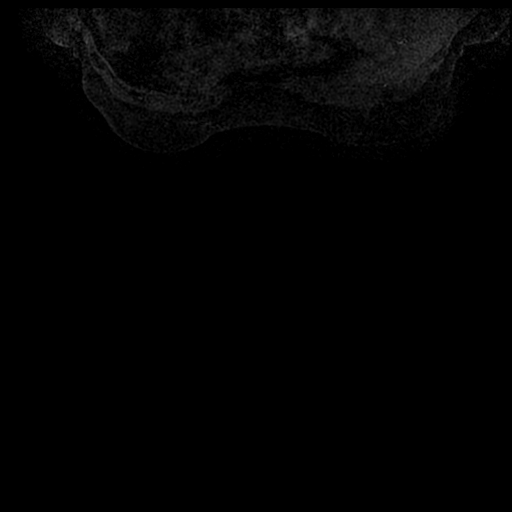
[im 36/248]
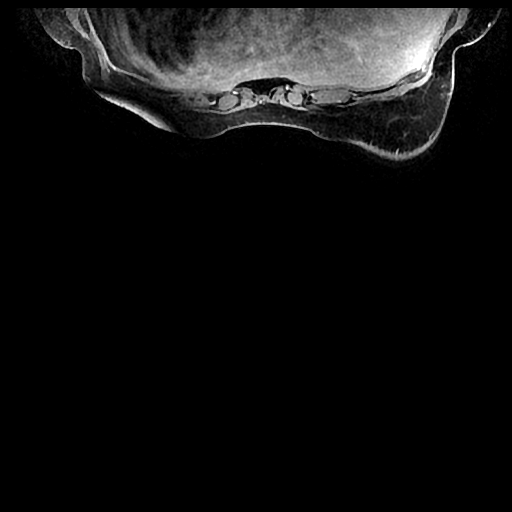
[im 71/248]
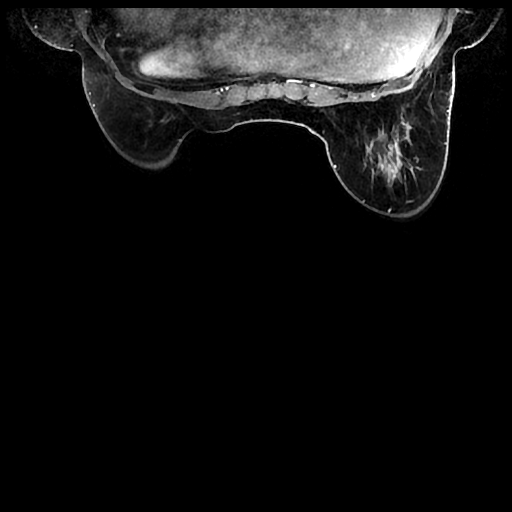
[im 106/248]
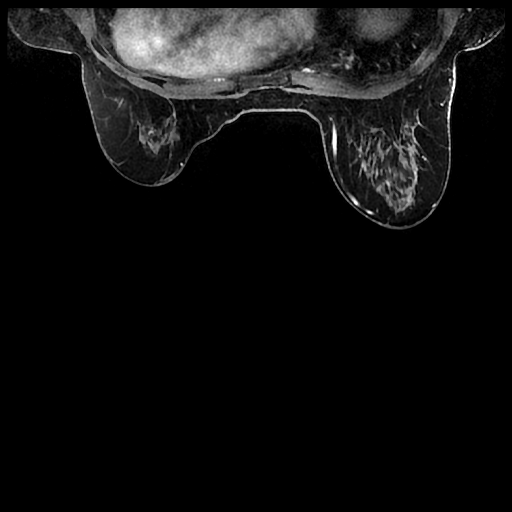
[im 142/248]
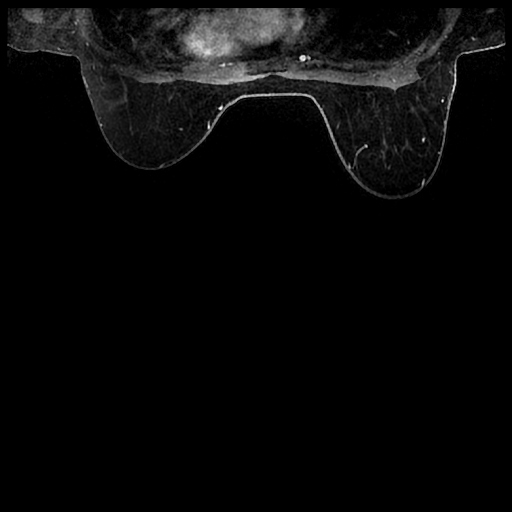
[im 177/248]
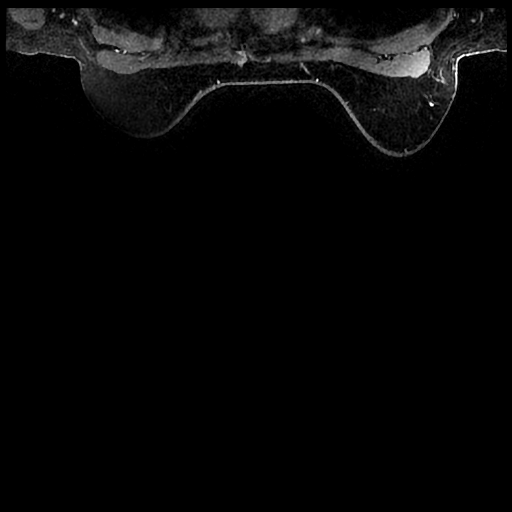
[im 212/248]
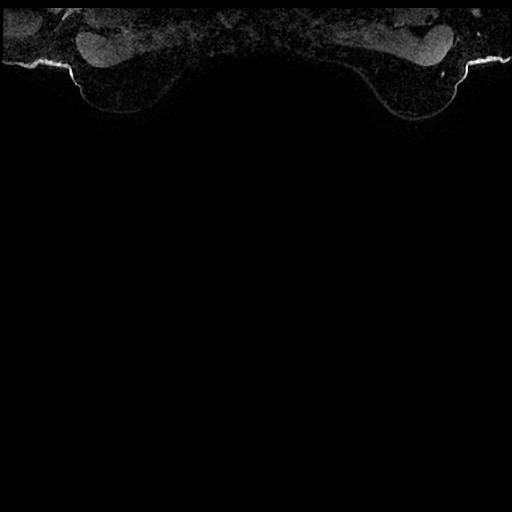

[((id)/601/1)-((id)/600/1) · axial · 1.8mm · 0.59mm/px · z∈[-65,+88]mm · 3 of 226 slices shown]
[im 38/226]
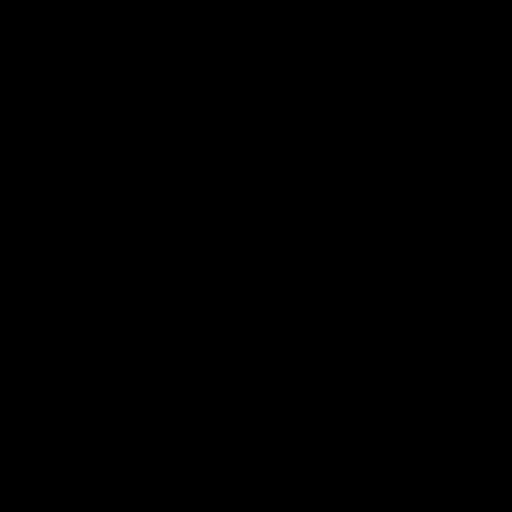
[im 113/226]
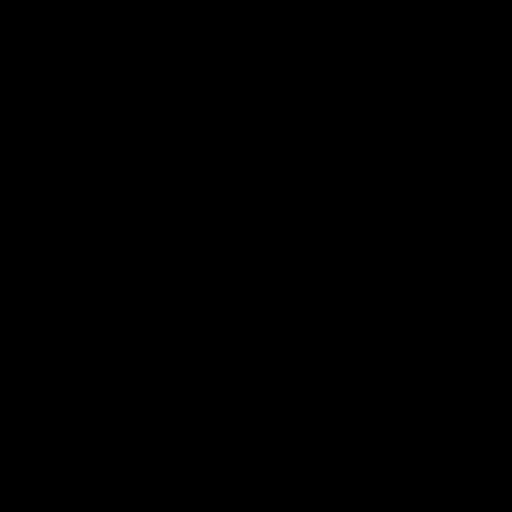
[im 188/226]
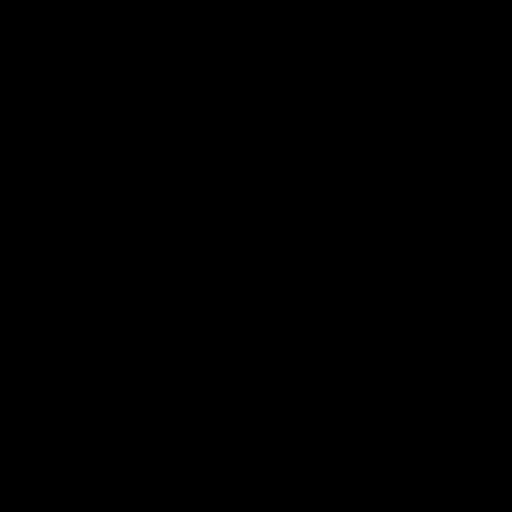

[19 of 48 positions shown; findings below may reference images not displayed]

FINDINGS: Breast composition: c. Heterogeneous fibroglandular tissue.

Background parenchymal enhancement: Moderate.

Right breast: No mass or abnormal enhancement.

Left breast: Lumpectomy changes.  No mass or abnormal enhancement.

Lymph nodes: No abnormal appearing lymph nodes.

Ancillary findings:  None.
IMPRESSION: No abnormal enhancement in either breast.

RECOMMENDATION:
Bilateral diagnostic mammogram in 1 year is recommended.

The American Cancer Society recommends annual MRI and mammography in
patients with an estimated lifetime risk of developing breast cancer
greater than 20 - 25%, or who are known or suspected to be positive
for the breast cancer gene.

BI-RADS CATEGORY  2: Benign.

## 2019-05-16 ENCOUNTER — Other Ambulatory Visit: Payer: Self-pay | Admitting: Internal Medicine

## 2019-06-15 ENCOUNTER — Other Ambulatory Visit: Payer: Self-pay | Admitting: Hematology and Oncology

## 2019-06-19 ENCOUNTER — Other Ambulatory Visit: Payer: Self-pay | Admitting: Internal Medicine

## 2019-06-19 DIAGNOSIS — Z1231 Encounter for screening mammogram for malignant neoplasm of breast: Secondary | ICD-10-CM

## 2019-07-10 ENCOUNTER — Encounter: Payer: Self-pay | Admitting: Hematology and Oncology

## 2019-07-16 ENCOUNTER — Ambulatory Visit
Admission: RE | Admit: 2019-07-16 | Discharge: 2019-07-16 | Disposition: A | Discharge status: Home / Self Care | Payer: No Typology Code available for payment source | Source: Ambulatory Visit | Attending: Hematology and Oncology | Admitting: Hematology and Oncology

## 2019-07-16 ENCOUNTER — Ambulatory Visit
Admission: RE | Admit: 2019-07-16 | Discharge: 2019-07-16 | Disposition: A | Discharge status: Home / Self Care | Payer: No Typology Code available for payment source | Source: Ambulatory Visit | Attending: Internal Medicine | Admitting: Internal Medicine

## 2019-07-16 DIAGNOSIS — M818 Other osteoporosis without current pathological fracture: Secondary | ICD-10-CM | POA: Diagnosis present

## 2019-07-16 DIAGNOSIS — Z1231 Encounter for screening mammogram for malignant neoplasm of breast: Secondary | ICD-10-CM | POA: Diagnosis present

## 2019-07-19 NOTE — Progress Notes (Signed)
[facility]  [address] [place], [place] [address] Phone: [phone]  Fax: [phone]   Clinic Day:  07/21/2019  Referring physician: [name], MD  Chief Complaint: Patricia Crane is a 49 y.o. female with stage IIA left breast cancer who is seen for a 9 month assessment.   HPI: The patient was last seen in the medical oncology clinic on 11/18/2018. At that time, she was doing well.  Exam was stable. Labs were normal. Patient continued on Femara. She continued calcium and vitamin D.  Surveillance continued.  Bilateral mammogram on 07/16/2019 revealed no evidence of malignancy.   Bone density on 07/16/2019 revealed osteoporosis with a T-score of -3.1 in the AP spine L1-L4 (L3).   During the interim, she has done well. Patient has ongoing neuropathy in fingers and hands, occasional constipation, and occasional trouble sleeping for which she takes melatonin. Her hot flashes are improving. She notes an insect bite on the left axillary region.   She remains on Fosamax. I discussed the risk and benefits of hormonal therapy. She reports taking calcium and vitamin D supplementation. Patient agreed to tamoxifen. She has no breast concerns. She is examining her breasts every month.   She had both COVID-19 vaccines. After the 1st does she experienced soreness in her arms, shoulders, and neck. She had no problem with her second dose.    Past Medical History:  Diagnosis Date  . Breast cancer ([place]) 2014    breast invasive mammary carcinoma  . Chicken pox   . Constipation 05/19/2013  . Heart murmur   . Personal history of radiation therapy 2014   left breast ca  . Shingles   . Skin cancer   . Thyroid disease     Past Surgical History:  Procedure Laterality Date  . BREAST BIOPSY Left 06/2012   invasive mammary  . BREAST BIOPSY Right 04/22/2013   neg- core  . BREAST LUMPECTOMY Left 08/18/2012   invasive mammary carcinoma. Margins close <  0.24m but negative, LN negative  . COLONOSCOPY WITH PROPOFOL N/A 12/11/2017   Procedure: COLONOSCOPY WITH PROPOFOL;  Surgeon: AJonathon Bellows MD;  [facility];  Service: Gastroenterology;  Laterality: N/A;  . HEMORRHOID BANDING    . lasix Bilateral     Family History  Problem Relation Age of Onset  . Hyperlipidemia Mother   . Heart disease Mother   . Dementia Mother 750      alzheimers  . Cancer Father        tonsil ca  . Hyperlipidemia Sister   . Hyperlipidemia Brother   . Heart disease Maternal Aunt   . Hyperlipidemia Maternal Aunt   . Hypertension Maternal Aunt   . Heart disease Maternal Uncle   . Hyperlipidemia Maternal Uncle   . Stroke Maternal Uncle   . Hypertension Maternal Uncle   . Cancer Paternal Aunt 552      ovarian ca  . Drug abuse Paternal Aunt   . Cancer Paternal Uncle        lung CA ,  smoker  . Breast cancer Neg Hx     Social History:  reports that she quit smoking about 16 years ago. Her smoking use included cigarettes. She smoked 2.00 packs per day. She has never used smokeless tobacco. She reports current alcohol use. She reports that she does not use drugs. 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 The patient is alone today.  Allergies: No Known Allergies  Current Medications: Current Outpatient Medications  Medication Sig Dispense Refill  . alendronate (FOSAMAX) 70 MG tablet TAKE 1 TABLET BY MOUTH ONCE A WEEK ON AN EMPTY STOMACH WITH FULL GLASS OF WATER 12 tablet 9  . ALPRAZolam (XANAX) 0.25 MG tablet Take 1 tablet (0.25 mg total) by mouth 2 (two) times daily as needed for sleep or anxiety. 60 tablet 1  . calcium carbonate (OS-CAL) 600 MG TABS tablet Take 600 mg by mouth 2 (two) times daily with a meal.    . Cholecalciferol (D3 SUPER STRENGTH) 2000 UNITS CAPS Take by mouth daily.    Marland Kitchen letrozole (FEMARA) 2.5 MG tablet TAKE 1 TABLET BY MOUTH EVERY DAY 90  tablet 2  . Melatonin-Pyridoxine (MELATIN) 3-1 MG TABS Take 3 mg by mouth as needed.     . Misc Natural Products (OSTEO BI-FLEX ADV DOUBLE ST PO) Take 1 tablet by mouth daily.     . Multiple Vitamins-Minerals (MULTIVITAMIN WITH MINERALS) tablet Take 1 tablet by mouth daily.    . Probiotic Product (ALIGN) 4 MG CAPS Take 4 mg by mouth daily.    . valACYclovir (VALTREX) 500 MG tablet TAKE 1 TABLET BY MOUTH EVERY DAY 30 tablet 5  . meloxicam (MOBIC) 15 MG tablet Take 1 tablet (15 mg total) by mouth daily. (Patient not taking: Reported on 07/21/2019) 90 tablet 1  . triamcinolone cream (KENALOG) 0.1 % Apply 1 application topically 2 (two) times daily. (Patient not taking: Reported on 07/21/2019) 30 g 2   No current facility-administered medications for this visit.    Review of Systems  Constitutional: Negative for chills, diaphoresis, fever, malaise/fatigue and weight loss (up 2 pounds).       Doing well.  HENT: Negative for congestion, ear discharge, ear pain, hearing loss, nosebleeds, sinus pain and sore throat.   Eyes: Negative for blurred vision and double vision.  Respiratory: Negative for cough, hemoptysis, sputum production, shortness of breath and wheezing.   Cardiovascular: Negative for chest pain, palpitations and leg swelling.  Gastrointestinal: Positive for constipation (occasional). Negative for abdominal pain, blood in stool, diarrhea, heartburn, melena, nausea and vomiting.       Colonoscopy 12/11/2017.   Genitourinary: Negative for dysuria, flank pain, frequency, hematuria and urgency.  Musculoskeletal: Negative for back pain, falls, joint pain, myalgias and neck pain.  Skin: Negative for itching and rash.       Insect bite on left axillary region.  Neurological: Positive for tingling (right hand) and sensory change (numbness off and on in right hand). Negative for dizziness, weakness and headaches.  Endo/Heme/Allergies: Does not bruise/bleed easily.       Occasional hot flashes,  improving.  Psychiatric/Behavioral: Negative for depression and memory loss. The patient has insomnia (occasional, on melatonin). The patient is not nervous/anxious.   All other systems reviewed and are negative.  Performance status (ECOG): 0  Vitals Blood pressure (!) 110/58, pulse (!) 59, temperature (!) 97 F (36.1 C), temperature source Tympanic, weight 130 lb 6.4 oz (59.1 kg), last menstrual period 12/17/2012, SpO2 99 %.  Physical Exam Vitals and nursing note reviewed.  Constitutional:      General: She is not in acute distress.    Appearance: She is well-developed and well-nourished. She is not diaphoretic.  HENT:     Head: Normocephalic and atraumatic.     Mouth/Throat:     Mouth: Oropharynx is clear and moist.  Pharynx: No oropharyngeal exudate.      Comments: Shoulder length black hair. Mask. Eyes:     General: No scleral icterus.    Extraocular Movements: EOM normal.     Conjunctiva/sclera: Conjunctivae normal.     Pupils: Pupils are equal, round, and reactive to light.     Comments: Brown eyes.  Neck:     Vascular: No JVD.  Cardiovascular:     Rate and Rhythm: Normal rate and regular rhythm.     Heart sounds: Normal heart sounds. No murmur heard.  No gallop.   Pulmonary:     Effort: Pulmonary effort is normal. No respiratory distress.     Breath sounds: Normal breath sounds. No wheezing or rales.     Comments: Left breast medial incision.  Tenderness superior and lateral to incision. Chest:     Chest wall: No tenderness.     Breasts:        Right: No inverted nipple, mass, nipple discharge, skin change or tenderness.        Left: Tenderness present. No inverted nipple, mass, nipple discharge or skin change.  Abdominal:     General: Bowel sounds are normal. There is no distension.     Palpations: Abdomen is soft. There is no mass.     Tenderness: There is no abdominal tenderness. There is no guarding or rebound.  Musculoskeletal:        General: No  tenderness or edema. Normal range of motion.     Cervical back: Normal range of motion and neck supple.  Lymphadenopathy:     Head:     Right side of head: No preauricular, posterior auricular or occipital adenopathy.     Left side of head: No preauricular, posterior auricular or occipital adenopathy.     Cervical: No cervical adenopathy.     Upper Body:  No axillary adenopathy present.    Right upper body: No supraclavicular adenopathy.     Left upper body: No supraclavicular adenopathy.     Lower Body: No right inguinal adenopathy. No left inguinal adenopathy.  Skin:    General: Skin is warm and dry.     Coloration: Skin is not pale.     Findings: No erythema or rash.  Neurological:     Mental Status: She is alert and oriented to person, place, and time.  Psychiatric:        Mood and Affect: Mood and affect normal.        Behavior: Behavior normal.        Thought Content: Thought content normal.        Judgment: Judgment normal.    Appointment on 07/21/2019  Component Date Value Ref Range Status  . WBC 07/21/2019 5.4  4.0 - 10.5 K/uL Final  . RBC 07/21/2019 4.27  3.87 - 5.11 MIL/uL Final  . Hemoglobin 07/21/2019 12.9  12.0 - 15.0 g/dL Final  . HCT 07/21/2019 39.4  36.0 - 46.0 % Final  . MCV 07/21/2019 92.3  80.0 - 100.0 fL Final  . MCH 07/21/2019 30.2  26.0 - 34.0 pg Final  . MCHC 07/21/2019 32.7  30.0 - 36.0 g/dL Final  . RDW 07/21/2019 12.5  11.5 - 15.5 % Final  . Platelets 07/21/2019 257  150 - 400 K/uL Final  . nRBC 07/21/2019 0.0  0.0 - 0.2 % Final  . Neutrophils Relative % 07/21/2019 53  % Final  . Neutro Abs 07/21/2019 2.8  1.7 - 7.7 K/uL Final  . Lymphocytes Relative 07/21/2019  38  % Final  . Lymphs Abs 07/21/2019 2.0  0.7 - 4.0 K/uL Final  . Monocytes Relative 07/21/2019 6  % Final  . Monocytes Absolute 07/21/2019 0.3  0.1 - 1.0 K/uL Final  . Eosinophils Relative 07/21/2019 2  % Final  . Eosinophils Absolute 07/21/2019 0.1  0.0 - 0.5 K/uL Final  . Basophils  Relative 07/21/2019 1  % Final  . Basophils Absolute 07/21/2019 0.0  0.0 - 0.1 K/uL Final  . Immature Granulocytes 07/21/2019 0  % Final  . Abs Immature Granulocytes 07/21/2019 0.02  0.00 - 0.07 K/uL Final   Performed at [facility], [address] [place], [place] [address]    Assessment:  Patricia Crane is a 49 y.o. female with stage IIA left breast cancer s/p partial mastectomy and sentinel lymph node biopsy on 08/18/2012.  Pathology revealed a 2.5 cm grade III invasive ductal carcinoma of the breast.  Margins were uninvolved, but close (5 mm).  DCIS was present.  One sentinel lymph node was negative.  Tumor was ER + (60%), PR + (25%) and Her2/neu 2+ (negative by FISH).  Pathologic stage was T2N0M0.    Oncotype DX score was 24 which translated to a 16% risk of distant recurrence at 10 years with tamoxifen alone (confidence interval 12-19%).  She declined systemic chemotherapy.  BCI testing on 06/11/2017 revealed a 12.7% risk of late recurrence (years 5-10) and a high likelihood of benefit.  She received radiation.  She was started on [facility] agonist) and Aromasin in 12/2012.  She had a reaction (inflammation at the injection site, swelling and fever). She was switched to Lupron (few joint aches and headache).  Last Lupron injection was in 08/2014.  She was on Aromasin. She began Femara on 07/02/2017 secondary to costs.  Bilateral breast MRI on 12/02/2017 revealed no abnormal enhancement in either breast.  Bilateral mammogram on 07/16/2019 revealed no evidence of malignancy.   CA 27.29 was 28.2 on 11/26/2013, 23.2 on 12/01/2014,  27.6 on 10/06/2015, 24.5 on 04/05/2016, 23.3 on 10/04/2016, 21.6 on 04/23/2017, 24 on 10/24/2017, 21.8 on 05/19/2018, 27.7 on 11/18/2018, and 23.9 on 07/21/2019.  She has been in menopause.  Last menstrual period was 11/2012.  Estradiol was < 5 and FSH 8.2 on 01/12/2015.  Estradiol was 7.6 and FSH 7.6 on 10/06/2015.  Estradiol was 9.0 and FSH  23.9 on 04/05/2016.  Estradiol was 10.6 and FSH 26.2 on 10/04/2016.  Estradiol was < 5 and FSH 27.2 on 04/23/2017.  Bone density on 05/02/2015 revealed osteoporosis with a T-score of -2.8 in the AP spine.  Bone density on 05/21/2017 revealed osteoporosis with a T-score of -2.8 in AP spine L1-L4.  Bone density on 07/16/2019 revealed osteoporosis with a T-score of -3.1 in the AP spine L1-L4 (L3).  She is on Fosamax, calcium, and vitamin D.  Symptomatically, she is doing well.  Exam is unremarkable.  Plan: 1.  Labs today: CBC with diff, CMP, CA 27.29. 2.  Stage II left breast cancer  Clinically, she is doing well.             Exam feels no evidence of recurrent disease.  Bilateral mammogram on 07/16/2019 revealed no evidence of malignancy.    CA 27.29 is 23.9 (normal).   BCI testing revealed a high risk of recurrence with plan for endocrine therapy x10 years.               Continue Femara (endocrine therapy began in 12/2012). 3.  Osteoporosis             Bone density in 2017 and 2019 revealed a T score of -2.8.  Bone density study on 07/16/2019 revealed osteoporosis with a T score of -3.1.             Continue calcium and vitamin D.  Discuss consideration of switch from Fosamax to Zometa.   Patient notes cost of Prolia prohibitive ($2179).  Discuss consideration of switch from Femara to tamoxifen.   Tamoxifen is not associated with bone loss. 4.   Patient to call regarding decision about Zometa or possibly switch from Femara to tamoxifen.   5.   RTC in 6 months for MD assessment and labs (CBC with diff, CMP, CA 27.29).  I discussed the assessment and treatment plan with the patient.  The patient was provided an opportunity to ask questions and all were answered.  The patient agreed with the plan and demonstrated an understanding of the instructions.  The patient was advised to call back if the symptoms worsen or if the condition fails to improve as anticipated.  I provided 15 minutes of  face-to-face time during this this encounter and > 50% was spent counseling as documented under my assessment and plan.  An additional 5-6 minutes were spent reviewing her chart (Epic and Care Everywhere) including notes, labs, and imaging studies.  I provided these services from the [facility] office.   Lequita Asal, MD, PhD    07/21/2019, 9:11 AM  I, Selena Batten, am acting as scribe for [employer]. Mike Gip, MD, PhD.  I, Ara Grandmaison C. Mike Gip, MD, have reviewed the above documentation for accuracy and completeness, and I agree with the above.

## 2019-07-21 ENCOUNTER — Ambulatory Visit: Payer: 59

## 2019-07-21 ENCOUNTER — Inpatient Hospital Stay: Payer: No Typology Code available for payment source

## 2019-07-21 ENCOUNTER — Encounter: Payer: Self-pay | Admitting: Hematology and Oncology

## 2019-07-21 ENCOUNTER — Inpatient Hospital Stay
Payer: No Typology Code available for payment source | Attending: Hematology and Oncology | Admitting: Hematology and Oncology

## 2019-07-21 ENCOUNTER — Other Ambulatory Visit: Payer: Self-pay

## 2019-07-21 VITALS — BP 110/58 | HR 59 | Temp 97.0°F | Wt 130.4 lb

## 2019-07-21 DIAGNOSIS — Z79899 Other long term (current) drug therapy: Secondary | ICD-10-CM | POA: Insufficient documentation

## 2019-07-21 DIAGNOSIS — M810 Age-related osteoporosis without current pathological fracture: Secondary | ICD-10-CM | POA: Diagnosis not present

## 2019-07-21 DIAGNOSIS — M542 Cervicalgia: Secondary | ICD-10-CM | POA: Insufficient documentation

## 2019-07-21 DIAGNOSIS — Z923 Personal history of irradiation: Secondary | ICD-10-CM | POA: Diagnosis not present

## 2019-07-21 DIAGNOSIS — Z79811 Long term (current) use of aromatase inhibitors: Secondary | ICD-10-CM | POA: Insufficient documentation

## 2019-07-21 DIAGNOSIS — Z8349 Family history of other endocrine, nutritional and metabolic diseases: Secondary | ICD-10-CM | POA: Insufficient documentation

## 2019-07-21 DIAGNOSIS — G629 Polyneuropathy, unspecified: Secondary | ICD-10-CM | POA: Insufficient documentation

## 2019-07-21 DIAGNOSIS — Z85828 Personal history of other malignant neoplasm of skin: Secondary | ICD-10-CM | POA: Diagnosis not present

## 2019-07-21 DIAGNOSIS — Z170 Estrogen receptor positive status [ER+]: Secondary | ICD-10-CM | POA: Insufficient documentation

## 2019-07-21 DIAGNOSIS — Z814 Family history of other substance abuse and dependence: Secondary | ICD-10-CM | POA: Diagnosis not present

## 2019-07-21 DIAGNOSIS — R232 Flushing: Secondary | ICD-10-CM | POA: Insufficient documentation

## 2019-07-21 DIAGNOSIS — Z818 Family history of other mental and behavioral disorders: Secondary | ICD-10-CM | POA: Insufficient documentation

## 2019-07-21 DIAGNOSIS — R519 Headache, unspecified: Secondary | ICD-10-CM | POA: Diagnosis not present

## 2019-07-21 DIAGNOSIS — M818 Other osteoporosis without current pathological fracture: Secondary | ICD-10-CM

## 2019-07-21 DIAGNOSIS — C50912 Malignant neoplasm of unspecified site of left female breast: Secondary | ICD-10-CM | POA: Insufficient documentation

## 2019-07-21 DIAGNOSIS — Z823 Family history of stroke: Secondary | ICD-10-CM | POA: Diagnosis not present

## 2019-07-21 DIAGNOSIS — Z8041 Family history of malignant neoplasm of ovary: Secondary | ICD-10-CM | POA: Diagnosis not present

## 2019-07-21 DIAGNOSIS — Z87891 Personal history of nicotine dependence: Secondary | ICD-10-CM | POA: Diagnosis not present

## 2019-07-21 DIAGNOSIS — K5900 Constipation, unspecified: Secondary | ICD-10-CM | POA: Insufficient documentation

## 2019-07-21 DIAGNOSIS — Z8719 Personal history of other diseases of the digestive system: Secondary | ICD-10-CM | POA: Diagnosis not present

## 2019-07-21 DIAGNOSIS — Z8249 Family history of ischemic heart disease and other diseases of the circulatory system: Secondary | ICD-10-CM | POA: Insufficient documentation

## 2019-07-21 DIAGNOSIS — Z801 Family history of malignant neoplasm of trachea, bronchus and lung: Secondary | ICD-10-CM | POA: Insufficient documentation

## 2019-07-21 LAB — CBC WITH DIFFERENTIAL/PLATELET
Abs Immature Granulocytes: 0.02 10*3/uL (ref 0.00–0.07)
Basophils Absolute: 0 10*3/uL (ref 0.0–0.1)
Basophils Relative: 1 %
Eosinophils Absolute: 0.1 10*3/uL (ref 0.0–0.5)
Eosinophils Relative: 2 %
HCT: 39.4 % (ref 36.0–46.0)
Hemoglobin: 12.9 g/dL (ref 12.0–15.0)
Immature Granulocytes: 0 %
Lymphocytes Relative: 38 %
Lymphs Abs: 2 10*3/uL (ref 0.7–4.0)
MCH: 30.2 pg (ref 26.0–34.0)
MCHC: 32.7 g/dL (ref 30.0–36.0)
MCV: 92.3 fL (ref 80.0–100.0)
Monocytes Absolute: 0.3 10*3/uL (ref 0.1–1.0)
Monocytes Relative: 6 %
Neutro Abs: 2.8 10*3/uL (ref 1.7–7.7)
Neutrophils Relative %: 53 %
Platelets: 257 10*3/uL (ref 150–400)
RBC: 4.27 MIL/uL (ref 3.87–5.11)
RDW: 12.5 % (ref 11.5–15.5)
WBC: 5.4 10*3/uL (ref 4.0–10.5)
nRBC: 0 % (ref 0.0–0.2)

## 2019-07-21 LAB — COMPREHENSIVE METABOLIC PANEL
ALT: 23 U/L (ref 0–44)
AST: 24 U/L (ref 15–41)
Albumin: 4.2 g/dL (ref 3.5–5.0)
Alkaline Phosphatase: 63 U/L (ref 38–126)
Anion gap: 7 (ref 5–15)
BUN: 15 mg/dL (ref 6–20)
CO2: 26 mmol/L (ref 22–32)
Calcium: 9.1 mg/dL (ref 8.9–10.3)
Chloride: 106 mmol/L (ref 98–111)
Creatinine, Ser: 0.73 mg/dL (ref 0.44–1.00)
GFR calc Af Amer: >60 mL/min (ref >60)
GFR calc non Af Amer: >60 mL/min (ref >60)
Glucose, Bld: 142 mg/dL — ABNORMAL HIGH (ref 70–99)
Potassium: 3.9 mmol/L (ref 3.5–5.1)
Sodium: 139 mmol/L (ref 135–145)
Total Bilirubin: 0.6 mg/dL (ref 0.3–1.2)
Total Protein: 7.3 g/dL (ref 6.5–8.1)

## 2019-07-21 NOTE — Progress Notes (Signed)
Patient has ongoing neuropathy in fingers and hands, occasional constipation, and occasional trouble sleeping for which she takes melatonin.

## 2019-07-21 NOTE — Patient Instructions (Signed)
Tamoxifen oral tablet What is this medicine? TAMOXIFEN (ta MOX i fen) blocks the effects of estrogen. It is commonly used to treat breast cancer. It is also used to decrease the chance of breast cancer coming back in women who have received treatment for the disease. It may also help prevent breast cancer in women who have a high risk of developing breast cancer. This medicine may be used for other purposes; ask your health care provider or pharmacist if you have questions. COMMON BRAND NAME(S): Nolvadex What should I tell my health care provider before I take this medicine? They need to know if you have any of these conditions:  blood clots  blood disease  cataracts or impaired eyesight  endometriosis  high calcium levels  high cholesterol  irregular menstrual cycles  liver disease  stroke  uterine fibroids  an unusual reaction to tamoxifen, other medicines, foods, dyes, or preservatives  pregnant or trying to get pregnant  breast-feeding How should I use this medicine? Take this medicine by mouth with a glass of water. Follow the directions on the prescription label. You can take it with or without food. Take your medicine at regular intervals. Do not take your medicine more often than directed. Do not stop taking except on your doctor's advice. A special MedGuide will be given to you by the pharmacist with each prescription and refill. Be sure to read this information carefully each time. Talk to your pediatrician regarding the use of this medicine in children. While this drug may be prescribed for selected conditions, precautions do apply. Overdosage: If you think you have taken too much of this medicine contact a poison control center or emergency room at once. NOTE: This medicine is only for you. Do not share this medicine with others. What if I miss a dose? If you miss a dose, take it as soon as you can. If it is almost time for your next dose, take only that  dose. Do not take double or extra doses. What may interact with this medicine? Do not take this medicine with any of the following medications:  cisapride  certain medicines for irregular heart beat like dronedarone, quinidine  certain medicines for fungal infection like fluconazole, posaconazole  pimozide  saquinavir  thioridazine This medicine may also interact with the following medications:  aminoglutethimide  anastrozole  bromocriptine  chemotherapy drugs  dofetilide  female hormones, like estrogens and birth control pills  letrozole  medroxyprogesterone  phenobarbital  rifampin  warfarin This list may not describe all possible interactions. Give your health care provider a list of all the medicines, herbs, non-prescription drugs, or dietary supplements you use. Also tell them if you smoke, drink alcohol, or use illegal drugs. Some items may interact with your medicine. What should I watch for while using this medicine? Visit your doctor or health care professional for regular checks on your progress. You will need regular pelvic exams, breast exams, and mammograms. If you are taking this medicine to reduce your risk of getting breast cancer, you should know that this medicine does not prevent all types of breast cancer. If breast cancer or other problems occur, there is no guarantee that it will be found at an early stage. Do not become pregnant while taking this medicine or for 2 months after stopping it. Women should inform their doctor if they wish to become pregnant or think they might be pregnant. There is a potential for serious side effects to an unborn child. Talk  to your health care professional or pharmacist for more information. Do not breast-feed an infant while taking this medicine or for 3 months after stopping it. This medicine may interfere with the ability to have a child. Talk with your doctor or health care professional if you are concerned about  your fertility. What side effects may I notice from receiving this medicine? Side effects that you should report to your doctor or health care professional as soon as possible:  allergic reactions like skin rash, itching or hives, swelling of the face, lips, or tongue  changes in vision  changes in your menstrual cycle  difficulty walking or talking  new breast lumps  numbness  pelvic pain or pressure  redness, blistering, peeling or loosening of the skin, including inside the mouth  signs and symptoms of a dangerous change in heartbeat or heart rhythm like chest pain, dizziness, fast or irregular heartbeat, palpitations, feeling faint or lightheaded, falls, breathing problems  sudden chest pain  swelling, pain or tenderness in your calf or leg  unusual bruising or bleeding  vaginal discharge that is bloody, brown, or rust  weakness  yellowing of the whites of the eyes or skin Side effects that usually do not require medical attention (report to your doctor or health care professional if they continue or are bothersome):  fatigue  hair loss, although uncommon and is usually mild  headache  hot flashes  impotence (in men)  nausea, vomiting (mild)  vaginal discharge (white or clear) This list may not describe all possible side effects. Call your doctor for medical advice about side effects. You may report side effects to FDA at 1-800-FDA-1088. Where should I keep my medicine? Keep out of the reach of children. Store at room temperature between 20 and 25 degrees C (68 and 77 degrees F). Protect from light. Keep container tightly closed. Throw away any unused medicine after the expiration date. NOTE: This sheet is a summary. It may not cover all possible information. If you have questions about this medicine, talk to your doctor, pharmacist, or health care provider.  2020 Elsevier/Gold Standard (2018-01-28 11:15:31)   CPT M81.8  Zoledronic Acid injection  (Hypercalcemia, Oncology) What is this medicine? ZOLEDRONIC ACID (ZOE le dron ik AS id) lowers the amount of calcium loss from bone. It is used to treat too much calcium in your blood from cancer. It is also used to prevent complications of cancer that has spread to the bone. This medicine may be used for other purposes; ask your health care provider or pharmacist if you have questions. COMMON BRAND NAME(S): Zometa What should I tell my health care provider before I take this medicine? They need to know if you have any of these conditions:  aspirin-sensitive asthma  cancer, especially if you are receiving medicines used to treat cancer  dental disease or wear dentures  infection  kidney disease  receiving corticosteroids like dexamethasone or prednisone  an unusual or allergic reaction to zoledronic acid, other medicines, foods, dyes, or preservatives  pregnant or trying to get pregnant  breast-feeding How should I use this medicine? This medicine is for infusion into a vein. It is given by a health care professional in a hospital or clinic setting. Talk to your pediatrician regarding the use of this medicine in children. Special care may be needed. Overdosage: If you think you have taken too much of this medicine contact a poison control center or emergency room at once. NOTE: This medicine is only for you.  Do not share this medicine with others. What if I miss a dose? It is important not to miss your dose. Call your doctor or health care professional if you are unable to keep an appointment. What may interact with this medicine?  certain antibiotics given by injection  NSAIDs, medicines for pain and inflammation, like ibuprofen or naproxen  some diuretics like bumetanide, furosemide  teriparatide  thalidomide This list may not describe all possible interactions. Give your health care provider a list of all the medicines, herbs, non-prescription drugs, or dietary  supplements you use. Also tell them if you smoke, drink alcohol, or use illegal drugs. Some items may interact with your medicine. What should I watch for while using this medicine? Visit your doctor or health care professional for regular checkups. It may be some time before you see the benefit from this medicine. Do not stop taking your medicine unless your doctor tells you to. Your doctor may order blood tests or other tests to see how you are doing. Women should inform their doctor if they wish to become pregnant or think they might be pregnant. There is a potential for serious side effects to an unborn child. Talk to your health care professional or pharmacist for more information. You should make sure that you get enough calcium and vitamin D while you are taking this medicine. Discuss the foods you eat and the vitamins you take with your health care professional. Some people who take this medicine have severe bone, joint, and/or muscle pain. This medicine may also increase your risk for jaw problems or a broken thigh bone. Tell your doctor right away if you have severe pain in your jaw, bones, joints, or muscles. Tell your doctor if you have any pain that does not go away or that gets worse. Tell your dentist and dental surgeon that you are taking this medicine. You should not have major dental surgery while on this medicine. See your dentist to have a dental exam and fix any dental problems before starting this medicine. Take good care of your teeth while on this medicine. Make sure you see your dentist for regular follow-up appointments. What side effects may I notice from receiving this medicine? Side effects that you should report to your doctor or health care professional as soon as possible:  allergic reactions like skin rash, itching or hives, swelling of the face, lips, or tongue  anxiety, confusion, or depression  breathing problems  changes in vision  eye pain  feeling faint or  lightheaded, falls  jaw pain, especially after dental work  mouth sores  muscle cramps, stiffness, or weakness  redness, blistering, peeling or loosening of the skin, including inside the mouth  trouble passing urine or change in the amount of urine Side effects that usually do not require medical attention (report to your doctor or health care professional if they continue or are bothersome):  bone, joint, or muscle pain  constipation  diarrhea  fever  hair loss  irritation at site where injected  loss of appetite  nausea, vomiting  stomach upset  trouble sleeping  trouble swallowing  weak or tired This list may not describe all possible side effects. Call your doctor for medical advice about side effects. You may report side effects to FDA at 1-800-FDA-1088. Where should I keep my medicine? This drug is given in a hospital or clinic and will not be stored at home. NOTE: This sheet is a summary. It may not cover all possible  information. If you have questions about this medicine, talk to your doctor, pharmacist, or health care provider.  2020 Elsevier/Gold Standard (2013-07-04 14:19:39)

## 2019-07-22 LAB — CANCER ANTIGEN 27.29: CA 27.29: 23.9 U/mL (ref 0.0–38.6)

## 2019-09-03 ENCOUNTER — Encounter: Payer: Self-pay | Admitting: Hematology and Oncology

## 2019-09-03 DIAGNOSIS — C50912 Malignant neoplasm of unspecified site of left female breast: Secondary | ICD-10-CM

## 2019-09-15 ENCOUNTER — Other Ambulatory Visit: Payer: Self-pay | Admitting: Hematology and Oncology

## 2019-09-15 ENCOUNTER — Telehealth: Payer: Self-pay

## 2019-09-15 ENCOUNTER — Other Ambulatory Visit: Payer: Self-pay

## 2019-09-15 MED ORDER — TAMOXIFEN CITRATE 20 MG PO TABS
20.0000 mg | ORAL_TABLET | Freq: Every day | ORAL | 5 refills | Status: DC
Start: 2019-09-15 — End: 2020-01-27

## 2019-09-15 NOTE — Telephone Encounter (Signed)
error 

## 2019-09-15 NOTE — Telephone Encounter (Signed)
Per Dr Mike Gip  Please call patient. OK to switch from Femara to tamoxifen.   Rx sent in.    Let's get a CMP now and LFTs in 1 month to ensure no changes.   M   Message text

## 2019-09-15 NOTE — Telephone Encounter (Signed)
Patient aware. She states that she would like this lab work done at Dr [employer] office. Advised patient to contact them to see if they can access lab orders we placed or if they will need to place their own for the CMP now and LFT in one month. Told patient to call back with any issues.

## 2019-09-17 ENCOUNTER — Telehealth: Payer: Self-pay

## 2019-09-17 ENCOUNTER — Telehealth: Payer: Self-pay | Admitting: *Deleted

## 2019-09-17 DIAGNOSIS — Z0000 Encounter for general adult medical examination without abnormal findings: Secondary | ICD-10-CM

## 2019-09-17 NOTE — Telephone Encounter (Signed)
Pt would like to have labs drawn prior to her appt on 09/30/2019. I have ordered Lipid, CMP, CBC, A1c, TSH. Is there anything else that needs to be ordered?

## 2019-09-17 NOTE — Telephone Encounter (Addendum)
Patient called stating that she is going to have lab drawn at [facility] that Dr Mike Gip wants her to have before starting Tamoxifen. She said that the order needs to be changed to "Clinic collect" so that they can draw it and she asks that you call her back to let her know this has been done [phone]

## 2019-09-17 NOTE — Telephone Encounter (Signed)
Lab orders has been faxed toi the patient PCP office.

## 2019-09-17 NOTE — Telephone Encounter (Signed)
  Were you able to do this?  M

## 2019-09-22 ENCOUNTER — Other Ambulatory Visit: Payer: Self-pay

## 2019-09-22 ENCOUNTER — Other Ambulatory Visit (INDEPENDENT_AMBULATORY_CARE_PROVIDER_SITE_OTHER): Payer: No Typology Code available for payment source

## 2019-09-22 DIAGNOSIS — Z0000 Encounter for general adult medical examination without abnormal findings: Secondary | ICD-10-CM | POA: Diagnosis not present

## 2019-09-22 LAB — COMPREHENSIVE METABOLIC PANEL
ALT: 27 U/L (ref 0–35)
AST: 21 U/L (ref 0–37)
Albumin: 4.3 g/dL (ref 3.5–5.2)
Alkaline Phosphatase: 70 U/L (ref 39–117)
BUN: 14 mg/dL (ref 6–23)
CO2: 29 mEq/L (ref 19–32)
Calcium: 9.3 mg/dL (ref 8.4–10.5)
Chloride: 104 mEq/L (ref 96–112)
Creatinine, Ser: 0.76 mg/dL (ref 0.40–1.20)
GFR: 80.19 mL/min (ref >60.00)
Glucose, Bld: 114 mg/dL — ABNORMAL HIGH (ref 70–99)
Potassium: 4 mEq/L (ref 3.5–5.1)
Sodium: 138 mEq/L (ref 135–145)
Total Bilirubin: 0.4 mg/dL (ref 0.2–1.2)
Total Protein: 7.3 g/dL (ref 6.0–8.3)

## 2019-09-22 LAB — CBC WITH DIFFERENTIAL/PLATELET
Basophils Absolute: 0 10*3/uL (ref 0.0–0.1)
Basophils Relative: 0.2 % (ref 0.0–3.0)
Eosinophils Absolute: 0.1 10*3/uL (ref 0.0–0.7)
Eosinophils Relative: 1 % (ref 0.0–5.0)
HCT: 39.6 % (ref 36.0–46.0)
Hemoglobin: 13.1 g/dL (ref 12.0–15.0)
Lymphocytes Relative: 22.2 % (ref 12.0–46.0)
Lymphs Abs: 2 10*3/uL (ref 0.7–4.0)
MCHC: 33.2 g/dL (ref 30.0–36.0)
MCV: 92.8 fl (ref 78.0–100.0)
Monocytes Absolute: 0.7 10*3/uL (ref 0.1–1.0)
Monocytes Relative: 7.8 % (ref 3.0–12.0)
Neutro Abs: 6.2 10*3/uL (ref 1.4–7.7)
Neutrophils Relative %: 68.8 % (ref 43.0–77.0)
Platelets: 285 10*3/uL (ref 150.0–400.0)
RBC: 4.26 Mil/uL (ref 3.87–5.11)
RDW: 13.2 % (ref 11.5–15.5)
WBC: 9.1 10*3/uL (ref 4.0–10.5)

## 2019-09-22 LAB — HEMOGLOBIN A1C: Hgb A1c MFr Bld: 6.6 % — ABNORMAL HIGH (ref 4.6–6.5)

## 2019-09-22 LAB — LIPID PANEL
Cholesterol: 223 mg/dL — ABNORMAL HIGH (ref 0–200)
HDL: 53.3 mg/dL (ref >39.00)
LDL Cholesterol: 143 mg/dL — ABNORMAL HIGH (ref 0–99)
NonHDL: 170
Total CHOL/HDL Ratio: 4
Triglycerides: 133 mg/dL (ref 0.0–149.0)
VLDL: 26.6 mg/dL (ref 0.0–40.0)

## 2019-09-22 LAB — TSH: TSH: 1.31 u[IU]/mL (ref 0.35–4.50)

## 2019-09-30 ENCOUNTER — Ambulatory Visit (INDEPENDENT_AMBULATORY_CARE_PROVIDER_SITE_OTHER): Payer: No Typology Code available for payment source | Admitting: Internal Medicine

## 2019-09-30 ENCOUNTER — Encounter: Payer: Self-pay | Admitting: Internal Medicine

## 2019-09-30 ENCOUNTER — Other Ambulatory Visit: Payer: Self-pay

## 2019-09-30 ENCOUNTER — Other Ambulatory Visit (HOSPITAL_COMMUNITY)
Admission: RE | Admit: 2019-09-30 | Discharge: 2019-09-30 | Disposition: A | Discharge status: Home / Self Care | Payer: No Typology Code available for payment source | Source: Ambulatory Visit | Attending: Internal Medicine | Admitting: Internal Medicine

## 2019-09-30 VITALS — BP 120/60 | HR 88 | Temp 98.4°F | Resp 14 | Ht 62.0 in | Wt 130.0 lb

## 2019-09-30 DIAGNOSIS — Z79899 Other long term (current) drug therapy: Secondary | ICD-10-CM

## 2019-09-30 DIAGNOSIS — E119 Type 2 diabetes mellitus without complications: Secondary | ICD-10-CM

## 2019-09-30 DIAGNOSIS — E785 Hyperlipidemia, unspecified: Secondary | ICD-10-CM | POA: Diagnosis not present

## 2019-09-30 DIAGNOSIS — Z124 Encounter for screening for malignant neoplasm of cervix: Secondary | ICD-10-CM

## 2019-09-30 DIAGNOSIS — R5383 Other fatigue: Secondary | ICD-10-CM

## 2019-09-30 DIAGNOSIS — Z0000 Encounter for general adult medical examination without abnormal findings: Secondary | ICD-10-CM | POA: Diagnosis not present

## 2019-09-30 DIAGNOSIS — G4733 Obstructive sleep apnea (adult) (pediatric): Secondary | ICD-10-CM | POA: Diagnosis not present

## 2019-09-30 DIAGNOSIS — M818 Other osteoporosis without current pathological fracture: Secondary | ICD-10-CM

## 2019-09-30 DIAGNOSIS — E1169 Type 2 diabetes mellitus with other specified complication: Secondary | ICD-10-CM | POA: Diagnosis not present

## 2019-09-30 DIAGNOSIS — C50912 Malignant neoplasm of unspecified site of left female breast: Secondary | ICD-10-CM

## 2019-09-30 DIAGNOSIS — R0683 Snoring: Secondary | ICD-10-CM

## 2019-09-30 NOTE — Patient Instructions (Signed)
Diabetes Mellitus and Standards of Medical Care Managing diabetes (diabetes mellitus) can be complicated. Your diabetes treatment may be managed by a team of health care providers, including:  A physician who specializes in diabetes (endocrinologist).  A nurse practitioner or physician assistant.  Nurses.  A diet and nutrition specialist (registered dietitian).  A certified diabetes educator (CDE).  An exercise specialist.  A pharmacist.  An eye doctor.  A foot specialist (podiatrist).  A dentist.  A primary care provider.  A mental health provider. Your health care providers follow guidelines to help you get the best quality of care. The following schedule is a general guideline for your diabetes management plan. Your health care providers may give you more specific instructions. Physical exams Upon being diagnosed with diabetes mellitus, and each year after that, your health care provider will ask about your medical and family history. He or she will also do a physical exam. Your exam may include:  Measuring your height, weight, and body mass index (BMI).  Checking your blood pressure. This will be done at every routine medical visit. Your target blood pressure may vary depending on your medical conditions, your age, and other factors.  Thyroid gland exam.  Skin exam.  Screening for damage to your nerves (peripheral neuropathy). This may include checking the pulse in your legs and feet and checking the level of sensation in your hands and feet.  A complete foot exam to inspect the structure and skin of your feet, including checking for cuts, bruises, redness, blisters, sores, or other problems.  Screening for blood vessel (vascular) problems, which may include checking the pulse in your legs and feet and checking your temperature. Blood tests Depending on your treatment plan and your personal needs, you may have the following tests done:  HbA1c (hemoglobin A1c). This  test provides information about blood sugar (glucose) control over the previous 2-3 months. It is used to adjust your treatment plan, if needed. This test will be done: ? At least 2 times a year, if you are meeting your treatment goals. ? 4 times a year, if you are not meeting your treatment goals or if treatment goals have changed.  Lipid testing, including total, LDL, and HDL cholesterol and triglyceride levels. ? The goal for LDL is less than 100 mg/dL (5.5 mmol/L). If you are at high risk for complications, the goal is less than 70 mg/dL (3.9 mmol/L). ? The goal for HDL is 40 mg/dL (2.2 mmol/L) or higher for men and 50 mg/dL (2.8 mmol/L) or higher for women. An HDL cholesterol of 60 mg/dL (3.3 mmol/L) or higher gives some protection against heart disease. ? The goal for triglycerides is less than 150 mg/dL (8.3 mmol/L).  Liver function tests.  Kidney function tests.  Thyroid function tests. Dental and eye exams  Visit your dentist two times a year.  If you have type 1 diabetes, your health care provider may recommend an eye exam 3-5 years after you are diagnosed, and then once a year after your first exam. ? For children with type 1 diabetes, a health care provider may recommend an eye exam when your child is age 10 or older and has had diabetes for 3-5 years. After the first exam, your child should get an eye exam once a year.  If you have type 2 diabetes, your health care provider may recommend an eye exam as soon as you are diagnosed, and then once a year after your first exam. Immunizations   The  yearly flu (influenza) vaccine is recommended for everyone 6 months or older who has diabetes.  The pneumonia (pneumococcal) vaccine is recommended for everyone 2 years or older who has diabetes. If you are 65 or older, you may get the pneumonia vaccine as a series of two separate shots.  The hepatitis B vaccine is recommended for adults shortly after being diagnosed with  diabetes.  Adults and children with diabetes should receive all other vaccines according to age-specific recommendations from the Centers for Disease Control and Prevention (CDC). Mental and emotional health Screening for symptoms of eating disorders, anxiety, and depression is recommended at the time of diagnosis and afterward as needed. If your screening shows that you have symptoms (positive screening result), you may need more evaluation and you may work with a mental health care provider. Treatment plan Your treatment plan will be reviewed at every medical visit. You and your health care provider will discuss:  How you are taking your medicines, including insulin.  Any side effects you are experiencing.  Your blood glucose target goals.  The frequency of your blood glucose monitoring.  Lifestyle habits, such as activity level as well as tobacco, alcohol, and substance use. Diabetes self-management education Your health care provider will assess how well you are monitoring your blood glucose levels and whether you are taking your insulin correctly. He or she may refer you to:  A certified diabetes educator to manage your diabetes throughout your life, starting at diagnosis.  A registered dietitian who can create or review your personal nutrition plan.  An exercise specialist who can discuss your activity level and exercise plan. Summary  Managing diabetes (diabetes mellitus) can be complicated. Your diabetes treatment may be managed by a team of health care providers.  Your health care providers follow guidelines in order to help you get the best quality of care.  Standards of care including having regular physical exams, blood tests, blood pressure monitoring, immunizations, screening tests, and education about how to manage your diabetes.  Your health care providers may also give you more specific instructions based on your individual health. This information is not intended  to replace advice given to you by your health care provider. Make sure you discuss any questions you have with your health care provider. Document Revised: 10/25/2017 Document Reviewed: 11/04/2015 Elsevier Patient Education  2020 [employer]  

## 2019-09-30 NOTE — Progress Notes (Signed)
Patient ID: Patricia Crane, female    DOB: [birth year]  Age: 49 y.o. MRN: [id number]  The patient is here for annual PREVENTIVE  examination and management of other chronic and acute problems.  This visit occurred during the SARS-CoV-2 public health emergency.  Safety protocols were in place, including screening questions prior to the visit, additional usage of staff PPE, and extensive cleaning of exam room while observing appropriate contact time as indicated for disinfecting solutions.    Patient has received both doses of the PFIZER COVID 19 vaccine without complications.  Patient continues to mask when outside of the home except when walking in yard or at safe distances from others .  Patient denies any change in mood or development of unhealthy behaviors resuting from the pandemic's restriction of activities and socialization.     The risk factors are reflected in the social history.  The roster of all physicians providing medical care to patient - is listed in the Snapshot section of the chart.  Activities of daily living:  The patient is 100% independent in all ADLs: dressing, toileting, feeding as well as independent mobility  Home safety : The patient has smoke detectors in the home. They wear seatbelts.  There are no firearms at home. There is no violence in the home.   There is no risks for hepatitis, STDs or HIV. There is no   history of blood transfusion. They have no travel history to infectious disease endemic areas of the world.  The patient has seen their dentist in the last six month. They have seen their eye doctor in the last year. They admit to slight hearing difficulty with regard to whispered voices and some television programs.  They have deferred audiologic testing in the last year.  They do not  have excessive sun exposure. Discussed the need for sun protection: hats, long sleeves and use of sunscreen if there is significant sun exposure.   Diet: the importance of a  healthy diet is discussed. They do have a healthy diet.  The benefits of regular aerobic exercise were discussed. She walks 4 times per week ,  20 minutes.   Depression screen: there are no signs or vegative symptoms of depression- irritability, change in appetite, anhedonia, sadness/tearfullness.    The following portions of the patient's history were reviewed and updated as appropriate: allergies, current medications, past family history, past medical history,  past surgical history, past social history  and problem list.  Visual acuity was not assessed per patient preference since she has regular follow up with her ophthalmologist. Hearing and body mass index were assessed and reviewed.   During the course of the visit the patient was educated and counseled about appropriate screening and preventive services including : fall prevention , diabetes screening, nutrition counseling, colorectal cancer screening, and recommended immunizations.    CC: The primary encounter diagnosis was Type 2 diabetes mellitus without complication, without long-term current use of insulin (Patricia Crane). Diagnoses of Cervical cancer screening, Long-term use of high-risk medication, Fatigue, unspecified type, Snoring, Recent onset of diabetes mellitus (Patricia Crane), Hyperlipidemia associated with type 2 diabetes mellitus (Patricia Crane), Other osteoporosis without current pathological fracture, Obstructive sleep apnea syndrome, Encounter for preventive health examination, and Malignant neoplasm of left breast, stage 2 (Patricia Crane) were also pertinent to this visit.   1) New Onset Type 2 DM diagnosed with a1c of 6.6.  Role of diet and exercise discussed   2) History of Breast cancer: diagnosed in 2014.  Treated with no recurrence.  Most recent ammogram May 2021.  Dr Patricia Crane managing surveillance.   3) Osteoporosis:  By spine T scores.  Tolerating alendronate.  No history of fractures. Last DEXA  2021 showed progressive bone loss in spine fompared to  2019  (t Score -3.1)  History Patricia Crane has a past medical history of Basal cell carcinoma of cheek (05/19/2013), Breast cancer (Patricia Crane) (2014), Chicken pox, Constipation (05/19/2013), Heart murmur, Personal history of radiation therapy (2014), Shingles, Skin cancer, and Thyroid disease.   She has a past surgical history that includes lasix (Bilateral); Breast lumpectomy (Left, 08/18/2012); Colonoscopy with propofol (N/A, 12/11/2017); Hemorrhoid banding; Breast biopsy (Left, 06/2012); and Breast biopsy (Right, 04/22/2013).   Her family history includes Cancer in her father and paternal uncle; Cancer (age of onset: 81) in her paternal aunt; Dementia (age of onset: 74) in her mother; Drug abuse in her paternal aunt; Heart disease in her maternal aunt, maternal uncle, and mother; Hyperlipidemia in her brother, maternal aunt, maternal uncle, mother, and sister; Hypertension in her maternal aunt and maternal uncle; Stroke in her maternal uncle.She reports that she quit smoking about 16 years ago. Her smoking use included cigarettes. She smoked 2.00 packs per day. She has never used smokeless tobacco. She reports current alcohol use. She reports that she does not use drugs.  Outpatient Medications Prior to Visit  Medication Sig Dispense Refill  . alendronate (FOSAMAX) 70 MG tablet TAKE 1 TABLET BY MOUTH ONCE A WEEK ON AN EMPTY STOMACH WITH FULL GLASS OF WATER 12 tablet 9  . ALPRAZolam (XANAX) 0.25 MG tablet Take 1 tablet (0.25 mg total) by mouth 2 (two) times daily as needed for sleep or anxiety. 60 tablet 1  . calcium carbonate (OS-CAL) 600 MG TABS tablet Take 600 mg by mouth 2 (two) times daily with a meal.    . Cholecalciferol (D3 SUPER STRENGTH) 2000 UNITS CAPS Take by mouth daily.    . Cranberry-Vitamin C (CRANBERRY CONCENTRATE/VITAMINC) 15000-100 MG CAPS Take 1 capsule by mouth daily.    . Melatonin-Pyridoxine (MELATIN) 3-1 MG TABS Take 3 mg by mouth as needed.     . Misc Natural Products (OSTEO BI-FLEX ADV  DOUBLE ST PO) Take 1 tablet by mouth daily.     . Multiple Vitamins-Minerals (MULTIVITAMIN WITH MINERALS) tablet Take 1 tablet by mouth daily.    . Probiotic Product (ALIGN) 4 MG CAPS Take 4 mg by mouth daily.    . tamoxifen (NOLVADEX) 20 MG tablet Take 1 tablet (20 mg total) by mouth daily. 30 tablet 5  . triamcinolone cream (KENALOG) 0.1 % Apply 1 application topically 2 (two) times daily. 30 g 2  . letrozole (FEMARA) 2.5 MG tablet TAKE 1 TABLET BY MOUTH EVERY DAY 90 tablet 2  . meloxicam (MOBIC) 15 MG tablet Take 1 tablet (15 mg total) by mouth daily. (Patient not taking: Reported on 07/21/2019) 90 tablet 1  . valACYclovir (VALTREX) 500 MG tablet TAKE 1 TABLET BY MOUTH EVERY DAY 30 tablet 5   No facility-administered medications prior to visit.    Review of Systems   Patient denies headache, fevers, malaise, unintentional weight loss, skin rash, eye pain, sinus congestion and sinus pain, sore throat, dysphagia,  hemoptysis , cough, dyspnea, wheezing, chest pain, palpitations, orthopnea, edema, abdominal pain, nausea, melena, diarrhea, constipation, flank pain, dysuria, hematuria, urinary  Frequency, nocturia, numbness, tingling, seizures,  Focal weakness, Loss of consciousness,  Tremor, insomnia, depression, anxiety, and suicidal ideation.      Objective:  BP 120/60 (BP Location: Right  Arm, Patient Position: Sitting, Cuff Size: Normal)   Pulse 88   Temp 98.4 F (36.9 C) (Oral)   Resp 14   Ht 5\' 2"  (1.575 m)   Wt 130 lb (59 kg)   LMP 12/17/2012   SpO2 97%   BMI 23.78 kg/m   Physical Exam  General Appearance:    Alert, cooperative, no distress, appears stated age  Head:    Normocephalic, without obvious abnormality, atraumatic  Eyes:    PERRL, conjunctiva/corneas clear, EOM's intact, fundi    benign, both eyes  Ears:    Normal TM's and external ear canals, both ears  Nose:   Nares normal, septum midline, mucosa normal, no drainage    or sinus tenderness  Throat:   Lips, mucosa,  and tongue normal; teeth and gums normal  Neck:   Supple, symmetrical, trachea midline, no adenopathy;    thyroid:  no enlargement/tenderness/nodules; no carotid   bruit or JVD  Back:     Symmetric, no curvature, ROM normal, no CVA tenderness  Lungs:     Clear to auscultation bilaterally, respirations unlabored  Chest Wall:    No tenderness or deformity   Heart:    Regular rate and rhythm, S1 and S2 normal, no murmur, rub   or gallop  Breast Exam:    Deferred to Oncology and Surgery  Abdomen:     Soft, non-tender, bowel sounds active all four quadrants,    no masses, no organomegaly  Genitalia:    Pelvic: cervix normal in appearance, external genitalia normal, no adnexal masses or tenderness, no cervical motion tenderness, rectovaginal septum normal, uterus normal size, shape, and consistency and vagina normal without discharge  Extremities:   Extremities normal, atraumatic, no cyanosis or edema  Pulses:   2+ and symmetric all extremities  Skin:   Skin color, texture, turgor normal, no rashes or lesions  Lymph nodes:   Cervical, supraclavicular, and axillary nodes normal  Neurologic:   CNII-XII intact, normal strength, sensation and reflexes    Throughout.  Neuropathy of LE's not evident on exam      Assessment & Plan:   Problem List Items Addressed This Visit      Unprioritized   Diabetes mellitus type 2 in nonobese [facility])    Diagnosiis made by A1c only, .  She has not had a fasting glucose of 125 recordered.  FRC now elevated to 9%  However, there is no clear indication for statin therapy,  Asa or glucose lowering medications.  not overweight.  .  Role of diet and exercise discussed.  Standards of care discussed.  Lab Results  Component Value Date   HGBA1C 6.6 (H) 09/22/2019   Lab Results  Component Value Date   CREATININE 0.76 09/22/2019   Lab Results  Component Value Date   MICROALBUR <0.7 09/30/2019     Lab Results  Component Value Date   CHOL 223 (H) 09/22/2019    HDL 53.30 09/22/2019   LDLCALC 143 (H) 09/22/2019   TRIG 133.0 09/22/2019   CHOLHDL 4 09/22/2019         Encounter for preventive health examination    age appropriate education and counseling updated, referrals for preventative services and immunizations addressed, dietary and smoking counseling addressed, most recent labs reviewed.  I have personally reviewed and have noted:  1) the patient's medical and social history 2) The pt's use of alcohol, tobacco, and illicit drugs 3) The patient's current medications and supplements 4) Functional ability including ADL's, fall risk,  home safety risk, hearing and visual impairment 5) Diet and physical activities 6) Evidence for depression or mood disorder 7) The patient's height, weight, and BMI have been recorded in the chart  I have made referrals, and provided counseling and education based on review of the above      Fatigue    Etiology likely stress related.  Negative screen for thyroid, anemia,  hepatic and renal insufficiency, encourage regular exercise 5 days /week,  If snoring persists will recommend repeating sleep study given positive home test in Dec 2018  Lab Results  Component Value Date   WBC 9.1 09/22/2019   HGB 13.1 09/22/2019   HCT 39.6 09/22/2019   MCV 92.8 09/22/2019   PLT 285.0 09/22/2019   Lab Results  Component Value Date   TSH 1.31 09/22/2019   Lab Results  Component Value Date   CREATININE 0.76 09/22/2019   Lab Results  Component Value Date   ALT 27 09/22/2019   AST 21 09/22/2019   ALKPHOS 70 09/22/2019   BILITOT 0.4 09/22/2019         Hyperlipidemia associated with type 2 diabetes mellitus ([place])    FRC is 9%, doubled due to new onset diabetes diagnosed with a1c 6.6  She prefers to defer statin therapy and repeat  Lipids and a1c in 6 months. .    Lab Results  Component Value Date   CHOL 223 (H) 09/22/2019   HDL 53.30 09/22/2019   LDLCALC 143 (H) 09/22/2019   TRIG 133.0 09/22/2019    CHOLHDL 4 09/22/2019         Malignant neoplasm of breast, stage 2 ([place])    She has had no recurrence since diagnosis in 2014. Continue annual mammogram and aromatase  Inhibitor therapy       Osteoporosis    Treated since 2016 with alendronate with progression of T scores in spine only by 2 yr follow up DEXA last one in 2021  . No changes today   l      Sleep apnea    Mild by 2018 home study. Treatment deferred by patient       Snoring    Mild,  By Dec 2018 home sleep study . Patient has deferred treatment         Other Visit Diagnoses    Type 2 diabetes mellitus without complication, without long-term current use of insulin ([place])    -  Primary   Relevant Orders   Microalbumin / creatinine urine ratio (Completed)   Cervical cancer screening       Relevant Orders   Cytology - PAP( [place]) (Completed)   Long-term use of high-risk medication       Relevant Orders   Comprehensive metabolic panel      I have discontinued Gerome Sam "Peggy"'s meloxicam, valACYclovir, and letrozole. I am also having her maintain her Melatonin-Pyridoxine, Align, multivitamin with minerals, Cholecalciferol, Misc Natural Products (OSTEO BI-FLEX ADV DOUBLE ST PO), calcium carbonate, triamcinolone cream, ALPRAZolam, alendronate, tamoxifen, and Cranberry Concentrate/VitaminC.  No orders of the defined types were placed in this encounter.   Medications Discontinued During This Encounter  Medication Reason  . meloxicam (MOBIC) 15 MG tablet   . valACYclovir (VALTREX) 500 MG tablet   . letrozole (FEMARA) 2.5 MG tablet Change in therapy    Follow-up: Return in about 3 months (around 12/31/2019) for follow up diabetes.   [name], MD

## 2019-10-01 LAB — MICROALBUMIN / CREATININE URINE RATIO
Creatinine,U: 22.1 mg/dL
Microalb Creat Ratio: 3.2 mg/g (ref 0.0–30.0)
Microalb, Ur: <0.7 mg/dL (ref 0.0–1.9)

## 2019-10-02 LAB — CYTOLOGY - PAP
Comment: NEGATIVE
Diagnosis: NEGATIVE
High risk HPV: NEGATIVE

## 2019-10-03 ENCOUNTER — Encounter: Payer: Self-pay | Admitting: Internal Medicine

## 2019-10-03 DIAGNOSIS — Z0000 Encounter for general adult medical examination without abnormal findings: Secondary | ICD-10-CM | POA: Insufficient documentation

## 2019-10-03 DIAGNOSIS — Z0001 Encounter for general adult medical examination with abnormal findings: Secondary | ICD-10-CM | POA: Insufficient documentation

## 2019-10-03 DIAGNOSIS — E119 Type 2 diabetes mellitus without complications: Secondary | ICD-10-CM | POA: Insufficient documentation

## 2019-10-03 DIAGNOSIS — E1169 Type 2 diabetes mellitus with other specified complication: Secondary | ICD-10-CM | POA: Insufficient documentation

## 2019-10-03 NOTE — Assessment & Plan Note (Addendum)
Etiology likely stress related.  Negative screen for thyroid, anemia,  hepatic and renal insufficiency, encourage regular exercise 5 days /week,  If snoring persists will recommend repeating sleep study given positive home test in Dec 2018  Lab Results  Component Value Date   WBC 9.1 09/22/2019   HGB 13.1 09/22/2019   HCT 39.6 09/22/2019   MCV 92.8 09/22/2019   PLT 285.0 09/22/2019   Lab Results  Component Value Date   TSH 1.31 09/22/2019   Lab Results  Component Value Date   CREATININE 0.76 09/22/2019   Lab Results  Component Value Date   ALT 27 09/22/2019   AST 21 09/22/2019   ALKPHOS 70 09/22/2019   BILITOT 0.4 09/22/2019

## 2019-10-03 NOTE — Assessment & Plan Note (Signed)
FRC is 9%, doubled due to new onset diabetes diagnosed with a1c 6.6  She prefers to defer statin therapy and repeat  Lipids and a1c in 6 months. .    Lab Results  Component Value Date   CHOL 223 (H) 09/22/2019   HDL 53.30 09/22/2019   LDLCALC 143 (H) 09/22/2019   TRIG 133.0 09/22/2019   CHOLHDL 4 09/22/2019

## 2019-10-03 NOTE — Assessment & Plan Note (Signed)
She has had no recurrence since diagnosis in 2014. Continue annual mammogram and aromatase  Inhibitor therapy

## 2019-10-03 NOTE — Assessment & Plan Note (Signed)

## 2019-10-03 NOTE — Assessment & Plan Note (Signed)
Treated since 2016 with alendronate with progression of T scores in spine only by 2 yr follow up DEXA last one in 2021  . No changes today   l

## 2019-10-03 NOTE — Assessment & Plan Note (Signed)
Mild by 2018 home study. Treatment deferred by patient

## 2019-10-03 NOTE — Assessment & Plan Note (Signed)
Mild,  By Dec 2018 home sleep study . Patient has deferred treatment

## 2019-10-03 NOTE — Assessment & Plan Note (Addendum)
Diagnosiis made by A1c only, .  She has not had a fasting glucose of 125 recordered.  FRC now elevated to 9%  However, there is no clear indication for statin therapy,  Asa or glucose lowering medications.  not overweight.  .  Role of diet and exercise discussed.  Standards of care discussed.  Lab Results  Component Value Date   HGBA1C 6.6 (H) 09/22/2019   Lab Results  Component Value Date   CREATININE 0.76 09/22/2019   Lab Results  Component Value Date   MICROALBUR <0.7 09/30/2019     Lab Results  Component Value Date   CHOL 223 (H) 09/22/2019   HDL 53.30 09/22/2019   LDLCALC 143 (H) 09/22/2019   TRIG 133.0 09/22/2019   CHOLHDL 4 09/22/2019

## 2019-10-22 ENCOUNTER — Other Ambulatory Visit: Payer: Self-pay

## 2019-10-22 ENCOUNTER — Other Ambulatory Visit (INDEPENDENT_AMBULATORY_CARE_PROVIDER_SITE_OTHER): Payer: No Typology Code available for payment source

## 2019-10-22 DIAGNOSIS — Z79899 Other long term (current) drug therapy: Secondary | ICD-10-CM

## 2019-10-23 LAB — COMPREHENSIVE METABOLIC PANEL
ALT: 16 U/L (ref 0–35)
AST: 18 U/L (ref 0–37)
Albumin: 4.5 g/dL (ref 3.5–5.2)
Alkaline Phosphatase: 49 U/L (ref 39–117)
BUN: 15 mg/dL (ref 6–23)
CO2: 29 mEq/L (ref 19–32)
Calcium: 9.2 mg/dL (ref 8.4–10.5)
Chloride: 103 mEq/L (ref 96–112)
Creatinine, Ser: 0.83 mg/dL (ref 0.40–1.20)
GFR: 72.42 mL/min (ref >60.00)
Glucose, Bld: 138 mg/dL — ABNORMAL HIGH (ref 70–99)
Potassium: 4 mEq/L (ref 3.5–5.1)
Sodium: 140 mEq/L (ref 135–145)
Total Bilirubin: 0.3 mg/dL (ref 0.2–1.2)
Total Protein: 6.9 g/dL (ref 6.0–8.3)

## 2019-11-23 LAB — HM DIABETES EYE EXAM

## 2019-12-08 ENCOUNTER — Other Ambulatory Visit: Payer: Self-pay | Admitting: Hematology and Oncology

## 2019-12-08 DIAGNOSIS — C50919 Malignant neoplasm of unspecified site of unspecified female breast: Secondary | ICD-10-CM

## 2019-12-09 NOTE — Telephone Encounter (Signed)
error 

## 2019-12-31 ENCOUNTER — Telehealth: Payer: Self-pay | Admitting: *Deleted

## 2019-12-31 DIAGNOSIS — E119 Type 2 diabetes mellitus without complications: Secondary | ICD-10-CM

## 2019-12-31 NOTE — Telephone Encounter (Signed)
Please place future orders for lab appt.  

## 2020-01-01 ENCOUNTER — Other Ambulatory Visit (INDEPENDENT_AMBULATORY_CARE_PROVIDER_SITE_OTHER): Payer: No Typology Code available for payment source

## 2020-01-01 ENCOUNTER — Other Ambulatory Visit: Payer: Self-pay

## 2020-01-01 DIAGNOSIS — E119 Type 2 diabetes mellitus without complications: Secondary | ICD-10-CM

## 2020-01-01 LAB — COMPREHENSIVE METABOLIC PANEL
ALT: 13 U/L (ref 0–35)
AST: 16 U/L (ref 0–37)
Albumin: 4.2 g/dL (ref 3.5–5.2)
Alkaline Phosphatase: 36 U/L — ABNORMAL LOW (ref 39–117)
BUN: 13 mg/dL (ref 6–23)
CO2: 28 mEq/L (ref 19–32)
Calcium: 8.9 mg/dL (ref 8.4–10.5)
Chloride: 103 mEq/L (ref 96–112)
Creatinine, Ser: 0.71 mg/dL (ref 0.40–1.20)
GFR: 98.46 mL/min (ref >60.00)
Glucose, Bld: 100 mg/dL — ABNORMAL HIGH (ref 70–99)
Potassium: 4 mEq/L (ref 3.5–5.1)
Sodium: 138 mEq/L (ref 135–145)
Total Bilirubin: 0.5 mg/dL (ref 0.2–1.2)
Total Protein: 7 g/dL (ref 6.0–8.3)

## 2020-01-01 LAB — HEMOGLOBIN A1C: Hgb A1c MFr Bld: 6.4 % (ref 4.6–6.5)

## 2020-01-05 ENCOUNTER — Other Ambulatory Visit: Payer: Self-pay

## 2020-01-05 ENCOUNTER — Encounter: Payer: Self-pay | Admitting: Internal Medicine

## 2020-01-05 ENCOUNTER — Ambulatory Visit: Payer: No Typology Code available for payment source | Admitting: Internal Medicine

## 2020-01-05 VITALS — BP 100/66 | HR 80 | Temp 98.5°F | Resp 14 | Ht 62.0 in | Wt 113.8 lb

## 2020-01-05 DIAGNOSIS — E119 Type 2 diabetes mellitus without complications: Secondary | ICD-10-CM | POA: Diagnosis not present

## 2020-01-05 DIAGNOSIS — R634 Abnormal weight loss: Secondary | ICD-10-CM | POA: Insufficient documentation

## 2020-01-05 DIAGNOSIS — E785 Hyperlipidemia, unspecified: Secondary | ICD-10-CM | POA: Diagnosis not present

## 2020-01-05 DIAGNOSIS — E1169 Type 2 diabetes mellitus with other specified complication: Secondary | ICD-10-CM

## 2020-01-05 MED ORDER — ZOSTER VAC RECOMB ADJUVANTED 50 MCG/0.5ML IM SUSR: 0.5000 mL | Freq: Once | INTRAMUSCULAR | 1 refills | Status: AC

## 2020-01-05 NOTE — Assessment & Plan Note (Signed)
Advised to reduce rate of loss and start exercising  And eating more calories

## 2020-01-05 NOTE — Progress Notes (Signed)
Subjective:  Patient ID: Patricia Crane, female    DOB: Feb 03, 1971  Age: 49 y.o. MRN: [id number]  CC: The primary encounter diagnosis was Diabetes mellitus type 2 in nonobese [place]). Diagnoses of Hyperlipidemia associated with type 2 diabetes mellitus ([place]) and Excessive body weight loss were also pertinent to this visit.  HPI Patricia Crane presents for follow up on recently diagnosed type 2 diabetes mellitus   This visit occurred during the SARS-CoV-2 public health emergency.  Safety protocols were in place, including screening questions prior to the visit, additional usage of staff PPE, and extensive cleaning of exam room while observing appropriate contact time as indicated for disinfecting solutions.    Patient has received both doses of the available COVID 19 vaccine without complications.  Patient continues to mask when outside of the home except when walking in yard or at safe distances from others .  Patient denies any change in mood or development of unhealthy behaviors resuting from the pandemic's restriction of activities and socialization.    She has intentionally lost over ten lbs by restricting her calories to 900 daily.  She is not exercising.  Has eliminated all carbohydrates  possible   Outpatient Medications Prior to Visit  Medication Sig Dispense Refill  . alendronate (FOSAMAX) 70 MG tablet TAKE 1 TABLET BY MOUTH ONCE A WEEK ON AN EMPTY STOMACH WITH FULL GLASS OF WATER 12 tablet 9  . ALPRAZolam (XANAX) 0.25 MG tablet Take 1 tablet (0.25 mg total) by mouth 2 (two) times daily as needed for sleep or anxiety. 60 tablet 1  . calcium carbonate (OS-CAL) 600 MG TABS tablet Take 600 mg by mouth 2 (two) times daily with a meal.    . Cholecalciferol (D3 SUPER STRENGTH) 2000 UNITS CAPS Take by mouth daily.    . Cranberry-Vitamin C (CRANBERRY CONCENTRATE/VITAMINC) 15000-100 MG CAPS Take 1 capsule by mouth daily.    . Melatonin-Pyridoxine (MELATIN) 3-1 MG TABS Take 3 mg by mouth as  needed.     . Misc Natural Products (OSTEO BI-FLEX ADV DOUBLE ST PO) Take 1 tablet by mouth daily.     . Multiple Vitamins-Minerals (MULTIVITAMIN WITH MINERALS) tablet Take 1 tablet by mouth daily.    . Probiotic Product (ALIGN) 4 MG CAPS Take 4 mg by mouth daily.    . tamoxifen (NOLVADEX) 20 MG tablet Take 1 tablet (20 mg total) by mouth daily. 30 tablet 5  . triamcinolone cream (KENALOG) 0.1 % Apply 1 application topically 2 (two) times daily. 30 g 2   No facility-administered medications prior to visit.    Review of Systems;  Patient denies headache, fevers, malaise, unintentional weight loss, skin rash, eye pain, sinus congestion and sinus pain, sore throat, dysphagia,  hemoptysis , cough, dyspnea, wheezing, chest pain, palpitations, orthopnea, edema, abdominal pain, nausea, melena, diarrhea, constipation, flank pain, dysuria, hematuria, urinary  Frequency, nocturia, numbness, tingling, seizures,  Focal weakness, Loss of consciousness,  Tremor, insomnia, depression, anxiety, and suicidal ideation.      Objective:  BP 100/66 (BP Location: Left Arm, Patient Position: Sitting, Cuff Size: Normal)   Pulse 80   Temp 98.5 F (36.9 C) (Oral)   Resp 14   Ht 5\' 2"  (1.575 m)   Wt 113 lb 12.8 oz (51.6 kg)   LMP 12/17/2012   SpO2 97%   BMI 20.81 kg/m   BP Readings from Last 3 Encounters:  01/05/20 100/66  09/30/19 120/60  07/21/19 (!) 110/58    Wt Readings from Last 3  Encounters:  01/05/20 113 lb 12.8 oz (51.6 kg)  09/30/19 130 lb (59 kg)  07/21/19 130 lb 6.4 oz (59.1 kg)    General appearance: alert, cooperative and appears stated age Ears: normal TM's and external ear canals both ears Throat: lips, mucosa, and tongue normal; teeth and gums normal Neck: no adenopathy, no carotid bruit, supple, symmetrical, trachea midline and thyroid not enlarged, symmetric, no tenderness/mass/nodules Back: symmetric, no curvature. ROM normal. No CVA tenderness. Lungs: clear to auscultation  bilaterally Heart: regular rate and rhythm, S1, S2 normal, no murmur, click, rub or gallop Abdomen: soft, non-tender; bowel sounds normal; no masses,  no organomegaly Pulses: 2+ and symmetric Skin: Skin color, texture, turgor normal. No rashes or lesions Lymph nodes: Cervical, supraclavicular, and axillary nodes normal.  Lab Results  Component Value Date   HGBA1C 6.4 01/01/2020   HGBA1C 6.6 (H) 09/22/2019    Lab Results  Component Value Date   CREATININE 0.71 01/01/2020   CREATININE 0.83 10/22/2019   CREATININE 0.76 09/22/2019    Lab Results  Component Value Date   WBC 9.1 09/22/2019   HGB 13.1 09/22/2019   HCT 39.6 09/22/2019   PLT 285.0 09/22/2019   GLUCOSE 100 (H) 01/01/2020   CHOL 223 (H) 09/22/2019   TRIG 133.0 09/22/2019   HDL 53.30 09/22/2019   LDLCALC 143 (H) 09/22/2019   ALT 13 01/01/2020   AST 16 01/01/2020   NA 138 01/01/2020   K 4.0 01/01/2020   CL 103 01/01/2020   CREATININE 0.71 01/01/2020   BUN 13 01/01/2020   CO2 28 01/01/2020   TSH 1.31 09/22/2019   HGBA1C 6.4 01/01/2020   MICROALBUR <0.7 09/30/2019    No results found.  Assessment & Plan:   Problem List Items Addressed This Visit      Unprioritized   Diabetes mellitus type 2 in nonobese ([place]) - Primary     well-controlled on diet alone .  hemoglobin A1c remains < 7.0 . Patient is up-to-date on eye exams and foot exam is normal today. Patient has no microalbuminuria. Patient is tolerating statin therapy for CAD risk reduction and on ACE/ARB for renal protection and hypertension .  Lab Results  Component Value Date   HGBA1C 6.4 01/01/2020   Lab Results  Component Value Date   MICROALBUR <0.7 09/30/2019             Relevant Orders   Hemoglobin A1c   Comprehensive metabolic panel   Excessive body weight loss    Advised to reduce rate of loss and start exercising  And eating more calories       Hyperlipidemia associated with type 2 diabetes mellitus ([place])   Relevant Orders    Lipid panel      I am having Patricia Sam "Patricia Crane" start on Zoster Vaccine Adjuvanted. I am also having her maintain her Melatonin-Pyridoxine, Align, multivitamin with minerals, Cholecalciferol, Misc Natural Products (OSTEO BI-FLEX ADV DOUBLE ST PO), calcium carbonate, triamcinolone, ALPRAZolam, tamoxifen, Cranberry Concentrate/VitaminC, and alendronate.  Meds ordered this encounter  Medications  . Zoster Vaccine Adjuvanted [facility]) injection    Sig: Inject 0.5 mLs into the muscle once for 1 dose.    Dispense:  1 each    Refill:  1    There are no discontinued medications.  Follow-up: No follow-ups on file.   [name], MD

## 2020-01-05 NOTE — Patient Instructions (Signed)
Well done!   Remember that exercise is the other MAJOR influence on glycemic control   Return in  A few weeks for Pneumonia vaccine   See you in 3 months

## 2020-01-05 NOTE — Assessment & Plan Note (Signed)
well-controlled on diet alone .  hemoglobin A1c remains < 7.0 . Patient is up-to-date on eye exams and foot exam is normal today. Patient has no microalbuminuria. Patient is tolerating statin therapy for CAD risk reduction and on ACE/ARB for renal protection and hypertension .  Lab Results  Component Value Date   HGBA1C 6.4 01/01/2020   Lab Results  Component Value Date   MICROALBUR <0.7 09/30/2019

## 2020-01-26 ENCOUNTER — Ambulatory Visit (INDEPENDENT_AMBULATORY_CARE_PROVIDER_SITE_OTHER): Payer: No Typology Code available for payment source

## 2020-01-26 ENCOUNTER — Other Ambulatory Visit: Payer: Self-pay

## 2020-01-26 DIAGNOSIS — Z23 Encounter for immunization: Secondary | ICD-10-CM

## 2020-01-26 NOTE — Progress Notes (Signed)
Patient presented for pneumovax 23 injection to right deltoid, patient voiced no concerns nor showed any signs of distress during injection.

## 2020-01-27 ENCOUNTER — Other Ambulatory Visit: Payer: Self-pay | Admitting: Hematology and Oncology

## 2020-01-27 ENCOUNTER — Other Ambulatory Visit (HOSPITAL_COMMUNITY): Payer: Self-pay | Admitting: Hematology and Oncology

## 2020-02-16 ENCOUNTER — Telehealth: Payer: Self-pay

## 2020-02-16 DIAGNOSIS — C50912 Malignant neoplasm of unspecified site of left female breast: Secondary | ICD-10-CM

## 2020-02-16 NOTE — Telephone Encounter (Signed)
REFERRAL MADE

## 2020-02-16 NOTE — Telephone Encounter (Signed)
Pt is aware that referral has been placed.  

## 2020-02-16 NOTE — Telephone Encounter (Signed)
Pt said she updated her insurance plan and she will be on [employer]. They told her she needs a referral for her appointment on Monday 02/22/20 for Dr. Merlene Pulling. This is a follow up appointment on her breast cancer.

## 2020-02-18 NOTE — Progress Notes (Addendum)
[facility]  [address] [place], [place] [address] Phone: [phone]  Fax: [phone]   Clinic Day:  02/22/2020  Referring physician: [name], MD  Chief Complaint: Patricia Crane is a 49 y.o. female with stage IIA left breast cancer who is seen for 7 month assessment.   HPI: The patient was last seen in the medical oncology clinic on 07/21/2019. At that time, she is doing well.  Exam is unremarkable. Hematocrit was 39.4, hemoglobin 12.9, platelets 257,000, WBC 5,400. CMP was normal. CA27.29 was 23.9. She continued Femara, calcium, and vitamin D.  We discussed switching from Femara to tamoxifen secondary to progressive osteoporosis; patient was to call with decision.  She was not interested in Zometa or Prolia due to the cost.  The patient sent a MyChart message on 09/03/2019 stating that she was ready to switch to tamoxifen.  Tamoxifen began on 09/15/2019.  During the interim, the patient has been "pretty good." She is tolerating tamoxifen well. She does have some vaginal discharge but is not concerned about it. Dr. Derrel Nip performs her PAP smears. Her last PAP smear was in 09/30/2019 and was normal. She denies vaginal bleeding.  The patient intentionally lost weight because she was worried about her BMI; she was diagnosed with diabetes. She consumed 800-900 calories per day for 10 weeks, mostly in the form of shakes. Her diabetes is doing better but she has not had her A1c checked since 12/2019. She has started to eat normally again but notes that she can not eat as much as she did before.  She performs monthly breast self exams and has no concerns.  She saw her dentist in 01/2020 and everything was good.   Past Medical History:  Diagnosis Date  . Basal cell carcinoma of cheek 05/19/2013   Removed by Anne Fu   . Breast cancer ([place]) 2014    breast invasive mammary carcinoma  . Chicken pox   . Constipation 05/19/2013  . Heart murmur   .  Personal history of radiation therapy 2014   left breast ca  . Shingles   . Skin cancer   . Thyroid disease     Past Surgical History:  Procedure Laterality Date  . BREAST BIOPSY Left 06/2012   invasive mammary  . BREAST BIOPSY Right 04/22/2013   neg- core  . BREAST LUMPECTOMY Left 08/18/2012   invasive mammary carcinoma. Margins close < 0.41mm but negative, LN negative  . COLONOSCOPY WITH PROPOFOL N/A 12/11/2017   Procedure: COLONOSCOPY WITH PROPOFOL;  Surgeon: Jonathon Bellows, MD;  Location: [facility] ENDOSCOPY;  Service: Gastroenterology;  Laterality: N/A;  . HEMORRHOID BANDING    . lasix Bilateral     Family History  Problem Relation Age of Onset  . Hyperlipidemia Mother   . Heart disease Mother   . Dementia Mother 56       alzheimers  . Cancer Father        tonsil ca  . Hyperlipidemia Sister   . Hyperlipidemia Brother   . Heart disease Maternal Aunt   . Hyperlipidemia Maternal Aunt   . Hypertension Maternal Aunt   . Heart disease Maternal Uncle   . Hyperlipidemia Maternal Uncle   . Stroke Maternal Uncle   . Hypertension Maternal Uncle   . Cancer Paternal Aunt 41       ovarian ca  . Drug abuse Paternal Aunt   . Cancer Paternal Uncle        lung CA ,  smoker  .  Breast cancer Neg Hx     Social History:  reports that she quit smoking about 16 years ago. Her smoking use included cigarettes. She smoked 2.00 packs per day. She has never used smokeless tobacco. She reports current alcohol use. She reports that she does not use drugs. She is from [place]. She works in the [employer]. The patient is alone today.  Allergies: No Known Allergies  Current Medications: Current Outpatient Medications  Medication Sig Dispense Refill  . alendronate (FOSAMAX) 70 MG tablet TAKE 1 TABLET BY MOUTH ONCE A WEEK ON AN EMPTY STOMACH WITH FULL GLASS OF WATER 12 tablet 9  . ALPRAZolam (XANAX) 0.25 MG tablet Take 1 tablet (0.25 mg total) by mouth 2 (two) times daily as needed for sleep  or anxiety. 60 tablet 1  . calcium carbonate (OS-CAL) 600 MG TABS tablet Take 600 mg by mouth 2 (two) times daily with a meal.    . Cholecalciferol 50 MCG (2000 UT) CAPS Take by mouth daily.    . Cranberry-Vitamin C (CRANBERRY CONCENTRATE/VITAMINC) 15000-100 MG CAPS Take 1 capsule by mouth daily.    . Melatonin-Pyridoxine 3-1 MG TABS Take 3 mg by mouth as needed.     . Misc Natural Products (OSTEO BI-FLEX ADV DOUBLE ST PO) Take 1 tablet by mouth daily.     . Multiple Vitamins-Minerals (MULTIVITAMIN WITH MINERALS) tablet Take 1 tablet by mouth daily.    . Probiotic Product (ALIGN) 4 MG CAPS Take 4 mg by mouth daily.    . tamoxifen (NOLVADEX) 20 MG tablet TAKE 1 TABLET BY MOUTH EVERY DAY 90 tablet 1  . triamcinolone cream (KENALOG) 0.1 % Apply 1 application topically 2 (two) times daily. 30 g 2   No current facility-administered medications for this visit.    Review of Systems  Constitutional: Positive for weight loss (19 lbs, intentional). Negative for chills, diaphoresis, fever and malaise/fatigue.       Feels "pretty good."  HENT: Negative for congestion, ear discharge, ear pain, hearing loss, nosebleeds, sinus pain, sore throat and tinnitus.   Eyes: Negative for blurred vision and double vision.  Respiratory: Negative for cough, hemoptysis, sputum production, shortness of breath and wheezing.   Cardiovascular: Negative for chest pain, palpitations and leg swelling.  Gastrointestinal: Negative for abdominal pain, blood in stool, constipation, diarrhea, heartburn, melena, nausea and vomiting.       Colonoscopy 12/11/2017.   Genitourinary: Negative for dysuria, flank pain, frequency, hematuria and urgency.  Musculoskeletal: Negative for back pain, falls, joint pain, myalgias and neck pain.  Skin: Negative for itching and rash.       Insect bite on left axillary region.  Neurological: Negative for dizziness, tingling, sensory change, weakness and headaches.  Endo/Heme/Allergies: Does not  bruise/bleed easily.       Occasional hot flashes, improving.  Psychiatric/Behavioral: Negative for depression and memory loss. The patient is not nervous/anxious and does not have insomnia.   All other systems reviewed and are negative.  Performance status (ECOG): 0  Vitals Blood pressure (!) 114/51, pulse 73, temperature 97.6 F (36.4 C), temperature source Tympanic, resp. rate 16, weight 111 lb 12.4 oz (50.7 kg), last menstrual period 12/17/2012, SpO2 100 %.  Physical Exam Vitals and nursing note reviewed.  Constitutional:      General: She is not in acute distress.    Appearance: She is well-developed. She is not diaphoretic.  HENT:     Head: Normocephalic and atraumatic.     Mouth/Throat:  Pharynx: No oropharyngeal exudate.  Eyes:     General: No scleral icterus.    Conjunctiva/sclera: Conjunctivae normal.     Pupils: Pupils are equal, round, and reactive to light.     Comments: Brown eyes.  Neck:     Vascular: No JVD.  Cardiovascular:     Rate and Rhythm: Normal rate and regular rhythm.     Heart sounds: Normal heart sounds. No murmur heard. No gallop.   Pulmonary:     Effort: Pulmonary effort is normal. No respiratory distress.     Breath sounds: Normal breath sounds. No wheezing or rales.  Chest:     Chest wall: No tenderness.  Breasts:     Right: Skin change (fibroystic changes in upper outer quadrant) present. No inverted nipple, mass, nipple discharge, tenderness or supraclavicular adenopathy.     Left: Skin change (incision from 6-10 o clock looks good; fibrocystic changes in the upper quadrant) present. No inverted nipple, mass, nipple discharge, tenderness or supraclavicular adenopathy.    Abdominal:     General: Bowel sounds are normal. There is no distension.     Palpations: Abdomen is soft. There is no mass.     Tenderness: There is no abdominal tenderness. There is no guarding or rebound.  Musculoskeletal:        General: No tenderness. Normal range  of motion.     Cervical back: Normal range of motion and neck supple.  Lymphadenopathy:     Head:     Right side of head: No preauricular, posterior auricular or occipital adenopathy.     Left side of head: No preauricular, posterior auricular or occipital adenopathy.     Cervical: No cervical adenopathy.     Upper Body:     Right upper body: No supraclavicular adenopathy.     Left upper body: No supraclavicular adenopathy.     Lower Body: No right inguinal adenopathy. No left inguinal adenopathy.  Skin:    General: Skin is warm and dry.     Coloration: Skin is not pale.     Findings: No erythema or rash.  Neurological:     Mental Status: She is alert and oriented to person, place, and time.  Psychiatric:        Behavior: Behavior normal.        Thought Content: Thought content normal.        Judgment: Judgment normal.    Appointment on 02/22/2020  Component Date Value Ref Range Status  . Sodium 02/22/2020 139  135 - 145 mmol/L Final  . Potassium 02/22/2020 3.9  3.5 - 5.1 mmol/L Final  . Chloride 02/22/2020 105  98 - 111 mmol/L Final  . CO2 02/22/2020 26  22 - 32 mmol/L Final  . Glucose, Bld 02/22/2020 111* 70 - 99 mg/dL Final   Glucose reference range applies only to samples taken after fasting for at least 8 hours.  . BUN 02/22/2020 14  6 - 20 mg/dL Final  . Creatinine, Ser 02/22/2020 0.72  0.44 - 1.00 mg/dL Final  . Calcium 02/22/2020 8.7* 8.9 - 10.3 mg/dL Final  . Total Protein 02/22/2020 6.8  6.5 - 8.1 g/dL Final  . Albumin 02/22/2020 3.9  3.5 - 5.0 g/dL Final  . AST 02/22/2020 18  15 - 41 U/L Final  . ALT 02/22/2020 16  0 - 44 U/L Final  . Alkaline Phosphatase 02/22/2020 42  38 - 126 U/L Final  . Total Bilirubin 02/22/2020 0.6  0.3 - 1.2 mg/dL Final  .  GFR, Estimated 02/22/2020 >60  >60 mL/min Final   Comment: (NOTE) Calculated using the CKD-EPI Creatinine Equation (2021)   . Anion gap 02/22/2020 8  5 - 15 Final   Performed at [facility], [address], [place], [place] [address]  . WBC 02/22/2020 5.3  4.0 - 10.5 K/uL Final  . RBC 02/22/2020 4.18  3.87 - 5.11 MIL/uL Final  . Hemoglobin 02/22/2020 13.0  12.0 - 15.0 g/dL Final  . HCT 02/22/2020 38.3  36.0 - 46.0 % Final  . MCV 02/22/2020 91.6  80.0 - 100.0 fL Final  . MCH 02/22/2020 31.1  26.0 - 34.0 pg Final  . MCHC 02/22/2020 33.9  30.0 - 36.0 g/dL Final  . RDW 02/22/2020 12.7  11.5 - 15.5 % Final  . Platelets 02/22/2020 246  150 - 400 K/uL Final  . nRBC 02/22/2020 0.0  0.0 - 0.2 % Final  . Neutrophils Relative % 02/22/2020 49  % Final  . Neutro Abs 02/22/2020 2.6  1.7 - 7.7 K/uL Final  . Lymphocytes Relative 02/22/2020 43  % Final  . Lymphs Abs 02/22/2020 2.3  0.7 - 4.0 K/uL Final  . Monocytes Relative 02/22/2020 6  % Final  . Monocytes Absolute 02/22/2020 0.3  0.1 - 1.0 K/uL Final  . Eosinophils Relative 02/22/2020 1  % Final  . Eosinophils Absolute 02/22/2020 0.1  0.0 - 0.5 K/uL Final  . Basophils Relative 02/22/2020 1  % Final  . Basophils Absolute 02/22/2020 0.0  0.0 - 0.1 K/uL Final  . Immature Granulocytes 02/22/2020 0  % Final  . Abs Immature Granulocytes 02/22/2020 0.01  0.00 - 0.07 K/uL Final   Performed at [facility], [address] [place], [place] [address]    Assessment:  Emilie Carp is a 49 y.o. female with stage IIA left breast cancer s/p partial mastectomy and sentinel lymph node biopsy on 08/18/2012.  Pathology revealed a 2.5 cm grade III invasive ductal carcinoma of the breast.  Margins were uninvolved, but close (5 mm).  DCIS was present.  One sentinel lymph node was negative.  Tumor was ER + (60%), PR + (25%) and Her2/neu 2+ (negative by FISH).  Pathologic stage was T2N0M0.    Oncotype DX score was 24 which translated to a 16% risk of distant recurrence at 10 years with tamoxifen alone (confidence interval 12-19%).  She declined systemic chemotherapy.  BCI testing on 06/11/2017 revealed a 12.7% risk of late recurrence (years 5-10) and a  high likelihood of benefit.  She received radiation.  She was started on [facility] agonist) and Aromasin in 12/2012.  She had a reaction (inflammation at the injection site, swelling and fever). She was switched to Lupron (few joint aches and headache).  Last Lupron injection was in 08/2014.  She was on Aromasin. She began Femara on 07/02/2017 secondary to costs.   She switched to tamoxifen on 09/15/2019 secondary to osteoporosis.  Bilateral breast MRI on 12/02/2017 revealed no abnormal enhancement in either breast.  Bilateral mammogram on 07/16/2019 revealed no evidence of malignancy.   CA 27.29 was 28.2 on 11/26/2013, 23.2 on 12/01/2014,  27.6 on 10/06/2015, 24.5 on 04/05/2016, 23.3 on 10/04/2016, 21.6 on 04/23/2017, 24 on 10/24/2017, 21.8 on 05/19/2018, 27.7 on 11/18/2018, 23.9 on 07/21/2019, and 13.9 on 02/22/2020.  She has been in menopause.  Last menstrual period was 11/2012.  Estradiol was < 5 and FSH 8.2 on 01/12/2015.  Estradiol was 7.6 and FSH 7.6 on 10/06/2015.  Estradiol  was 9.0 and FSH 23.9 on 04/05/2016.  Estradiol was 10.6 and FSH 26.2 on 10/04/2016.  Estradiol was < 5 and FSH 27.2 on 04/23/2017.  Bone density on 05/02/2015 revealed osteoporosis with a T-score of -2.8 in the AP spine.  Bone density on 05/21/2017 revealed osteoporosis with a T-score of -2.8 in AP spine L1-L4.  Bone density on 07/16/2019 revealed osteoporosis with a T-score of -3.1 in the AP spine L1-L4 (L3).  She is on Fosamax, calcium, and vitamin D.  The patient received the [place] COVID-19 vaccine. She received the [employer] in 12/2019.  Symptomatically, she has felt  "pretty good." She is tolerating tamoxifen well. She has some vaginal discharge. Her last PAP smear was in 09/30/2019 and was normal. She denies vaginal bleeding.  She has intentionally lost weight because she was worried about her BMI and diabetes. She performs monthly breast self exams and has no concerns.  She saw her dentist in 01/2020  and everything was good.  Plan: 1.  Labs today: CBC with diff, CMP, CA 27.29. 2.  Stage II left breast cancer  Clinically, she is doing well             Exam reveals no evidence of recurrent disease.  Bilateral mammogram on 07/16/2019 revealed no evidence of malignancy.    CA 27.29 is 13.9 (normal).   BCI testing revealed a high risk of recurrence with plan for endocrine therapy x10 years.               She switched from Femara to tamoxifen on 09/15/2019.  Continue tamoxifen. 3.  Osteoporosis  Bone density study on 07/16/2019 revealed osteoporosis with a T score of -3.1.             Continue calcium and vitamin D.  Patient switched from Femara to tamoxifen.  Patient considering Zometa or Prolia (call with decision). 4.   Vaginal discharge  Tamoxifen noted to have at risk of vaginal discharge 12-55%.  Discuss need for yearly pelvic exams.  Discuss risk of endometrial cancer and immediate evaluation if any vaginal bleeding. 5.   Bilateral mammogram 07/15/2020. 6.   RN:  Call patient in 2 weeks regarding decision. 7.   Patient will need dental clearance prior to injection. 8.   RTC in 6 months for MD assessment, labs (CBC with diff, CMP, CA27.29), and review of mammogram.  Addendum:  Dental clearance for Prolia has been obtained from [facility] [phone])- FAX received.  I discussed the assessment and treatment plan with the patient.  The patient was provided an opportunity to ask questions and all were answered.  The patient agreed with the plan and demonstrated an understanding of the instructions.  The patient was advised to call back if the symptoms worsen or if the condition fails to improve as anticipated.   Lequita Asal, MD, PhD    02/22/2020, 9:18 AM  I, Mirian Mo Tufford, am acting as Education administrator for [employer]. Mike Gip, MD, PhD.  I, Krystle Oberman C. Mike Gip, MD, have reviewed the above documentation for accuracy and completeness, and I agree with the above.

## 2020-02-22 ENCOUNTER — Encounter: Payer: Self-pay | Admitting: Hematology and Oncology

## 2020-02-22 ENCOUNTER — Other Ambulatory Visit: Payer: Self-pay

## 2020-02-22 ENCOUNTER — Inpatient Hospital Stay: Payer: No Typology Code available for payment source

## 2020-02-22 ENCOUNTER — Telehealth: Payer: Self-pay

## 2020-02-22 ENCOUNTER — Inpatient Hospital Stay
Payer: No Typology Code available for payment source | Attending: Hematology and Oncology | Admitting: Hematology and Oncology

## 2020-02-22 VITALS — BP 114/51 | HR 73 | Temp 97.6°F | Resp 16 | Wt 111.8 lb

## 2020-02-22 DIAGNOSIS — Z87891 Personal history of nicotine dependence: Secondary | ICD-10-CM | POA: Insufficient documentation

## 2020-02-22 DIAGNOSIS — N898 Other specified noninflammatory disorders of vagina: Secondary | ICD-10-CM

## 2020-02-22 DIAGNOSIS — M818 Other osteoporosis without current pathological fracture: Secondary | ICD-10-CM | POA: Diagnosis not present

## 2020-02-22 DIAGNOSIS — Z170 Estrogen receptor positive status [ER+]: Secondary | ICD-10-CM | POA: Insufficient documentation

## 2020-02-22 DIAGNOSIS — C50912 Malignant neoplasm of unspecified site of left female breast: Secondary | ICD-10-CM | POA: Insufficient documentation

## 2020-02-22 DIAGNOSIS — E119 Type 2 diabetes mellitus without complications: Secondary | ICD-10-CM | POA: Insufficient documentation

## 2020-02-22 DIAGNOSIS — M810 Age-related osteoporosis without current pathological fracture: Secondary | ICD-10-CM | POA: Insufficient documentation

## 2020-02-22 LAB — CBC WITH DIFFERENTIAL/PLATELET
Abs Immature Granulocytes: 0.01 10*3/uL (ref 0.00–0.07)
Basophils Absolute: 0 10*3/uL (ref 0.0–0.1)
Basophils Relative: 1 %
Eosinophils Absolute: 0.1 10*3/uL (ref 0.0–0.5)
Eosinophils Relative: 1 %
HCT: 38.3 % (ref 36.0–46.0)
Hemoglobin: 13 g/dL (ref 12.0–15.0)
Immature Granulocytes: 0 %
Lymphocytes Relative: 43 %
Lymphs Abs: 2.3 10*3/uL (ref 0.7–4.0)
MCH: 31.1 pg (ref 26.0–34.0)
MCHC: 33.9 g/dL (ref 30.0–36.0)
MCV: 91.6 fL (ref 80.0–100.0)
Monocytes Absolute: 0.3 10*3/uL (ref 0.1–1.0)
Monocytes Relative: 6 %
Neutro Abs: 2.6 10*3/uL (ref 1.7–7.7)
Neutrophils Relative %: 49 %
Platelets: 246 10*3/uL (ref 150–400)
RBC: 4.18 MIL/uL (ref 3.87–5.11)
RDW: 12.7 % (ref 11.5–15.5)
WBC: 5.3 10*3/uL (ref 4.0–10.5)
nRBC: 0 % (ref 0.0–0.2)

## 2020-02-22 LAB — COMPREHENSIVE METABOLIC PANEL
ALT: 16 U/L (ref 0–44)
AST: 18 U/L (ref 15–41)
Albumin: 3.9 g/dL (ref 3.5–5.0)
Alkaline Phosphatase: 42 U/L (ref 38–126)
Anion gap: 8 (ref 5–15)
BUN: 14 mg/dL (ref 6–20)
CO2: 26 mmol/L (ref 22–32)
Calcium: 8.7 mg/dL — ABNORMAL LOW (ref 8.9–10.3)
Chloride: 105 mmol/L (ref 98–111)
Creatinine, Ser: 0.72 mg/dL (ref 0.44–1.00)
GFR, Estimated: >60 mL/min (ref >60)
Glucose, Bld: 111 mg/dL — ABNORMAL HIGH (ref 70–99)
Potassium: 3.9 mmol/L (ref 3.5–5.1)
Sodium: 139 mmol/L (ref 135–145)
Total Bilirubin: 0.6 mg/dL (ref 0.3–1.2)
Total Protein: 6.8 g/dL (ref 6.5–8.1)

## 2020-02-22 NOTE — Progress Notes (Signed)
No reports of concerns today. 

## 2020-02-22 NOTE — Patient Instructions (Signed)
Denosumab injection What is this medicine? DENOSUMAB (den oh sue mab) slows bone breakdown. Prolia is used to treat osteoporosis in women after menopause and in men, and in people who are taking corticosteroids for 6 months or more. Xgeva is used to treat a high calcium level due to cancer and to prevent bone fractures and other bone problems caused by multiple myeloma or cancer bone metastases. Xgeva is also used to treat giant cell tumor of the bone. This medicine may be used for other purposes; ask your health care provider or pharmacist if you have questions. COMMON BRAND NAME(S): Prolia, XGEVA What should I tell my health care provider before I take this medicine? They need to know if you have any of these conditions:  dental disease  having surgery or tooth extraction  infection  kidney disease  low levels of calcium or Vitamin D in the blood  malnutrition  on hemodialysis  skin conditions or sensitivity  thyroid or parathyroid disease  an unusual reaction to denosumab, other medicines, foods, dyes, or preservatives  pregnant or trying to get pregnant  breast-feeding How should I use this medicine? This medicine is for injection under the skin. It is given by a health care professional in a hospital or clinic setting. A special MedGuide will be given to you before each treatment. Be sure to read this information carefully each time. For Prolia, talk to your pediatrician regarding the use of this medicine in children. Special care may be needed. For Xgeva, talk to your pediatrician regarding the use of this medicine in children. While this drug may be prescribed for children as young as 13 years for selected conditions, precautions do apply. Overdosage: If you think you have taken too much of this medicine contact a poison control center or emergency room at once. NOTE: This medicine is only for you. Do not share this medicine with others. What if I miss a dose? It is  important not to miss your dose. Call your doctor or health care professional if you are unable to keep an appointment. What may interact with this medicine? Do not take this medicine with any of the following medications:  other medicines containing denosumab This medicine may also interact with the following medications:  medicines that lower your chance of fighting infection  steroid medicines like prednisone or cortisone This list may not describe all possible interactions. Give your health care provider a list of all the medicines, herbs, non-prescription drugs, or dietary supplements you use. Also tell them if you smoke, drink alcohol, or use illegal drugs. Some items may interact with your medicine. What should I watch for while using this medicine? Visit your doctor or health care professional for regular checks on your progress. Your doctor or health care professional may order blood tests and other tests to see how you are doing. Call your doctor or health care professional for advice if you get a fever, chills or sore throat, or other symptoms of a cold or flu. Do not treat yourself. This drug may decrease your body's ability to fight infection. Try to avoid being around people who are sick. You should make sure you get enough calcium and vitamin D while you are taking this medicine, unless your doctor tells you not to. Discuss the foods you eat and the vitamins you take with your health care professional. See your dentist regularly. Brush and floss your teeth as directed. Before you have any dental work done, tell your dentist you are   receiving this medicine. Do not become pregnant while taking this medicine or for 5 months after stopping it. Talk with your doctor or health care professional about your birth control options while taking this medicine. Women should inform their doctor if they wish to become pregnant or think they might be pregnant. There is a potential for serious side  effects to an unborn child. Talk to your health care professional or pharmacist for more information. What side effects may I notice from receiving this medicine? Side effects that you should report to your doctor or health care professional as soon as possible:  allergic reactions like skin rash, itching or hives, swelling of the face, lips, or tongue  bone pain  breathing problems  dizziness  jaw pain, especially after dental work  redness, blistering, peeling of the skin  signs and symptoms of infection like fever or chills; cough; sore throat; pain or trouble passing urine  signs of low calcium like fast heartbeat, muscle cramps or muscle pain; pain, tingling, numbness in the hands or feet; seizures  unusual bleeding or bruising  unusually weak or tired Side effects that usually do not require medical attention (report to your doctor or health care professional if they continue or are bothersome):  constipation  diarrhea  headache  joint pain  loss of appetite  muscle pain  runny nose  tiredness  upset stomach This list may not describe all possible side effects. Call your doctor for medical advice about side effects. You may report side effects to FDA at 1-800-FDA-1088. Where should I keep my medicine? This medicine is only given in a clinic, doctor's office, or other health care setting and will not be stored at home. NOTE: This sheet is a summary. It may not cover all possible information. If you have questions about this medicine, talk to your doctor, pharmacist, or health care provider.  2020 Elsevier/Gold Standard (2017-06-14 16:10:44)    Zoledronic Acid injection (Hypercalcemia, Oncology) What is this medicine? ZOLEDRONIC ACID (ZOE le dron ik AS id) lowers the amount of calcium loss from bone. It is used to treat too much calcium in your blood from cancer. It is also used to prevent complications of cancer that has spread to the bone. This medicine may  be used for other purposes; ask your health care provider or pharmacist if you have questions. COMMON BRAND NAME(S): Zometa What should I tell my health care provider before I take this medicine? They need to know if you have any of these conditions:  aspirin-sensitive asthma  cancer, especially if you are receiving medicines used to treat cancer  dental disease or wear dentures  infection  kidney disease  receiving corticosteroids like dexamethasone or prednisone  an unusual or allergic reaction to zoledronic acid, other medicines, foods, dyes, or preservatives  pregnant or trying to get pregnant  breast-feeding How should I use this medicine? This medicine is for infusion into a vein. It is given by a health care professional in a hospital or clinic setting. Talk to your pediatrician regarding the use of this medicine in children. Special care may be needed. Overdosage: If you think you have taken too much of this medicine contact a poison control center or emergency room at once. NOTE: This medicine is only for you. Do not share this medicine with others. What if I miss a dose? It is important not to miss your dose. Call your doctor or health care professional if you are unable to keep an appointment. What   may interact with this medicine?  certain antibiotics given by injection  NSAIDs, medicines for pain and inflammation, like ibuprofen or naproxen  some diuretics like bumetanide, furosemide  teriparatide  thalidomide This list may not describe all possible interactions. Give your health care provider a list of all the medicines, herbs, non-prescription drugs, or dietary supplements you use. Also tell them if you smoke, drink alcohol, or use illegal drugs. Some items may interact with your medicine. What should I watch for while using this medicine? Visit your doctor or health care professional for regular checkups. It may be some time before you see the benefit from  this medicine. Do not stop taking your medicine unless your doctor tells you to. Your doctor may order blood tests or other tests to see how you are doing. Women should inform their doctor if they wish to become pregnant or think they might be pregnant. There is a potential for serious side effects to an unborn child. Talk to your health care professional or pharmacist for more information. You should make sure that you get enough calcium and vitamin D while you are taking this medicine. Discuss the foods you eat and the vitamins you take with your health care professional. Some people who take this medicine have severe bone, joint, and/or muscle pain. This medicine may also increase your risk for jaw problems or a broken thigh bone. Tell your doctor right away if you have severe pain in your jaw, bones, joints, or muscles. Tell your doctor if you have any pain that does not go away or that gets worse. Tell your dentist and dental surgeon that you are taking this medicine. You should not have major dental surgery while on this medicine. See your dentist to have a dental exam and fix any dental problems before starting this medicine. Take good care of your teeth while on this medicine. Make sure you see your dentist for regular follow-up appointments. What side effects may I notice from receiving this medicine? Side effects that you should report to your doctor or health care professional as soon as possible:  allergic reactions like skin rash, itching or hives, swelling of the face, lips, or tongue  anxiety, confusion, or depression  breathing problems  changes in vision  eye pain  feeling faint or lightheaded, falls  jaw pain, especially after dental work  mouth sores  muscle cramps, stiffness, or weakness  redness, blistering, peeling or loosening of the skin, including inside the mouth  trouble passing urine or change in the amount of urine Side effects that usually do not require  medical attention (report to your doctor or health care professional if they continue or are bothersome):  bone, joint, or muscle pain  constipation  diarrhea  fever  hair loss  irritation at site where injected  loss of appetite  nausea, vomiting  stomach upset  trouble sleeping  trouble swallowing  weak or tired This list may not describe all possible side effects. Call your doctor for medical advice about side effects. You may report side effects to FDA at 1-800-FDA-1088. Where should I keep my medicine? This drug is given in a hospital or clinic and will not be stored at home. NOTE: This sheet is a summary. It may not cover all possible information. If you have questions about this medicine, talk to your doctor, pharmacist, or health care provider.  2020 Elsevier/Gold Standard (2013-07-04 14:19:39)  

## 2020-02-22 NOTE — Telephone Encounter (Signed)
Patient states she is taking (2) 600mg  calcium.

## 2020-02-23 LAB — CANCER ANTIGEN 27.29: CA 27.29: 13.9 U/mL (ref 0.0–38.6)

## 2020-02-28 DIAGNOSIS — N898 Other specified noninflammatory disorders of vagina: Secondary | ICD-10-CM | POA: Insufficient documentation

## 2020-03-06 ENCOUNTER — Encounter: Payer: Self-pay | Admitting: Hematology and Oncology

## 2020-03-08 ENCOUNTER — Other Ambulatory Visit: Payer: Self-pay | Admitting: Hematology and Oncology

## 2020-03-09 ENCOUNTER — Other Ambulatory Visit: Payer: Self-pay | Admitting: Hematology and Oncology

## 2020-03-09 ENCOUNTER — Telehealth: Payer: Self-pay

## 2020-03-09 NOTE — Telephone Encounter (Signed)
Patient sees Dr Zebedee Iba at [facility]

## 2020-03-09 NOTE — Telephone Encounter (Signed)
Left message for patient to call back  

## 2020-03-09 NOTE — Telephone Encounter (Signed)
-----   Message from Lequita Asal, MD sent at 03/08/2020  4:31 PM EST ----- Regarding: RE: prolia  Please call patient.  Find out how much calcium she is taking.  Prolia has not been preauthorized yet.  She should have normal calcium levels on either Prolia or Fosamax.  We will contact her again once we find out about Prolia.  M  ----- Message ----- From: Drue Dun, RN Sent: 03/08/2020   3:55 PM EST To: Lequita Asal, MD, Aubery Lapping, # Subject: Osa Craver a message from this patient (in her chart) that she wants to start prolia if possible. Please see last "patient message" from 03/06/20.

## 2020-03-10 ENCOUNTER — Telehealth: Payer: Self-pay

## 2020-03-11 ENCOUNTER — Telehealth: Payer: Self-pay | Admitting: Hematology and Oncology

## 2020-03-11 NOTE — Telephone Encounter (Signed)
03/11/2020 Called pt and informed her Prolia injections have been approved by insurance and her 1st injection is scheduled for 03/16/20 @ 3:30. Pt confirmed this appt SRW

## 2020-03-16 ENCOUNTER — Ambulatory Visit: Payer: No Typology Code available for payment source

## 2020-03-16 ENCOUNTER — Other Ambulatory Visit: Payer: Self-pay | Admitting: *Deleted

## 2020-03-16 ENCOUNTER — Other Ambulatory Visit: Payer: Self-pay

## 2020-03-16 ENCOUNTER — Inpatient Hospital Stay: Payer: No Typology Code available for payment source

## 2020-03-16 VITALS — BP 96/59 | HR 80 | Temp 97.0°F | Resp 18

## 2020-03-16 DIAGNOSIS — M818 Other osteoporosis without current pathological fracture: Secondary | ICD-10-CM

## 2020-03-16 DIAGNOSIS — C50912 Malignant neoplasm of unspecified site of left female breast: Secondary | ICD-10-CM

## 2020-03-16 LAB — BASIC METABOLIC PANEL
Anion gap: 9 (ref 5–15)
BUN: 18 mg/dL (ref 6–20)
CO2: 28 mmol/L (ref 22–32)
Calcium: 9.1 mg/dL (ref 8.9–10.3)
Chloride: 102 mmol/L (ref 98–111)
Creatinine, Ser: 0.97 mg/dL (ref 0.44–1.00)
GFR, Estimated: >60 mL/min (ref >60)
Glucose, Bld: 133 mg/dL — ABNORMAL HIGH (ref 70–99)
Potassium: 3.7 mmol/L (ref 3.5–5.1)
Sodium: 139 mmol/L (ref 135–145)

## 2020-03-16 MED ORDER — DENOSUMAB 60 MG/ML ~~LOC~~ SOSY
60.0000 mg | PREFILLED_SYRINGE | Freq: Once | SUBCUTANEOUS | Status: AC
  Administered 2020-03-16: 60 mg via SUBCUTANEOUS

## 2020-04-01 ENCOUNTER — Other Ambulatory Visit: Payer: Self-pay

## 2020-04-01 ENCOUNTER — Other Ambulatory Visit: Payer: No Typology Code available for payment source

## 2020-04-01 ENCOUNTER — Other Ambulatory Visit (INDEPENDENT_AMBULATORY_CARE_PROVIDER_SITE_OTHER): Payer: No Typology Code available for payment source

## 2020-04-01 DIAGNOSIS — E785 Hyperlipidemia, unspecified: Secondary | ICD-10-CM

## 2020-04-01 DIAGNOSIS — E119 Type 2 diabetes mellitus without complications: Secondary | ICD-10-CM

## 2020-04-01 DIAGNOSIS — E1169 Type 2 diabetes mellitus with other specified complication: Secondary | ICD-10-CM

## 2020-04-01 LAB — COMPREHENSIVE METABOLIC PANEL
ALT: 11 U/L (ref 0–35)
AST: 14 U/L (ref 0–37)
Albumin: 4.3 g/dL (ref 3.5–5.2)
Alkaline Phosphatase: 36 U/L — ABNORMAL LOW (ref 39–117)
BUN: 14 mg/dL (ref 6–23)
CO2: 30 mEq/L (ref 19–32)
Calcium: 8.9 mg/dL (ref 8.4–10.5)
Chloride: 105 mEq/L (ref 96–112)
Creatinine, Ser: 0.66 mg/dL (ref 0.40–1.20)
GFR: 101.41 mL/min (ref >60.00)
Glucose, Bld: 97 mg/dL (ref 70–99)
Potassium: 4.1 mEq/L (ref 3.5–5.1)
Sodium: 141 mEq/L (ref 135–145)
Total Bilirubin: 0.4 mg/dL (ref 0.2–1.2)
Total Protein: 6.9 g/dL (ref 6.0–8.3)

## 2020-04-01 LAB — LIPID PANEL
Cholesterol: 168 mg/dL (ref 0–200)
HDL: 59.9 mg/dL (ref >39.00)
LDL Cholesterol: 97 mg/dL (ref 0–99)
NonHDL: 107.65
Total CHOL/HDL Ratio: 3
Triglycerides: 52 mg/dL (ref 0.0–149.0)
VLDL: 10.4 mg/dL (ref 0.0–40.0)

## 2020-04-04 LAB — HEMOGLOBIN A1C: Hgb A1c MFr Bld: 6.2 % (ref 4.6–6.5)

## 2020-04-06 ENCOUNTER — Encounter: Payer: Self-pay | Admitting: Internal Medicine

## 2020-04-06 ENCOUNTER — Ambulatory Visit: Payer: No Typology Code available for payment source | Admitting: Internal Medicine

## 2020-04-06 ENCOUNTER — Other Ambulatory Visit (HOSPITAL_COMMUNITY): Payer: Self-pay | Admitting: Internal Medicine

## 2020-04-06 ENCOUNTER — Ambulatory Visit (INDEPENDENT_AMBULATORY_CARE_PROVIDER_SITE_OTHER): Payer: No Typology Code available for payment source | Admitting: Internal Medicine

## 2020-04-06 ENCOUNTER — Other Ambulatory Visit: Payer: Self-pay

## 2020-04-06 VITALS — BP 96/64 | HR 66 | Temp 98.4°F | Resp 15 | Ht 62.0 in | Wt 111.8 lb

## 2020-04-06 DIAGNOSIS — R0683 Snoring: Secondary | ICD-10-CM | POA: Diagnosis not present

## 2020-04-06 DIAGNOSIS — E119 Type 2 diabetes mellitus without complications: Secondary | ICD-10-CM | POA: Diagnosis not present

## 2020-04-06 DIAGNOSIS — L910 Hypertrophic scar: Secondary | ICD-10-CM

## 2020-04-06 DIAGNOSIS — R634 Abnormal weight loss: Secondary | ICD-10-CM

## 2020-04-06 MED ORDER — ROSUVASTATIN CALCIUM 20 MG PO TABS: ORAL_TABLET | ORAL | 0 refills | Status: DC

## 2020-04-06 NOTE — Patient Instructions (Signed)
CONGRATS ON LOWERING YOUR A1C EVEN MORE!  I HAVE SENT THE CRESTOR TO YOUR [facility] PHARMACY TO START TAKING TWICE A WEEK (IF TOLERATED)  RETURN FOR LIVER TESTS IN 3-4 WEEKS  I BELIEVE YOUR SKIN LESION IS A KELOID.  I WILL ASK DR ISENSTEIN ABOUT IT  REPEAT A1C AND LIPIDS IN 6 MONTHS

## 2020-04-06 NOTE — Progress Notes (Signed)
Subjective:  Patient ID: Patricia Crane, female    DOB: 01/07/1971  Age: 50 y.o. MRN: [id number]  CC: The primary encounter diagnosis was Diabetes mellitus type 2 in nonobese [facility]). Diagnoses of Excessive body weight loss, Snoring, and Keloid scar were also pertinent to this visit.  HPI Patricia Crane presents for follow up   This visit occurred during the SARS-CoV-2 public health emergency.  Safety protocols were in place, including screening questions prior to the visit, additional usage of staff PPE, and extensive cleaning of exam room while observing appropriate contact time as indicated for disinfecting solutions.     Cc: poor sleep  Waking up tired.  No hypersomnolence, no snoring   2) type 2 DM:  Reviewed recent labs.  She feels generally well, is exercising several times per week and checking blood sugars once daily at variable times.  BS have been under 130 fasting and < 150 post prandially.  Denies any recent hypoglyemic events.  Taking his medications as directed. Following a carbohydrate modified diet 6 days per week. Denies numbness, burning and tingling of extremities. Appetite is good.     3) STATIN THERAPY DISCUSSION  AGREES TO 2/WEEK  4) KELOID ON MONS PUBIS?  Occurred after a rash  HAS LARGE KELOID ON LEFT SHOULDER   Outpatient Medications Prior to Visit  Medication Sig Dispense Refill  . ALPRAZolam (XANAX) 0.25 MG tablet Take 1 tablet (0.25 mg total) by mouth 2 (two) times daily as needed for sleep or anxiety. 60 tablet 1  . calcium carbonate (OS-CAL) 600 MG TABS tablet Take 600 mg by mouth 2 (two) times daily with a meal.    . Cholecalciferol 50 MCG (2000 UT) CAPS Take by mouth daily.    . Cranberry-Vitamin C (CRANBERRY CONCENTRATE/VITAMINC) 15000-100 MG CAPS Take 1 capsule by mouth daily.    . Melatonin-Pyridoxine 3-1 MG TABS Take 3 mg by mouth as needed.     . Misc Natural Products (OSTEO BI-FLEX ADV DOUBLE ST PO) Take 1 tablet by mouth daily.     . Multiple  Vitamins-Minerals (MULTIVITAMIN WITH MINERALS) tablet Take 1 tablet by mouth daily.    . Probiotic Product (ALIGN) 4 MG CAPS Take 4 mg by mouth daily.    . tamoxifen (NOLVADEX) 20 MG tablet TAKE 1 TABLET BY MOUTH EVERY DAY 90 tablet 1  . alendronate (FOSAMAX) 70 MG tablet TAKE 1 TABLET BY MOUTH ONCE A WEEK ON AN EMPTY STOMACH WITH FULL GLASS OF WATER 12 tablet 9  . triamcinolone cream (KENALOG) 0.1 % Apply 1 application topically 2 (two) times daily. 30 g 2   No facility-administered medications prior to visit.    Review of Systems;  Patient denies headache, fevers, malaise, unintentional weight loss, skin rash, eye pain, sinus congestion and sinus pain, sore throat, dysphagia,  hemoptysis , cough, dyspnea, wheezing, chest pain, palpitations, orthopnea, edema, abdominal pain, nausea, melena, diarrhea, constipation, flank pain, dysuria, hematuria, urinary  Frequency, nocturia, numbness, tingling, seizures,  Focal weakness, Loss of consciousness,  Tremor, insomnia, depression, anxiety, and suicidal ideation.      Objective:  BP 96/64 (BP Location: Right Arm, Patient Position: Sitting, Cuff Size: Normal)   Pulse 66   Temp 98.4 F (36.9 C) (Oral)   Resp 15   Ht 5\' 2"  (1.575 m)   Wt 111 lb 12.8 oz (50.7 kg)   LMP 12/17/2012   SpO2 99%   BMI 20.45 kg/m   BP Readings from Last 3 Encounters:  04/06/20 96/64  03/16/20 (!) 96/59  02/22/20 (!) 114/51    Wt Readings from Last 3 Encounters:  04/06/20 111 lb 12.8 oz (50.7 kg)  02/22/20 111 lb 12.4 oz (50.7 kg)  01/05/20 113 lb 12.8 oz (51.6 kg)    General appearance: alert, cooperative and appears stated age Ears: normal TM's and external ear canals both ears Throat: lips, mucosa, and tongue normal; teeth and gums normal Neck: no adenopathy, no carotid bruit, supple, symmetrical, trachea midline and thyroid not enlarged, symmetric, no tenderness/mass/nodules Back: symmetric, no curvature. ROM normal. No CVA tenderness. Lungs: clear to  auscultation bilaterally Heart: regular rate and rhythm, S1, S2 normal, no murmur, click, rub or gallop Abdomen: soft, non-tender; bowel sounds normal; no masses,  no organomegaly Pulses: 2+ and symmetric Skin:  Flesh colored raised scar with keloid appearance on mons pubis . large linear keloid on left shoulder/upper arm.  Lymph nodes: Cervical, supraclavicular, and axillary nodes normal.  Lab Results  Component Value Date   HGBA1C 6.2 04/01/2020   HGBA1C 6.4 01/01/2020   HGBA1C 6.6 (H) 09/22/2019    Lab Results  Component Value Date   CREATININE 0.66 04/01/2020   CREATININE 0.97 03/16/2020   CREATININE 0.72 02/22/2020    Lab Results  Component Value Date   WBC 5.3 02/22/2020   HGB 13.0 02/22/2020   HCT 38.3 02/22/2020   PLT 246 02/22/2020   GLUCOSE 97 04/01/2020   CHOL 168 04/01/2020   TRIG 52.0 04/01/2020   HDL 59.90 04/01/2020   LDLCALC 97 04/01/2020   ALT 11 04/01/2020   AST 14 04/01/2020   NA 141 04/01/2020   K 4.1 04/01/2020   CL 105 04/01/2020   CREATININE 0.66 04/01/2020   BUN 14 04/01/2020   CO2 30 04/01/2020   TSH 1.31 09/22/2019   HGBA1C 6.2 04/01/2020   MICROALBUR <0.7 09/30/2019    No results found.  Assessment & Plan:   Problem List Items Addressed This Visit      Unprioritized   Diabetes mellitus type 2 in nonobese ([place]) - Primary     well-controlled on diet alone .  hemoglobin A1c remains < 7.0 . Patient is up-to-date on eye exams and foot exam is normal today. Patient has no microalbuminuria. Patient is willing to initiate statin therapy for CAD risk reduction and on ACE/ARB for renal protection and hypertension .  Lab Results  Component Value Date   HGBA1C 6.2 04/01/2020   Lab Results  Component Value Date   MICROALBUR <0.7 09/30/2019             Relevant Medications   rosuvastatin (CRESTOR) 20 MG tablet   Other Relevant Orders   Comprehensive metabolic panel   Excessive body weight loss    Her weight has stabilized after a  loss of early 20 lbs last year       Snoring    Her snoring has resolved with weight loss and she has no hypersomnolence        Other Visit Diagnoses    Keloid scar       Relevant Orders   Ambulatory referral to Dermatology      I have discontinued Gerome Sam "Peggy"'s triamcinolone and alendronate. I am also having her start on rosuvastatin. Additionally, I am having her maintain her Melatonin-Pyridoxine, Align, multivitamin with minerals, Cholecalciferol, Misc Natural Products (OSTEO BI-FLEX ADV DOUBLE ST PO), calcium carbonate, ALPRAZolam, Cranberry Concentrate/VitaminC, and tamoxifen.  Meds ordered this encounter  Medications  . rosuvastatin (CRESTOR) 20 MG tablet  Sig: 1 tablet two times weekly    Dispense:  30 tablet    Refill:  0    Medications Discontinued During This Encounter  Medication Reason  . alendronate (FOSAMAX) 70 MG tablet Error  . triamcinolone cream (KENALOG) 0.1 %    A total of 40 minutes of face to face time was spent with patient more than half of which was spent in counselling and coordination of care  Follow-up: No follow-ups on file.   [name], MD

## 2020-04-07 ENCOUNTER — Encounter: Payer: Self-pay | Admitting: Internal Medicine

## 2020-04-07 NOTE — Assessment & Plan Note (Signed)
well-controlled on diet alone .  hemoglobin A1c remains < 7.0 . Patient is up-to-date on eye exams and foot exam is normal today. Patient has no microalbuminuria. Patient is willing to initiate statin therapy for CAD risk reduction and on ACE/ARB for renal protection and hypertension .  Lab Results  Component Value Date   HGBA1C 6.2 04/01/2020   Lab Results  Component Value Date   MICROALBUR <0.7 09/30/2019

## 2020-04-07 NOTE — Assessment & Plan Note (Signed)
Her weight has stabilized after a loss of early 20 lbs last year

## 2020-04-07 NOTE — Assessment & Plan Note (Signed)
Her snoring has resolved with weight loss and she has no hypersomnolence

## 2020-04-26 ENCOUNTER — Ambulatory Visit: Payer: No Typology Code available for payment source | Admitting: Internal Medicine

## 2020-04-29 ENCOUNTER — Other Ambulatory Visit: Payer: Self-pay

## 2020-04-29 ENCOUNTER — Other Ambulatory Visit (INDEPENDENT_AMBULATORY_CARE_PROVIDER_SITE_OTHER): Payer: No Typology Code available for payment source

## 2020-04-29 ENCOUNTER — Other Ambulatory Visit: Payer: No Typology Code available for payment source

## 2020-04-29 DIAGNOSIS — E119 Type 2 diabetes mellitus without complications: Secondary | ICD-10-CM | POA: Diagnosis not present

## 2020-04-29 LAB — COMPREHENSIVE METABOLIC PANEL
ALT: 11 U/L (ref 0–35)
AST: 14 U/L (ref 0–37)
Albumin: 4 g/dL (ref 3.5–5.2)
Alkaline Phosphatase: 36 U/L — ABNORMAL LOW (ref 39–117)
BUN: 13 mg/dL (ref 6–23)
CO2: 26 mEq/L (ref 19–32)
Calcium: 8.8 mg/dL (ref 8.4–10.5)
Chloride: 104 mEq/L (ref 96–112)
Creatinine, Ser: 0.68 mg/dL (ref 0.40–1.20)
GFR: 100.63 mL/min (ref >60.00)
Glucose, Bld: 103 mg/dL — ABNORMAL HIGH (ref 70–99)
Potassium: 4 mEq/L (ref 3.5–5.1)
Sodium: 139 mEq/L (ref 135–145)
Total Bilirubin: 0.4 mg/dL (ref 0.2–1.2)
Total Protein: 6.5 g/dL (ref 6.0–8.3)

## 2020-05-03 ENCOUNTER — Ambulatory Visit: Payer: No Typology Code available for payment source | Admitting: Internal Medicine

## 2020-05-19 ENCOUNTER — Encounter: Payer: Self-pay | Admitting: Hematology and Oncology

## 2020-07-20 ENCOUNTER — Other Ambulatory Visit: Payer: Self-pay

## 2020-07-20 ENCOUNTER — Ambulatory Visit
Admission: RE | Admit: 2020-07-20 | Discharge: 2020-07-20 | Disposition: A | Discharge status: Home / Self Care | Payer: No Typology Code available for payment source | Source: Ambulatory Visit | Attending: Hematology and Oncology | Admitting: Hematology and Oncology

## 2020-07-20 DIAGNOSIS — Z1231 Encounter for screening mammogram for malignant neoplasm of breast: Secondary | ICD-10-CM | POA: Diagnosis present

## 2020-07-20 DIAGNOSIS — Z853 Personal history of malignant neoplasm of breast: Secondary | ICD-10-CM | POA: Insufficient documentation

## 2020-07-20 DIAGNOSIS — C50912 Malignant neoplasm of unspecified site of left female breast: Secondary | ICD-10-CM

## 2020-07-21 ENCOUNTER — Encounter: Payer: Self-pay | Admitting: Oncology

## 2020-07-21 ENCOUNTER — Other Ambulatory Visit: Payer: Self-pay

## 2020-07-21 ENCOUNTER — Other Ambulatory Visit: Payer: Self-pay | Admitting: Internal Medicine

## 2020-07-21 DIAGNOSIS — E119 Type 2 diabetes mellitus without complications: Secondary | ICD-10-CM

## 2020-07-21 MED ORDER — ROSUVASTATIN CALCIUM 5 MG PO TABS
5.0000 mg | ORAL_TABLET | ORAL | 3 refills | Status: DC
  Filled 2020-07-21: qty 24, 84d supply, fill #0
  Filled 2020-10-10: qty 24, 84d supply, fill #1
  Filled 2021-01-18: qty 24, 84d supply, fill #2
  Filled 2021-04-11: qty 24, 84d supply, fill #3

## 2020-07-21 NOTE — Assessment & Plan Note (Signed)
Rosuvastatin 20 mg dose twice weekly not tolerated due to recurrent diarrhea .  Dose reduced to 5 mg twice weekly

## 2020-07-22 ENCOUNTER — Other Ambulatory Visit: Payer: Self-pay

## 2020-07-22 MED ORDER — CARESTART COVID-19 HOME TEST VI KIT
PACK | 0 refills | Status: DC
  Filled 2020-07-22: qty 2, 4d supply, fill #0

## 2020-08-03 ENCOUNTER — Telehealth: Payer: Self-pay | Admitting: Internal Medicine

## 2020-08-03 DIAGNOSIS — C50912 Malignant neoplasm of unspecified site of left female breast: Secondary | ICD-10-CM

## 2020-08-03 NOTE — Addendum Note (Signed)
Addended by: [name] on: 08/03/2020 12:53 PM   Modules accepted: Orders

## 2020-08-03 NOTE — Telephone Encounter (Signed)
Pt is aware.  

## 2020-08-03 NOTE — Telephone Encounter (Signed)
Pt called she needs a new referral placed to oncology to see Dr. Jacinto Reap

## 2020-08-03 NOTE — Telephone Encounter (Signed)
Pt is needing a new referral to [facility] to see Dr. Rogue Bussing because the provider she was seeing there has left.

## 2020-08-20 ENCOUNTER — Other Ambulatory Visit: Payer: Self-pay | Admitting: Oncology

## 2020-08-23 ENCOUNTER — Other Ambulatory Visit: Payer: Self-pay | Admitting: Oncology

## 2020-08-23 ENCOUNTER — Other Ambulatory Visit: Payer: Self-pay

## 2020-08-24 ENCOUNTER — Other Ambulatory Visit: Payer: No Typology Code available for payment source

## 2020-08-24 ENCOUNTER — Ambulatory Visit: Payer: No Typology Code available for payment source | Admitting: Hematology and Oncology

## 2020-08-26 ENCOUNTER — Other Ambulatory Visit: Payer: Self-pay | Admitting: Internal Medicine

## 2020-08-26 ENCOUNTER — Other Ambulatory Visit: Payer: Self-pay

## 2020-08-26 MED FILL — Tamoxifen Citrate Tab 20 MG (Base Equivalent): ORAL | 90 days supply | Qty: 90 | Fill #0 | Status: AC

## 2020-08-29 ENCOUNTER — Ambulatory Visit: Payer: No Typology Code available for payment source | Admitting: Hematology and Oncology

## 2020-08-29 ENCOUNTER — Telehealth: Payer: Self-pay | Admitting: *Deleted

## 2020-08-29 ENCOUNTER — Other Ambulatory Visit: Payer: No Typology Code available for payment source

## 2020-08-29 NOTE — Telephone Encounter (Signed)
Patient called on 08/26/2020 for refill a review of the chart refeals that the medication was refilled on the 8th.

## 2020-08-30 ENCOUNTER — Other Ambulatory Visit: Payer: No Typology Code available for payment source

## 2020-08-30 ENCOUNTER — Ambulatory Visit: Payer: No Typology Code available for payment source | Admitting: Hematology and Oncology

## 2020-08-30 ENCOUNTER — Ambulatory Visit: Payer: No Typology Code available for payment source

## 2020-09-06 ENCOUNTER — Encounter: Payer: Self-pay | Admitting: Oncology

## 2020-09-09 ENCOUNTER — Telehealth: Payer: Self-pay | Admitting: Internal Medicine

## 2020-09-09 DIAGNOSIS — E1169 Type 2 diabetes mellitus with other specified complication: Secondary | ICD-10-CM

## 2020-09-09 DIAGNOSIS — E119 Type 2 diabetes mellitus without complications: Secondary | ICD-10-CM

## 2020-09-09 DIAGNOSIS — Z0000 Encounter for general adult medical examination without abnormal findings: Secondary | ICD-10-CM

## 2020-09-09 NOTE — Telephone Encounter (Signed)
Pt called she has labs schedule but no orders in, she wanted to have her yearly labs and also tsh

## 2020-09-12 NOTE — Telephone Encounter (Signed)
Pt has a lab appt scheduled on 10/07/2020 before her physical in September. I have ordered TSH, CBC, CMP, A1c, microalbumin, and lipid panel. Is there anything else that needs to be ordered?

## 2020-09-12 NOTE — Addendum Note (Signed)
Addended by: Adair Laundry on: 09/12/2020 03:35 PM   Modules accepted: Orders

## 2020-09-13 ENCOUNTER — Other Ambulatory Visit: Payer: No Typology Code available for payment source

## 2020-09-13 ENCOUNTER — Ambulatory Visit: Payer: No Typology Code available for payment source | Admitting: Internal Medicine

## 2020-09-13 ENCOUNTER — Ambulatory Visit: Payer: No Typology Code available for payment source

## 2020-09-15 ENCOUNTER — Other Ambulatory Visit: Payer: Self-pay

## 2020-09-15 DIAGNOSIS — Z87891 Personal history of nicotine dependence: Secondary | ICD-10-CM | POA: Insufficient documentation

## 2020-09-15 DIAGNOSIS — C50912 Malignant neoplasm of unspecified site of left female breast: Secondary | ICD-10-CM

## 2020-09-15 DIAGNOSIS — Z170 Estrogen receptor positive status [ER+]: Secondary | ICD-10-CM | POA: Insufficient documentation

## 2020-09-15 DIAGNOSIS — M810 Age-related osteoporosis without current pathological fracture: Secondary | ICD-10-CM | POA: Insufficient documentation

## 2020-09-15 DIAGNOSIS — C50812 Malignant neoplasm of overlapping sites of left female breast: Secondary | ICD-10-CM | POA: Insufficient documentation

## 2020-09-16 ENCOUNTER — Other Ambulatory Visit: Payer: Self-pay

## 2020-09-16 ENCOUNTER — Encounter: Payer: Self-pay | Admitting: Internal Medicine

## 2020-09-16 ENCOUNTER — Inpatient Hospital Stay: Payer: No Typology Code available for payment source | Attending: Internal Medicine

## 2020-09-16 ENCOUNTER — Inpatient Hospital Stay (HOSPITAL_BASED_OUTPATIENT_CLINIC_OR_DEPARTMENT_OTHER): Payer: No Typology Code available for payment source | Admitting: Internal Medicine

## 2020-09-16 ENCOUNTER — Encounter: Payer: Self-pay | Admitting: Oncology

## 2020-09-16 DIAGNOSIS — C50812 Malignant neoplasm of overlapping sites of left female breast: Secondary | ICD-10-CM | POA: Insufficient documentation

## 2020-09-16 DIAGNOSIS — Z170 Estrogen receptor positive status [ER+]: Secondary | ICD-10-CM

## 2020-09-16 DIAGNOSIS — Z87891 Personal history of nicotine dependence: Secondary | ICD-10-CM | POA: Diagnosis not present

## 2020-09-16 DIAGNOSIS — C50912 Malignant neoplasm of unspecified site of left female breast: Secondary | ICD-10-CM

## 2020-09-16 DIAGNOSIS — M810 Age-related osteoporosis without current pathological fracture: Secondary | ICD-10-CM | POA: Insufficient documentation

## 2020-09-16 LAB — COMPREHENSIVE METABOLIC PANEL
ALT: 15 U/L (ref 0–44)
AST: 20 U/L (ref 15–41)
Albumin: 4.5 g/dL (ref 3.5–5.0)
Alkaline Phosphatase: 34 U/L — ABNORMAL LOW (ref 38–126)
Anion gap: 7 (ref 5–15)
BUN: 15 mg/dL (ref 6–20)
CO2: 29 mmol/L (ref 22–32)
Calcium: 9 mg/dL (ref 8.9–10.3)
Chloride: 102 mmol/L (ref 98–111)
Creatinine, Ser: 0.67 mg/dL (ref 0.44–1.00)
GFR, Estimated: >60 mL/min (ref >60)
Glucose, Bld: 94 mg/dL (ref 70–99)
Potassium: 3.9 mmol/L (ref 3.5–5.1)
Sodium: 138 mmol/L (ref 135–145)
Total Bilirubin: 0.5 mg/dL (ref 0.3–1.2)
Total Protein: 7.7 g/dL (ref 6.5–8.1)

## 2020-09-16 LAB — CBC WITH DIFFERENTIAL/PLATELET
Abs Immature Granulocytes: 0.01 10*3/uL (ref 0.00–0.07)
Basophils Absolute: 0.1 10*3/uL (ref 0.0–0.1)
Basophils Relative: 1 %
Eosinophils Absolute: 0.1 10*3/uL (ref 0.0–0.5)
Eosinophils Relative: 2 %
HCT: 41.5 % (ref 36.0–46.0)
Hemoglobin: 13.7 g/dL (ref 12.0–15.0)
Immature Granulocytes: 0 %
Lymphocytes Relative: 39 %
Lymphs Abs: 2.1 10*3/uL (ref 0.7–4.0)
MCH: 31.4 pg (ref 26.0–34.0)
MCHC: 33 g/dL (ref 30.0–36.0)
MCV: 95 fL (ref 80.0–100.0)
Monocytes Absolute: 0.3 10*3/uL (ref 0.1–1.0)
Monocytes Relative: 6 %
Neutro Abs: 2.8 10*3/uL (ref 1.7–7.7)
Neutrophils Relative %: 52 %
Platelets: 230 10*3/uL (ref 150–400)
RBC: 4.37 MIL/uL (ref 3.87–5.11)
RDW: 12.3 % (ref 11.5–15.5)
WBC: 5.4 10*3/uL (ref 4.0–10.5)
nRBC: 0 % (ref 0.0–0.2)

## 2020-09-16 MED FILL — Tamoxifen Citrate Tab 20 MG (Base Equivalent): ORAL | 90 days supply | Qty: 90 | Fill #0 | Status: CN

## 2020-09-16 NOTE — Progress Notes (Signed)
Discuss getting prolia at PCP office

## 2020-09-16 NOTE — Assessment & Plan Note (Addendum)
#  stage IIA left breast cancer ER/PR positive HER2 negative [IHC 2+ FISH negative]s/p partial mastectomy and sentinel lymph node biopsy on 08/18/2012.  Grade 3.  Oncotype recurrence score 24 ; declined chemotherapy.  Currently on extended adjuvant therapy tamoxifen [concern for osteoporosis; patient menopausal since 2014].  Mammogram [pcp] June 2022-no evidence of recurrence/normal limits.  #Continue tamoxifen at this time for 2 more years 2024.  # BMD- [May 2021] OsteporosisT-score of -3.1- on Tamoxifen.  Patient interested in getting Prolia with PCP I think it is reasonable.  Again reviewed the importance of avoiding Prolia about 8 weeks peri-dental extraction/oral surgeries given the risk of osteonecrosis of the jaw.   # DISPOSITION: # HOLD prolia # follow up in 12 months- MD; labs- cbc/cmp/ca-27-29-Dr.B 

## 2020-09-16 NOTE — Progress Notes (Signed)
Home CONSULT NOTE  Patient Care Team: [name], MD as PCP - General (Internal Medicine)  CHIEF COMPLAINTS/PURPOSE OF CONSULTATION:  Breast cancer  #  Oncology History Overview Note  50 year old female status post partial mastectomy for a stage II (T2, N0, M0) invasive mammary carcinoma ER/PR positive HER-2/neu negative by fish with Oncotype score of 24 and patient declining systemic chemotherapy. left  breast  lower inner quadrant tumor 2.patient did not want chemotherapy.  Had finished radiation therapy (October, 2014) 3.  Starting anti-hormonal therapy  Trelstarand Aromasin nov 2014]. 4.abnormal right breast mammogram(April of 2015) biopsies negative  for malignancy  -------------------------------------------------------------  Patricia Crane is a 50 y.o. female with stage IIA left breast cancer s/p partial mastectomy and sentinel lymph node biopsy on 08/18/2012.  Pathology revealed a 2.5 cm grade III invasive ductal carcinoma of the breast.  Margins were uninvolved, but close (5 mm).  DCIS was present.  One sentinel lymph node was negative.  Tumor was ER + (60%), PR + (25%) and Her2/neu 2+ (negative by FISH).  Pathologic stage was T2N0M0.     Oncotype DX score was 24 which translated to a 16% risk of distant recurrence at 10 years with tamoxifen alone (confidence interval 12-19%).  She declined systemic chemotherapy.  BCI testing on 06/11/2017 revealed a 12.7% risk of late recurrence (years 5-10) and a high likelihood of benefit.   She received radiation.  She was started on Trelstar [facility] agonist) and Aromasin in 12/2012.  She had a reaction (inflammation at the injection site, swelling and fever). She was switched to Lupron (few joint aches and headache).  Last Lupron injection was in 08/2014.  She was on Aromasin. She began Femara on 07/02/2017 secondary to costs.   She switched to tamoxifen on 09/15/2019 secondary to osteoporosis.   Bilateral breast MRI on  12/02/2017 revealed no abnormal enhancement in either breast.  Bilateral mammogram on 07/16/2019 revealed no evidence of malignancy.   CA 27.29 was 28.2 on 11/26/2013, 23.2 on 12/01/2014,  27.6 on 10/06/2015, 24.5 on 04/05/2016, 23.3 on 10/04/2016, 21.6 on 04/23/2017, 24 on 10/24/2017, 21.8 on 05/19/2018, 27.7 on 11/18/2018, 23.9 on 07/21/2019, and 13.9 on 02/22/2020.   She has been in menopause.  Last menstrual period was 11/2012.  Estradiol was < 5 and FSH 8.2 on 01/12/2015.  Estradiol was 7.6 and FSH 7.6 on 10/06/2015.  Estradiol was 9.0 and FSH 23.9 on 04/05/2016.  Estradiol was 10.6 and FSH 26.2 on 10/04/2016.  Estradiol was < 5 and FSH 27.2 on 04/23/2017.   Bone density on 05/02/2015 revealed osteoporosis with a T-score of -2.8 in the AP spine.  Bone density on 05/21/2017 revealed osteoporosis with a T-score of -2.8 in AP spine L1-L4.  Bone density on 07/16/2019 revealed osteoporosis with a T-score of -3.1 in the AP spine L1-L4 (L3).  She is on Fosamax, calcium, and vitamin D.   Malignant neoplasm of breast, stage 2 ([place])  06/25/2012 Initial Diagnosis   Malignant neoplasm of breast, stage 2    Basal cell carcinoma of cheek ([place])  05/19/2013 Initial Diagnosis   Basal cell carcinoma of cheek    Carcinoma of overlapping sites of left breast in female, estrogen receptor positive ([place])  09/16/2020 Initial Diagnosis   Carcinoma of overlapping sites of left breast in female, estrogen receptor positive ([place])      HISTORY OF PRESENTING ILLNESS:  Patricia Crane 50 y.o.  female postmenopausal with history of stage II ER/PR positive HER2 negative  breast cancer currently on tamoxifen is here for follow-up.  Patient denies any worsening joint pains or hot flashes.  Denies any nausea vomiting abdominal pain.  Denies any headaches.  Review of Systems  Constitutional:  Negative for chills, diaphoresis, fever, malaise/fatigue and weight loss.  HENT:  Negative for nosebleeds and sore throat.    Eyes:  Negative for double vision.  Respiratory:  Negative for cough, hemoptysis, sputum production, shortness of breath and wheezing.   Cardiovascular:  Negative for chest pain, palpitations, orthopnea and leg swelling.  Gastrointestinal:  Negative for abdominal pain, blood in stool, constipation, diarrhea, heartburn, melena, nausea and vomiting.  Genitourinary:  Negative for dysuria, frequency and urgency.  Musculoskeletal:  Negative for back pain and joint pain.  Skin: Negative.  Negative for itching and rash.  Neurological:  Negative for dizziness, tingling, focal weakness, weakness and headaches.  Endo/Heme/Allergies:  Does not bruise/bleed easily.  Psychiatric/Behavioral:  Negative for depression. The patient is not nervous/anxious and does not have insomnia.     MEDICAL HISTORY:  Past Medical History:  Diagnosis Date   Basal cell carcinoma of cheek 05/19/2013   Removed by Anne Fu    Breast cancer [facility]) 2014    breast invasive mammary carcinoma   Chicken pox    Constipation 05/19/2013   Exposure to COVID-19 virus 08/06/2018   Heart murmur    Personal history of radiation therapy 2014   left breast ca   Shingles    Skin cancer    Thyroid disease     SURGICAL HISTORY: Past Surgical History:  Procedure Laterality Date   BREAST BIOPSY Left 06/2012   invasive mammary   BREAST BIOPSY Right 04/22/2013   neg- core   BREAST LUMPECTOMY Left 08/18/2012   invasive mammary carcinoma. Margins close < 0.99m but negative, LN negative   COLONOSCOPY WITH PROPOFOL N/A 12/11/2017   Procedure: COLONOSCOPY WITH PROPOFOL;  Surgeon: AJonathon Bellows MD;  [facility];  Service: Gastroenterology;  Laterality: N/A;   HEMORRHOID BANDING     lasix Bilateral     SOCIAL HISTORY: Social History   Socioeconomic History   Marital status: Married    Spouse name: Not on file   Number of children: 0   Years of education: Not on file   Highest education level: Not on file   Occupational History   Not on file  Tobacco Use   Smoking status: Former    Packs/day: 2.00    Types: Cigarettes    Quit date: 06/10/2003    Years since quitting: 17.2   Smokeless tobacco: Never   Tobacco comments:    social smoker  Vaping Use   Vaping Use: Never used  Substance and Sexual Activity   Alcohol use: Yes    Comment: occasionally   Drug use: No   Sexual activity: Not on file  Other Topics Concern   Not on file  Social History Narrative   Not on file   Social Determinants of Health   Financial Resource Strain: Not on file  Food Insecurity: Not on file  Transportation Needs: Not on file  Physical Activity: Not on file  Stress: Not on file  Social Connections: Not on file  Intimate Partner Violence: Not on file    FAMILY HISTORY: Family History  Problem Relation Age of Onset   Hyperlipidemia Mother    Heart disease Mother    Dementia Mother 78      alzheimers   Cancer Father  tonsil ca   Hyperlipidemia Sister    Hyperlipidemia Brother    Heart disease Maternal Aunt    Hyperlipidemia Maternal Aunt    Hypertension Maternal Aunt    Heart disease Maternal Uncle    Hyperlipidemia Maternal Uncle    Stroke Maternal Uncle    Hypertension Maternal Uncle    Cancer Paternal Aunt 2       ovarian ca   Drug abuse Paternal Aunt    Cancer Paternal Uncle        lung CA ,  smoker   Breast cancer Neg Hx     ALLERGIES:  has No Known Allergies.  MEDICATIONS:  Current Outpatient Medications  Medication Sig Dispense Refill   calcium carbonate (OS-CAL) 600 MG TABS tablet Take 600 mg by mouth 2 (two) times daily with a meal.     Cholecalciferol 50 MCG (2000 UT) CAPS Take by mouth daily.     Cranberry-Vitamin C (CRANBERRY CONCENTRATE/VITAMINC) 15000-100 MG CAPS Take 1 capsule by mouth daily.     Melatonin-Pyridoxine 3-1 MG TABS Take 3 mg by mouth as needed.      Multiple Vitamins-Minerals (MULTIVITAMIN WITH MINERALS) tablet Take 1 tablet by mouth daily.      Probiotic Product (ALIGN) 4 MG CAPS Take 4 mg by mouth daily.     rosuvastatin (CRESTOR) 5 MG tablet Take 1 tablet (5 mg total) by mouth 2 (two) times a week. 24 tablet 3   COVID-19 At Home Antigen Test (CARESTART COVID-19 HOME TEST) KIT USE AS DIRECTED WITHIN PACKAGE INSTRUCTION (Patient not taking: Reported on 09/16/2020) 2 kit 0   tamoxifen (NOLVADEX) 20 MG tablet TAKE 1 TABLET BY MOUTH ONCE DAILY 90 tablet 0   No current facility-administered medications for this visit.      Marland Kitchen  PHYSICAL EXAMINATION: ECOG PERFORMANCE STATUS: 0 - Asymptomatic  Vitals:   09/16/20 0854  Resp: 16   Filed Weights   09/16/20 0854  Weight: 107 lb 9.6 oz (48.8 kg)    Physical Exam Vitals and nursing note reviewed.  Constitutional:      Comments: Alone.  Ambulating independently.  HENT:     Head: Normocephalic and atraumatic.     Mouth/Throat:     Pharynx: Oropharynx is clear.  Eyes:     Extraocular Movements: Extraocular movements intact.     Pupils: Pupils are equal, round, and reactive to light.  Cardiovascular:     Rate and Rhythm: Normal rate and regular rhythm.  Pulmonary:     Comments: Decreased breath sounds bilaterally.  Abdominal:     Palpations: Abdomen is soft.  Musculoskeletal:        General: Normal range of motion.     Cervical back: Normal range of motion.  Skin:    General: Skin is warm.  Neurological:     General: No focal deficit present.     Mental Status: She is alert and oriented to person, place, and time.  Psychiatric:        Behavior: Behavior normal.        Judgment: Judgment normal.     LABORATORY DATA:  I have reviewed the data as listed Lab Results  Component Value Date   WBC 5.4 09/16/2020   HGB 13.7 09/16/2020   HCT 41.5 09/16/2020   MCV 95.0 09/16/2020   PLT 230 09/16/2020   Recent Labs    02/22/20 0823 03/16/20 1506 04/01/20 0844 04/29/20 0838 09/16/20 0839  NA 139 139 141 139 138  K 3.9 3.7 4.1  4.0 3.9  CL 105 102 105 104 102  CO2  _0 GLUCOSE 111* 133* 97 103* 94  BUN _1 CREATININE 0.72 0.97 0.66 0.68 0.67  CALCIUM 8.7* 9.1 8.9 8.8 9.0  GFRNONAA >60 >60  --   --  >60  PROT 6.8  --  6.9 6.5 7.7  ALBUMIN 3.9  --  4.3 4.0 4.5  AST 18  --  _2 ALT 16  --  _3 ALKPHOS 42  --  36* 36* 34*  BILITOT 0.6  --  0.4 0.4 0.5    RADIOGRAPHIC STUDIES: I have personally reviewed the radiological images as listed and agreed with the findings in the report. No results found.  ASSESSMENT & PLAN:   Carcinoma of overlapping sites of left breast in female, estrogen receptor positive ([place]) #  stage IIA left breast cancer ER/PR positive HER2 negative [IHC 2+ FISH negative] s/p partial mastectomy and sentinel lymph node biopsy on 08/18/2012.  Grade 3.  Oncotype recurrence score 24 ; declined chemotherapy.  Currently on extended adjuvant therapy tamoxifen [concern for osteoporosis; patient menopausal since 2014].  Mammogram [pcp] June 2022-no evidence of recurrence/normal limits.  #Continue tamoxifen at this time for 2 more years 2024.   # BMD- [May 1252] OsteporosisT-score of -3.1- on Tamoxifen.  Patient interested in getting Prolia with PCP I think it is reasonable.  Again reviewed the importance of avoiding Prolia about 8 weeks peri-dental extraction/oral surgeries given the risk of osteonecrosis of the jaw.   # DISPOSITION: # HOLD prolia # follow up in 12 months- MD; labs- cbc/cmp/ca-27-29-Dr.B    All questions were answered. The patient knows to call the clinic with any problems, questions or concerns.       Cammie Sickle, MD 09/17/2020 11:17 AM

## 2020-09-17 ENCOUNTER — Encounter: Payer: Self-pay | Admitting: Oncology

## 2020-09-17 LAB — CANCER ANTIGEN 27.29: CA 27.29: 20 U/mL (ref 0.0–38.6)

## 2020-09-19 ENCOUNTER — Other Ambulatory Visit: Payer: Self-pay

## 2020-09-19 MED ORDER — CHLORHEXIDINE GLUCONATE 0.12 % MT SOLN
OROMUCOSAL | 0 refills | Status: DC
  Filled 2020-09-19: qty 473, 14d supply, fill #0

## 2020-09-19 MED ORDER — AMOXICILLIN 500 MG PO CAPS
ORAL_CAPSULE | ORAL | 0 refills | Status: DC
  Filled 2020-09-19: qty 21, 7d supply, fill #0

## 2020-10-06 ENCOUNTER — Encounter: Payer: Self-pay | Admitting: *Deleted

## 2020-10-07 ENCOUNTER — Other Ambulatory Visit: Payer: No Typology Code available for payment source

## 2020-10-10 ENCOUNTER — Other Ambulatory Visit: Payer: Self-pay

## 2020-10-25 ENCOUNTER — Other Ambulatory Visit: Payer: Self-pay

## 2020-10-25 ENCOUNTER — Other Ambulatory Visit (INDEPENDENT_AMBULATORY_CARE_PROVIDER_SITE_OTHER): Payer: No Typology Code available for payment source

## 2020-10-25 DIAGNOSIS — E785 Hyperlipidemia, unspecified: Secondary | ICD-10-CM

## 2020-10-25 DIAGNOSIS — E119 Type 2 diabetes mellitus without complications: Secondary | ICD-10-CM | POA: Diagnosis not present

## 2020-10-25 DIAGNOSIS — Z0000 Encounter for general adult medical examination without abnormal findings: Secondary | ICD-10-CM

## 2020-10-25 DIAGNOSIS — E1169 Type 2 diabetes mellitus with other specified complication: Secondary | ICD-10-CM

## 2020-10-25 LAB — CBC WITH DIFFERENTIAL/PLATELET
Basophils Absolute: 0 10*3/uL (ref 0.0–0.1)
Basophils Relative: 0.6 % (ref 0.0–3.0)
Eosinophils Absolute: 0.1 10*3/uL (ref 0.0–0.7)
Eosinophils Relative: 1.6 % (ref 0.0–5.0)
HCT: 38 % (ref 36.0–46.0)
Hemoglobin: 12.7 g/dL (ref 12.0–15.0)
Lymphocytes Relative: 48 % — ABNORMAL HIGH (ref 12.0–46.0)
Lymphs Abs: 2.7 10*3/uL (ref 0.7–4.0)
MCHC: 33.3 g/dL (ref 30.0–36.0)
MCV: 95.1 fl (ref 78.0–100.0)
Monocytes Absolute: 0.4 10*3/uL (ref 0.1–1.0)
Monocytes Relative: 6.7 % (ref 3.0–12.0)
Neutro Abs: 2.4 10*3/uL (ref 1.4–7.7)
Neutrophils Relative %: 43.1 % (ref 43.0–77.0)
Platelets: 218 10*3/uL (ref 150.0–400.0)
RBC: 4 Mil/uL (ref 3.87–5.11)
RDW: 13 % (ref 11.5–15.5)
WBC: 5.6 10*3/uL (ref 4.0–10.5)

## 2020-10-25 LAB — COMPREHENSIVE METABOLIC PANEL
ALT: 12 U/L (ref 0–35)
AST: 15 U/L (ref 0–37)
Albumin: 4.1 g/dL (ref 3.5–5.2)
Alkaline Phosphatase: 24 U/L — ABNORMAL LOW (ref 39–117)
BUN: 15 mg/dL (ref 6–23)
CO2: 28 mEq/L (ref 19–32)
Calcium: 8.8 mg/dL (ref 8.4–10.5)
Chloride: 103 mEq/L (ref 96–112)
Creatinine, Ser: 0.7 mg/dL (ref 0.40–1.20)
GFR: 99.58 mL/min (ref >60.00)
Glucose, Bld: 101 mg/dL — ABNORMAL HIGH (ref 70–99)
Potassium: 3.9 mEq/L (ref 3.5–5.1)
Sodium: 138 mEq/L (ref 135–145)
Total Bilirubin: 0.4 mg/dL (ref 0.2–1.2)
Total Protein: 6.8 g/dL (ref 6.0–8.3)

## 2020-10-25 LAB — HEMOGLOBIN A1C: Hgb A1c MFr Bld: 6.3 % (ref 4.6–6.5)

## 2020-10-25 LAB — LIPID PANEL
Cholesterol: 144 mg/dL (ref 0–200)
HDL: 64.7 mg/dL (ref >39.00)
LDL Cholesterol: 68 mg/dL (ref 0–99)
NonHDL: 79.24
Total CHOL/HDL Ratio: 2
Triglycerides: 56 mg/dL (ref 0.0–149.0)
VLDL: 11.2 mg/dL (ref 0.0–40.0)

## 2020-10-25 LAB — MICROALBUMIN / CREATININE URINE RATIO
Creatinine,U: 62.5 mg/dL
Microalb Creat Ratio: 1.1 mg/g (ref 0.0–30.0)
Microalb, Ur: <0.7 mg/dL (ref 0.0–1.9)

## 2020-10-25 LAB — TSH: TSH: 1.29 u[IU]/mL (ref 0.35–5.50)

## 2020-10-31 ENCOUNTER — Encounter: Payer: Self-pay | Admitting: Internal Medicine

## 2020-10-31 ENCOUNTER — Telehealth: Payer: Self-pay | Admitting: Internal Medicine

## 2020-10-31 ENCOUNTER — Other Ambulatory Visit: Payer: Self-pay

## 2020-10-31 ENCOUNTER — Ambulatory Visit (INDEPENDENT_AMBULATORY_CARE_PROVIDER_SITE_OTHER): Payer: No Typology Code available for payment source | Admitting: Internal Medicine

## 2020-10-31 VITALS — BP 108/62 | HR 76 | Temp 96.7°F | Ht 60.0 in | Wt 108.2 lb

## 2020-10-31 DIAGNOSIS — E119 Type 2 diabetes mellitus without complications: Secondary | ICD-10-CM | POA: Diagnosis not present

## 2020-10-31 DIAGNOSIS — M818 Other osteoporosis without current pathological fracture: Secondary | ICD-10-CM | POA: Diagnosis not present

## 2020-10-31 DIAGNOSIS — Z23 Encounter for immunization: Secondary | ICD-10-CM

## 2020-10-31 DIAGNOSIS — E1169 Type 2 diabetes mellitus with other specified complication: Secondary | ICD-10-CM

## 2020-10-31 DIAGNOSIS — Z114 Encounter for screening for human immunodeficiency virus [HIV]: Secondary | ICD-10-CM

## 2020-10-31 DIAGNOSIS — Z1159 Encounter for screening for other viral diseases: Secondary | ICD-10-CM

## 2020-10-31 DIAGNOSIS — Z124 Encounter for screening for malignant neoplasm of cervix: Secondary | ICD-10-CM

## 2020-10-31 DIAGNOSIS — Z0000 Encounter for general adult medical examination without abnormal findings: Secondary | ICD-10-CM | POA: Diagnosis not present

## 2020-10-31 DIAGNOSIS — Z79810 Long term (current) use of selective estrogen receptor modulators (SERMs): Secondary | ICD-10-CM

## 2020-10-31 DIAGNOSIS — Z853 Personal history of malignant neoplasm of breast: Secondary | ICD-10-CM

## 2020-10-31 MED ORDER — ZOSTER VAC RECOMB ADJUVANTED 50 MCG/0.5ML IM SUSR: 0.5000 mL | Freq: Once | INTRAMUSCULAR | 1 refills | Status: AC

## 2020-10-31 NOTE — Progress Notes (Signed)
Patient ID: Patricia Crane, female    DOB: [birth year]  Age: 50 y.o. MRN: [id number]  The patient is here for annual well woman examination and management of other chronic and acute problems.  This visit occurred during the SARS-CoV-2 public health emergency.  Safety protocols were in place, including screening questions prior to the visit, additional usage of staff PPE, and extensive cleaning of exam room while observing appropriate contact time as indicated for disinfecting solutions.     The risk factors are reflected in the social history.  The roster of all physicians providing medical care to patient - is listed in the Snapshot section of the chart.  Activities of daily living:  The patient is 100% independent in all ADLs: dressing, toileting, feeding as well as independent mobility  Home safety : The patient has smoke detectors in the home. They wear seatbelts.  There are no firearms at home. There is no violence in the home.   There is no risks for hepatitis, STDs or HIV. There is no   history of blood transfusion. They have no travel history to infectious disease endemic areas of the world.  The patient has seen their dentist in the last six month. They have seen their eye doctor in the last year. They admit to slight hearing difficulty with regard to whispered voices and some television programs.  They have deferred audiologic testing in the last year.  They do not  have excessive sun exposure. Discussed the need for sun protection: hats, long sleeves and use of sunscreen if there is significant sun exposure.   Diet: the importance of a healthy diet is discussed. They do have a healthy diet.  The benefits of regular aerobic exercise were discussed. She walks 4 times per week ,  20 minutes.   Depression screen: there are no signs or vegative symptoms of depression- irritability, change in appetite, anhedonia, sadness/tearfullness.  The following portions of the patient's history were  reviewed and updated as appropriate: allergies, current medications, past family history, past medical history,  past surgical history, past social history  and problem list.  Visual acuity was not assessed per patient preference since she has regular follow up with her ophthalmologist. Hearing and body mass index were assessed and reviewed.   During the course of the visit the patient was educated and counseled about appropriate screening and preventive services including : fall prevention , diabetes screening, nutrition counseling, colorectal cancer screening, and recommended immunizations.    CC: The primary encounter diagnosis was Need for immunization against influenza. Diagnoses of Need for Tdap vaccination, Personal history of breast cancer, Other osteoporosis without current pathological fracture, Diabetes mellitus type 2 in nonobese [facility]), and Encounter for preventive health examination were also pertinent to this visit.   1) PAP smear normal 2021 except for atrophy  2) history of BRCA :  previously managed by HemeOnc with RT and anti hormonal therapy since  2014 per patient preference (no chemo).  Last   mammogram normal June 2022 .taking tamoxifen,  year 8 of therapy   3) type 2 DM:  continued dietary control.  Complicated by dental   issues 2 weeks ago extraction of tooth,  with plans to have implants . Eating soft foods. she feels generally well, is walking several times per week and checking blood sugars once daily at variable times.  BS have been under 130 fasting and < 150 post prandially.  Denies any recent hypoglyemic events.  Following a carbohydrate modified diet  6 days per week. Denies numbness, burning and tingling of extremities. Appetite is good. Tolerating lower dose of crestor 5 mg twice weekly with some side effects which she states are toelrable.   4) Osteoporosis: started Prolia in January, but July dose was deferred due to ongoing  dental issues.  Plans to start after her  next implant in November.lifting weights (low) and "air walking " after  every meal  5) waiting to get next COVID  vaccination [employer])  due to one week of side effects    History Patricia Crane has a past medical history of Basal cell carcinoma of cheek (05/19/2013), Breast cancer ([place]) (2014), Chicken pox, Constipation (05/19/2013), Exposure to COVID-19 virus (08/06/2018), Heart murmur, Personal history of radiation therapy (2014), Shingles, Skin cancer, and Thyroid disease.   She has a past surgical history that includes lasix (Bilateral); Breast lumpectomy (Left, 08/18/2012); Colonoscopy with propofol (N/A, 12/11/2017); Hemorrhoid banding; Breast biopsy (Left, 06/2012); and Breast biopsy (Right, 04/22/2013).   Her family history includes Cancer in her father and paternal uncle; Cancer (age of onset: 46) in her paternal aunt; Dementia (age of onset: 85) in her mother; Drug abuse in her paternal aunt; Heart disease in her maternal aunt, maternal uncle, and mother; Hyperlipidemia in her brother, maternal aunt, maternal uncle, mother, and sister; Hypertension in her maternal aunt and maternal uncle; Stroke in her maternal uncle.She reports that she quit smoking about 17 years ago. Her smoking use included cigarettes. She smoked an average of 2 packs per day. She has never used smokeless tobacco. She reports current alcohol use. She reports that she does not use drugs.  Outpatient Medications Prior to Visit  Medication Sig Dispense Refill   calcium carbonate (OS-CAL) 600 MG TABS tablet Take 600 mg by mouth 2 (two) times daily with a meal.     Cholecalciferol 50 MCG (2000 UT) CAPS Take by mouth daily.     Cranberry-Vitamin C (CRANBERRY CONCENTRATE/VITAMINC) 15000-100 MG CAPS Take 1 capsule by mouth daily.     Melatonin-Pyridoxine 3-1 MG TABS Take 3 mg by mouth as needed.      Multiple Vitamins-Minerals (MULTIVITAMIN WITH MINERALS) tablet Take 1 tablet by mouth daily.     Probiotic Product (ALIGN) 4 MG CAPS Take 4  mg by mouth daily.     rosuvastatin (CRESTOR) 5 MG tablet Take 1 tablet (5 mg total) by mouth 2 (two) times a week. 24 tablet 3   tamoxifen (NOLVADEX) 20 MG tablet TAKE 1 TABLET BY MOUTH ONCE DAILY 90 tablet 0   amoxicillin (AMOXIL) 500 MG capsule Take 1 capsule by mouth three times daily until complete. Start the day before surgery. Do not take morning of the surgery. (Patient not taking: Reported on 10/31/2020) 21 capsule 0   chlorhexidine (PERIDEX) 0.12 % solution Swish with 15 mL (1 capful) in mouth for 30 seconds, then spit out twice daily. Start the day before surgery and continue the day after. (Patient not taking: Reported on 10/31/2020) 473 mL 0   COVID-19 At Home Antigen Test (CARESTART COVID-19 HOME TEST) KIT USE AS DIRECTED WITHIN PACKAGE INSTRUCTION (Patient not taking: No sig reported) 2 kit 0   No facility-administered medications prior to visit.    Review of Systems  Patient denies headache, fevers, malaise, unintentional weight loss, skin rash, eye pain, sinus congestion and sinus pain, sore throat, dysphagia,  hemoptysis , cough, dyspnea, wheezing, chest pain, palpitations, orthopnea, edema, abdominal pain, nausea, melena, diarrhea, constipation, flank pain, dysuria, hematuria, urinary  Frequency, nocturia, numbness,  tingling, seizures,  Focal weakness, Loss of consciousness,  Tremor, insomnia, depression, anxiety, and suicidal ideation.     Objective:  BP 108/62 (BP Location: Left Arm, Patient Position: Sitting, Cuff Size: Normal)   Pulse 76   Temp (!) 96.7 F (35.9 C) (Temporal)   Ht 5' (1.524 m)   Wt 108 lb 3.2 oz (49.1 kg)   LMP 12/17/2012   SpO2 97%   BMI 21.13 kg/m   Physical Exam General appearance: alert, cooperative and appears stated age Head: Normocephalic, without obvious abnormality, atraumatic Eyes: conjunctivae/corneas clear. PERRL, EOM's intact. Fundi benign. Ears: normal TM's and external ear canals both ears Nose: Nares normal. Septum midline. Mucosa  normal. No drainage or sinus tenderness. Throat: lips, mucosa, and tongue normal; teeth and gums normal Neck: no adenopathy, no carotid bruit, no JVD, supple, symmetrical, trachea midline and thyroid not enlarged, symmetric, no tenderness/mass/nodules Lungs: clear to auscultation bilaterally Breasts:  left breast with linear scar superiorly , normal appearance, no masses or tenderness Heart: regular rate and rhythm, S1, S2 normal, no murmur, click, rub or gallop Abdomen: soft, non-tender; bowel sounds normal; no masses,  no organomegaly Extremities: extremities normal, atraumatic, no cyanosis or edema Pulses: 2+ and symmetric Skin: Skin color, texture, turgor normal. No rashes or lesions Neurologic: Alert and oriented X 3, normal strength and tone. Normal symmetric reflexes. Normal coordination and gait.    Assessment & Plan:   Problem List Items Addressed This Visit       Unprioritized   Personal history of breast cancer    She has had no recurrence in 8 years.  continue annual screening mammograms      Osteoporosis    Treated from  2016to 2022  with alendronate with progression of T scores in spine only by 2 yr follow up DEXA last one in 2021  .NOw taking Prolia, first dose Jan 2022.  July dose was  suspended due to oral surgery  l      Diabetes mellitus type 2 in nonobese [facility])    Remains well controlled on diet alone. Tolerating Rosuvastatin 5 mg twice weekly.  Lab Results  Component Value Date   HGBA1C 6.3 10/25/2020   Lab Results  Component Value Date   MICROALBUR <0.7 10/25/2020   MICROALBUR <0.7 09/30/2019   Lab Results  Component Value Date   CHOL 144 10/25/2020   HDL 64.70 10/25/2020   LDLCALC 68 10/25/2020   TRIG 56.0 10/25/2020   CHOLHDL 2 10/25/2020           Encounter for preventive health examination    age appropriate education and counseling updated, referrals for preventative services and immunizations addressed, dietary and smoking counseling  addressed, most recent labs reviewed.  I have personally reviewed and have noted:   1) the patient's medical and social history 2) The pt's use of alcohol, tobacco, and illicit drugs 3) The patient's current medications and supplements 4) Functional ability including ADL's, fall risk, home safety risk, hearing and visual impairment 5) Diet and physical activities 6) Evidence for depression or mood disorder 7) The patient's height, weight, and BMI have been recorded in the chart   I have made referrals, and provided counseling and education based on review of the above      Other Visit Diagnoses     Need for immunization against influenza    -  Primary   Relevant Orders   Flu Vaccine QUAD 96mo+IM (Fluarix, Fluzone & Alfiuria Quad PF) (Completed)   Need for  Tdap vaccination       Relevant Orders   Tdap vaccine greater than or equal to 7yo IM (Completed)       Meds ordered this encounter  Medications   Zoster Vaccine Adjuvanted [facility]) injection    Sig: Inject 0.5 mLs into the muscle once for 1 dose.    Dispense:  1 each    Refill:  1    Medications Discontinued During This Encounter  Medication Reason   amoxicillin (AMOXIL) 500 MG capsule    chlorhexidine (PERIDEX) 0.12 % solution    COVID-19 At Home Antigen Test (CARESTART COVID-19 HOME TEST) KIT     Follow-up: No follow-ups on file.   [name], MD

## 2020-10-31 NOTE — Assessment & Plan Note (Signed)
Treated from  2016to 2022  with alendronate with progression of T scores in spine only by 2 yr follow up DEXA last one in 2021  .NOw taking Prolia, first dose Jan 2022.  July dose was  suspended due to oral surgery  l

## 2020-10-31 NOTE — Patient Instructions (Signed)
VAccines.gov to find out who has what  Repeat labs in 3 months (will check mg,  zinc,  hep c and HIV too )   Put 2-4 weeks between different vaccines to let your immune system recover  Dr Leafy Ro at [facility] and Dr Marcelline Mates at [facility] are well liked GYN

## 2020-10-31 NOTE — Telephone Encounter (Signed)
Patient checking out and states she was supposed to have fasting labs in 3 months. No orders in Patient chart and no check out note.   Please advise

## 2020-10-31 NOTE — Assessment & Plan Note (Signed)
She has had no recurrence in 8 years.  continue annual screening mammograms

## 2020-10-31 NOTE — Assessment & Plan Note (Signed)
Remains well controlled on diet alone. Tolerating Rosuvastatin 5 mg twice weekly.  Lab Results  Component Value Date   HGBA1C 6.3 10/25/2020   Lab Results  Component Value Date   MICROALBUR <0.7 10/25/2020   MICROALBUR <0.7 09/30/2019   Lab Results  Component Value Date   CHOL 144 10/25/2020   HDL 64.70 10/25/2020   LDLCALC 68 10/25/2020   TRIG 56.0 10/25/2020   CHOLHDL 2 10/25/2020

## 2020-10-31 NOTE — Assessment & Plan Note (Signed)

## 2020-11-01 NOTE — Telephone Encounter (Signed)
I have placed orders for fasting lab appt in 3 months per AVS. I ordered A1c, lipid panel, cmp, hep c, HIV. I did not order the zinc or magnesium cause I was not sure what diagnosis to use.   Also pt stated that she did some research and would like to see Dr. Marcelline Mates GYN.

## 2020-11-16 ENCOUNTER — Other Ambulatory Visit: Payer: Self-pay

## 2020-11-16 MED FILL — Tamoxifen Citrate Tab 20 MG (Base Equivalent): ORAL | 90 days supply | Qty: 90 | Fill #0 | Status: AC

## 2020-11-22 ENCOUNTER — Ambulatory Visit: Payer: No Typology Code available for payment source | Attending: Internal Medicine

## 2020-11-22 ENCOUNTER — Other Ambulatory Visit: Payer: Self-pay

## 2020-11-22 DIAGNOSIS — Z23 Encounter for immunization: Secondary | ICD-10-CM

## 2020-11-22 MED ORDER — CARESTART COVID-19 HOME TEST VI KIT
PACK | 0 refills | Status: DC
  Filled 2020-11-22: qty 2, 4d supply, fill #0

## 2020-11-22 MED ORDER — PFIZER COVID-19 VAC BIVALENT 30 MCG/0.3ML IM SUSP
0.3000 mL | Freq: Once | INTRAMUSCULAR | 0 refills | Status: AC
  Filled 2020-11-22: qty 0.3, 1d supply, fill #0

## 2020-11-22 NOTE — Progress Notes (Signed)
   Covid-19 Vaccination Clinic  Name:  Patricia Crane    MRN: [id number] DOB: 11-07-1970  11/22/2020  Patricia Crane was observed post Covid-19 immunization for 15 minutes without incident. She was provided with Vaccine Information Sheet and instruction to access the V-Safe system.   Patricia Crane was instructed to call 911 with any severe reactions post vaccine: Difficulty breathing  Swelling of face and throat  A fast heartbeat  A bad rash all over body  Dizziness and weakness   Lu Duffel, PharmD, MBA Clinical Acute Care Pharmacist

## 2020-11-25 ENCOUNTER — Ambulatory Visit: Payer: No Typology Code available for payment source

## 2021-01-18 ENCOUNTER — Other Ambulatory Visit: Payer: Self-pay | Admitting: Pharmacist

## 2021-01-18 ENCOUNTER — Other Ambulatory Visit: Payer: Self-pay

## 2021-01-19 ENCOUNTER — Other Ambulatory Visit: Payer: Self-pay

## 2021-01-19 MED ORDER — CARESTART COVID-19 HOME TEST VI KIT
PACK | 0 refills | Status: DC
  Filled 2021-01-19: qty 2, 4d supply, fill #0

## 2021-01-25 ENCOUNTER — Telehealth: Payer: Self-pay | Admitting: Internal Medicine

## 2021-01-29 ENCOUNTER — Other Ambulatory Visit: Payer: Self-pay | Admitting: Oncology

## 2021-01-30 MED ORDER — TAMOXIFEN CITRATE 20 MG PO TABS
ORAL_TABLET | ORAL | 0 refills | Status: DC
  Filled 2021-02-07: qty 90, 90d supply, fill #0

## 2021-02-06 ENCOUNTER — Other Ambulatory Visit: Payer: Self-pay

## 2021-02-06 MED ORDER — IBUPROFEN 600 MG PO TABS
ORAL_TABLET | ORAL | 1 refills | Status: DC
  Filled 2021-02-06: qty 20, 5d supply, fill #0

## 2021-02-06 MED ORDER — HYDROCODONE-ACETAMINOPHEN 7.5-325 MG PO TABS
ORAL_TABLET | ORAL | 0 refills | Status: DC
  Filled 2021-02-06: qty 6, 1d supply, fill #0

## 2021-02-07 ENCOUNTER — Other Ambulatory Visit: Payer: Self-pay

## 2021-02-16 ENCOUNTER — Other Ambulatory Visit: Payer: Self-pay

## 2021-02-16 ENCOUNTER — Other Ambulatory Visit (INDEPENDENT_AMBULATORY_CARE_PROVIDER_SITE_OTHER): Payer: No Typology Code available for payment source

## 2021-02-16 DIAGNOSIS — E1169 Type 2 diabetes mellitus with other specified complication: Secondary | ICD-10-CM

## 2021-02-16 DIAGNOSIS — Z114 Encounter for screening for human immunodeficiency virus [HIV]: Secondary | ICD-10-CM | POA: Diagnosis not present

## 2021-02-16 DIAGNOSIS — E119 Type 2 diabetes mellitus without complications: Secondary | ICD-10-CM

## 2021-02-16 DIAGNOSIS — Z1159 Encounter for screening for other viral diseases: Secondary | ICD-10-CM

## 2021-02-16 LAB — HEMOGLOBIN A1C: Hgb A1c MFr Bld: 6.2 % (ref 4.6–6.5)

## 2021-02-16 NOTE — Addendum Note (Signed)
Addended by: Leeanne Rio on: 02/16/2021 09:51 AM   Modules accepted: Orders

## 2021-02-17 LAB — LIPID PANEL
Cholesterol: 155 mg/dL (ref <200)
HDL: 63 mg/dL (ref >=50)
LDL Cholesterol (Calc): 77 mg/dL (calc)
Non-HDL Cholesterol (Calc): 92 mg/dL (calc) (ref <130)
Total CHOL/HDL Ratio: 2.5 (calc) (ref <5.0)
Triglycerides: 74 mg/dL (ref <150)

## 2021-02-17 LAB — COMPREHENSIVE METABOLIC PANEL
AG Ratio: 1.9 (calc) (ref 1.0–2.5)
ALT: 12 U/L (ref 6–29)
AST: 14 U/L (ref 10–35)
Albumin: 4.2 g/dL (ref 3.6–5.1)
Alkaline phosphatase (APISO): 29 U/L — ABNORMAL LOW (ref 37–153)
BUN: 12 mg/dL (ref 7–25)
CO2: 26 mmol/L (ref 20–32)
Calcium: 9 mg/dL (ref 8.6–10.4)
Chloride: 105 mmol/L (ref 98–110)
Creat: 0.71 mg/dL (ref 0.50–1.03)
Globulin: 2.2 g/dL (calc) (ref 1.9–3.7)
Glucose, Bld: 96 mg/dL (ref 65–99)
Potassium: 4.3 mmol/L (ref 3.5–5.3)
Sodium: 140 mmol/L (ref 135–146)
Total Bilirubin: 0.3 mg/dL (ref 0.2–1.2)
Total Protein: 6.4 g/dL (ref 6.1–8.1)

## 2021-02-17 LAB — HIV ANTIBODY (ROUTINE TESTING W REFLEX): HIV 1&2 Ab, 4th Generation: NONREACTIVE

## 2021-02-17 LAB — HEPATITIS C ANTIBODY
Hepatitis C Ab: NONREACTIVE
SIGNAL TO CUT-OFF: 0.02 (ref <1.00)

## 2021-02-23 ENCOUNTER — Ambulatory Visit (INDEPENDENT_AMBULATORY_CARE_PROVIDER_SITE_OTHER): Payer: No Typology Code available for payment source | Admitting: Obstetrics and Gynecology

## 2021-02-23 ENCOUNTER — Other Ambulatory Visit: Payer: Self-pay

## 2021-02-23 ENCOUNTER — Encounter: Payer: Self-pay | Admitting: Oncology

## 2021-02-23 ENCOUNTER — Encounter: Payer: Self-pay | Admitting: Obstetrics and Gynecology

## 2021-02-23 ENCOUNTER — Other Ambulatory Visit (HOSPITAL_COMMUNITY)
Admission: RE | Admit: 2021-02-23 | Discharge: 2021-02-23 | Disposition: A | Discharge status: Home / Self Care | Payer: No Typology Code available for payment source | Source: Ambulatory Visit | Attending: Obstetrics and Gynecology | Admitting: Obstetrics and Gynecology

## 2021-02-23 VITALS — BP 109/58 | HR 70 | Ht 60.0 in | Wt 114.3 lb

## 2021-02-23 DIAGNOSIS — Z9229 Personal history of other drug therapy: Secondary | ICD-10-CM | POA: Diagnosis not present

## 2021-02-23 DIAGNOSIS — R87615 Unsatisfactory cytologic smear of cervix: Secondary | ICD-10-CM | POA: Diagnosis not present

## 2021-02-23 DIAGNOSIS — Z853 Personal history of malignant neoplasm of breast: Secondary | ICD-10-CM

## 2021-02-23 DIAGNOSIS — Z0000 Encounter for general adult medical examination without abnormal findings: Secondary | ICD-10-CM

## 2021-02-23 DIAGNOSIS — N952 Postmenopausal atrophic vaginitis: Secondary | ICD-10-CM | POA: Diagnosis not present

## 2021-02-23 DIAGNOSIS — M810 Age-related osteoporosis without current pathological fracture: Secondary | ICD-10-CM

## 2021-02-23 NOTE — Progress Notes (Signed)
ANNUAL PREVENTATIVE CARE GYNECOLOGY  ENCOUNTER NOTE  Subjective:       Patricia Crane is a 51 y.o. P0 female here to establish care. She was referred by her PCP, Dr. Deborra Medina. Virl Son Vickii Chafe") has a history of left breast cancer s/p lumpectomy and radiation, currently on Tamoxifen therapy for the past year (previously was on an aromatase inhibitor).  Notes that she has not been seen by a GYN probably in the past 12 years. The patient is sexually active but notes that it is uncomfortable due to vaginal dryness. The patient has never been on hormone replacement therapy. Patient denies post-menopausal vaginal bleeding.    Current complaints: 1.  Vaginal discomfort and dryness.  Currently using an OTC cream/moisturizer? 2.  Notes one incident of brown dishcarge x 1-2 days several weeks ago.  Has resolved.    Gynecologic History Patient's last menstrual period was 12/17/2012. Contraception: post menopausal status Last Pap: 09/30/2019. Results were: negative except transformation zone component unable to be evaluated due to atrophy Last mammogram: 07/20/2020. Results were: normal Last Colonoscopy: 12/11/2017, normal except for internal hemorrhoids Last Dexa Scan: 06/26/2019, abnormal. Osteoporosis, T score -3.1.    Obstetric History OB History  Gravida Para Term Preterm AB Living  0 0 0 0 0 0  SAB IAB Ectopic Multiple Live Births  0 0 0 0 0    Past Medical History:  Diagnosis Date   Basal cell carcinoma of cheek 05/19/2013   Removed by Anne Fu    Breast cancer [facility]) 2014    breast invasive mammary carcinoma   Chicken pox    Constipation 05/19/2013   Exposure to COVID-19 virus 08/06/2018   Heart murmur    Personal history of radiation therapy 2014   left breast ca   Shingles    Skin cancer    Thyroid disease     Family History  Problem Relation Age of Onset   Hyperlipidemia Mother    Heart disease Mother    Dementia Mother 87       alzheimers   Cancer Father         tonsil ca   Hyperlipidemia Sister    Hyperlipidemia Brother    Heart disease Maternal Aunt    Hyperlipidemia Maternal Aunt    Hypertension Maternal Aunt    Heart disease Maternal Uncle    Hyperlipidemia Maternal Uncle    Stroke Maternal Uncle    Hypertension Maternal Uncle    Cancer Paternal Aunt 73       ovarian ca   Drug abuse Paternal Aunt    Cancer Paternal Uncle        lung CA ,  smoker   Breast cancer Neg Hx     Past Surgical History:  Procedure Laterality Date   BREAST BIOPSY Left 06/2012   invasive mammary   BREAST BIOPSY Right 04/22/2013   neg- core   BREAST LUMPECTOMY Left 08/18/2012   invasive mammary carcinoma. Margins close < 0.66m but negative, LN negative   COLONOSCOPY WITH PROPOFOL N/A 12/11/2017   Procedure: COLONOSCOPY WITH PROPOFOL;  Surgeon: AJonathon Bellows MD;  [facility];  Service: Gastroenterology;  Laterality: N/A;   HEMORRHOID BANDING     lasix Bilateral     Social History   Socioeconomic History   Marital status: Married    Spouse name: Not on file   Number of children: 0   Years of education: Not on file   Highest education level: Not on file  Occupational  History   Not on file  Tobacco Use   Smoking status: Former    Packs/day: 2.00    Types: Cigarettes    Quit date: 06/10/2003    Years since quitting: 17.7   Smokeless tobacco: Never   Tobacco comments:    social smoker  Vaping Use   Vaping Use: Never used  Substance and Sexual Activity   Alcohol use: Yes    Comment: occasionally   Drug use: No   Sexual activity: Not on file  Other Topics Concern   Not on file  Social History Narrative   Not on file   Social Determinants of Health   Financial Resource Strain: Not on file  Food Insecurity: Not on file  Transportation Needs: Not on file  Physical Activity: Not on file  Stress: Not on file  Social Connections: Not on file  Intimate Partner Violence: Not on file    Current Outpatient Medications on File Prior  to Visit  Medication Sig Dispense Refill   calcium carbonate (OS-CAL) 600 MG TABS tablet Take 600 mg by mouth 2 (two) times daily with a meal.     Cholecalciferol 50 MCG (2000 UT) CAPS Take by mouth daily.     COVID-19 At Home Antigen Test [facility] COVID-19 HOME TEST) KIT Use as directed 2 kit 0   Cranberry-Vitamin C (CRANBERRY CONCENTRATE/VITAMINC) 15000-100 MG CAPS Take 1 capsule by mouth daily.     Melatonin-Pyridoxine 3-1 MG TABS Take 3 mg by mouth as needed.      Multiple Vitamins-Minerals (MULTIVITAMIN WITH MINERALS) tablet Take 1 tablet by mouth daily.     Probiotic Product (ALIGN) 4 MG CAPS Take 4 mg by mouth daily.     rosuvastatin (CRESTOR) 5 MG tablet Take 1 tablet (5 mg total) by mouth 2 (two) times a week. 24 tablet 3   tamoxifen (NOLVADEX) 20 MG tablet TAKE 1 TABLET BY MOUTH ONCE DAILY 90 tablet 0   HYDROcodone-acetaminophen (NORCO) 7.5-325 MG tablet Take 1 tablet by mouth every 4 to 6 hours as needed for pain 6 tablet 0   ibuprofen (ADVIL) 600 MG tablet Take 1 tablet by mouth every 6 hours for 5 days after surgery 20 tablet 1   No current facility-administered medications on file prior to visit.    No Known Allergies    Review of Systems ROS Review of Systems - General ROS: negative for - chills, fatigue, fever, hot flashes, night sweats, weight gain or weight loss Psychological ROS: negative for - anxiety, decreased libido, depression, mood swings, physical abuse or sexual abuse Ophthalmic ROS: negative for - blurry vision, eye pain or loss of vision ENT ROS: negative for - headaches, hearing change, visual changes or vocal changes Allergy and Immunology ROS: negative for - hives, itchy/watery eyes or seasonal allergies Hematological and Lymphatic ROS: negative for - bleeding problems, bruising, swollen lymph nodes or weight loss Endocrine ROS: negative for - galactorrhea, hair pattern changes, hot flashes, malaise/lethargy, mood swings, palpitations,  polydipsia/polyuria, skin changes, temperature intolerance or unexpected weight changes Breast ROS: negative for - new or changing breast lumps or nipple discharge Respiratory ROS: negative for - cough or shortness of breath Cardiovascular ROS: negative for - chest pain, irregular heartbeat, palpitations or shortness of breath Gastrointestinal ROS: no abdominal pain, change in bowel habits, or black or bloody stools Genito-Urinary ROS: no dysuria, trouble voiding, or hematuria. Positive for vaginal dryness Musculoskeletal ROS: negative for - joint pain or joint stiffness Neurological ROS: negative for - bowel and  bladder control changes Dermatological ROS: negative for rash and skin lesion changes   Objective:   BP (!) 109/58    Pulse 70    Ht 5' (1.524 m)    Wt 114 lb 4.8 oz (51.8 kg)    LMP 12/17/2012    BMI 22.32 kg/m  CONSTITUTIONAL: Well-developed, well-nourished female in no acute distress.  PSYCHIATRIC: Normal mood and affect. Normal behavior. Normal judgment and thought content. [place]: Alert and oriented to person, place, and time. Normal muscle tone coordination. No cranial nerve deficit noted. HENT:  Normocephalic, atraumatic, External right and left ear normal. Oropharynx is clear and moist EYES: Conjunctivae and EOM are normal. Pupils are equal, round, and reactive to light. No scleral icterus.  NECK: Normal range of motion, supple, no masses.  Normal thyroid.  SKIN: Skin is warm and dry. No rash noted. Not diaphoretic. No erythema. No pallor. CARDIOVASCULAR: Normal heart rate noted, regular rhythm, no murmur. RESPIRATORY: Clear to auscultation bilaterally. Effort and breath sounds normal, no problems with respiration noted. BREASTS: Symmetric in size. No masses, skin changes, nipple drainage, or lymphadenopathy.  Well healed scar on left breast.  ABDOMEN: Soft, normal bowel sounds, no distention noted.  No tenderness, rebound or guarding.  BLADDER: Normal PELVIC:  Bladder  no bladder distension noted  Urethra: normal appearing urethra with no masses, tenderness or lesions  Vulva: normal appearing vulva with no masses, tenderness or lesions  Vagina: atrophic (moderate). No lesions or discharge.   Cervix: normal appearing cervix without discharge or lesions, slight flushing  Uterus: uterus is normal size, shape, consistency and nontender  Adnexa: normal adnexa in size, nontender and no masses  RV: External Exam NormaI  MUSCULOSKELETAL: Normal range of motion. No tenderness.  No cyanosis, clubbing, or edema.  2+ distal pulses. LYMPHATIC: No Axillary, Supraclavicular, or Inguinal Adenopathy.   Labs: Lab Results  Component Value Date   WBC 5.6 10/25/2020   HGB 12.7 10/25/2020   HCT 38.0 10/25/2020   MCV 95.1 10/25/2020   PLT 218.0 10/25/2020    Lab Results  Component Value Date   CREATININE 0.71 02/16/2021   BUN 12 02/16/2021   NA 140 02/16/2021   K 4.3 02/16/2021   CL 105 02/16/2021   CO2 26 02/16/2021    Lab Results  Component Value Date   ALT 12 02/16/2021   AST 14 02/16/2021   ALKPHOS 24 (L) 10/25/2020   BILITOT 0.3 02/16/2021    Lab Results  Component Value Date   CHOL 155 02/16/2021   HDL 63 02/16/2021   LDLCALC 77 02/16/2021   TRIG 74 02/16/2021   CHOLHDL 2.5 02/16/2021    Lab Results  Component Value Date   TSH 1.29 10/25/2020    Lab Results  Component Value Date   HGBA1C 6.2 02/16/2021     Assessment:   1. Encounter for medical examination to establish care   2. Vaginal atrophy   3. Personal history of breast cancer   4. H/O tamoxifen therapy   5. Pap smear of cervix unsatisfactory   6. Osteoporosis without current pathological fracture, unspecified osteoporosis type     Plan:  - Pap: Pap Co Test repeated today as no transformation zone cells present on last pap. - Mammogram:  up to date. Continue routine screening.  Followed by Oncology for h/o breast cancer.  - Stool Guaiac Testing:  Not Indicated. Patient  up to date with colonoscopy.  - Labs:  None ordered.  - Routine preventative health maintenance measures emphasized:  Exercise/Diet/Weight control, Tobacco Warnings, Alcohol/Substance use risks, and Stress Management - Continue Calcium and Vitamin D for osteoporosis.  - Tamoxifen therapy, advised on need to f/u for any abnormal uterine bleeding.  - Vaginal atrophy, discussed treatment options, hormonal and non-hormonal. Patient desires to think over her options. Will continue OTC treatments for now.  - Return to [place], or sooner as needed.    A total of 40 minutes were spent face-to-face with the patient during the encounter with greater than 50% dealing with counseling and coordination of care.      Rubie Maid, MD [facility]

## 2021-02-23 NOTE — Patient Instructions (Signed)
Atrophic Vaginitis Atrophic vaginitis is a condition in which the tissues that line the vagina become dry and thin. This condition is most common in women who have stopped having regular menstrual periods (are in menopause). This usually starts when a woman is 45 to 53 years old. That is the time when a woman's estrogen levels begin to decrease. Estrogen is a female hormone. It helps to keep the tissues of the vagina moist. It stimulates the vagina to produce a clear fluid that lubricates the vagina for sex. This fluid also protects the vagina from infection. Lack of estrogen can cause the lining of the vagina to get thinner and dryer. The vagina may also shrink in size. It may become less elastic. Atrophic vaginitis tends to get worse over time as a woman's estrogen level drops. What are the causes? This condition is caused by the normal drop in estrogen that happens around the time of menopause. What increases the risk? Certain conditions or situations may lower a woman's estrogen level, leading to a higher risk for atrophic vaginitis. You are more likely to develop this condition if: You are taking medicines that block estrogen. You have had your ovaries removed. You are being treated for cancer with radiation or medicines (chemotherapy). You have given birth or are breastfeeding. You are older than age 50. You smoke. What are the signs or symptoms? Symptoms of this condition include: Pain, soreness, a feeling of pressure, or bleeding during sex (dyspareunia). Vaginal burning, irritation, or itching. Pain or bleeding when a speculum is used in a vaginal exam. Having burning pain while urinating. Vaginal discharge. In some cases, there are no symptoms. How is this diagnosed? This condition is diagnosed based on your medical history and a physical exam. This will include a pelvic exam that checks the vaginal tissues. Though rare, you may also have other tests, including: A urine test. A test  that checks the acid balance in your vagina (acid balance test). How is this treated? Treatment for this condition depends on how severe your symptoms are. Treatment may include: Using an over-the-counter vaginal lubricant before sex. Using a long-acting vaginal moisturizer. Using low-dose estrogen for moderate to severe symptoms that do not respond to other treatments. Options include creams, tablets, and inserts (vaginal rings). Before you use a vaginal estrogen, tell your health care provider if you have a history of: Breast cancer. Endometrial cancer. Blood clots. If you are not sexually active and your symptoms are very mild, you may not need treatment. Follow these instructions at home: Medicines Take over-the-counter and prescription medicines only as told by your health care provider. Do not use herbal or alternative medicines unless your health care provider says that you can. Use over-the-counter creams, lubricants, or moisturizers for dryness only as told by your health care provider. General instructions If your atrophic vaginitis is caused by menopause, discuss all of your menopause symptoms and treatment options with your health care provider. Do not douche. Do not use products that can make your vagina dry. These include: Scented feminine sprays. Scented tampons. Scented soaps. Vaginal sex can help to improve blood flow and elasticity of vaginal tissue. If you choose to have sex and it hurts, try using a water-soluble lubricant or moisturizer right before having sex. Contact a health care provider if: Your discharge looks different than normal. Your vagina has an unusual smell. You have new symptoms. Your symptoms do not improve with treatment. Your symptoms get worse. Summary Atrophic vaginitis is a condition in   which the tissues that line the vagina become dry and thin. It is most common in women who have stopped having regular menstrual periods (are in  menopause). Treatment options include using vaginal lubricants and low-dose vaginal estrogen. Contact a health care provider if your vagina has an unusual smell, or if your symptoms get worse or do not improve after treatment. This information is not intended to replace advice given to you by your health care provider. Make sure you discuss any questions you have with your health care provider. Document Revised: 08/06/2019 Document Reviewed: 08/06/2019 Elsevier Patient Education  2022 Elsevier Inc.  

## 2021-03-01 LAB — CYTOLOGY - PAP: Diagnosis: NEGATIVE

## 2021-03-07 NOTE — Telephone Encounter (Signed)
error 

## 2021-04-11 ENCOUNTER — Other Ambulatory Visit: Payer: Self-pay | Admitting: Pharmacist

## 2021-04-11 ENCOUNTER — Other Ambulatory Visit: Payer: Self-pay

## 2021-04-11 MED ORDER — CARESTART COVID-19 HOME TEST VI KIT
PACK | 0 refills | Status: DC
  Filled 2021-04-11: qty 2, 4d supply, fill #0

## 2021-05-02 ENCOUNTER — Telehealth: Payer: Self-pay | Admitting: Internal Medicine

## 2021-05-02 NOTE — Telephone Encounter (Signed)
Looks like patient was receiving Prolia through OB GYN okay to start process for here? ?

## 2021-05-02 NOTE — Telephone Encounter (Signed)
Pt need prior authorization for a prolia shot that she is getting done on 3/21 ?

## 2021-05-04 NOTE — Telephone Encounter (Signed)
[place] Care Key: [id number] - PA Case ID: [id number] ?

## 2021-05-04 NOTE — Telephone Encounter (Signed)
Appointment scheduled.

## 2021-05-04 NOTE — Telephone Encounter (Signed)
Approval process started through [facility]. ?

## 2021-05-09 ENCOUNTER — Other Ambulatory Visit: Payer: Self-pay

## 2021-05-09 ENCOUNTER — Encounter: Payer: Self-pay | Admitting: Internal Medicine

## 2021-05-09 ENCOUNTER — Ambulatory Visit (INDEPENDENT_AMBULATORY_CARE_PROVIDER_SITE_OTHER): Payer: No Typology Code available for payment source | Admitting: Internal Medicine

## 2021-05-09 ENCOUNTER — Telehealth: Payer: Self-pay | Admitting: *Deleted

## 2021-05-09 VITALS — BP 108/58 | HR 87 | Temp 98.0°F | Ht 60.0 in | Wt 112.6 lb

## 2021-05-09 DIAGNOSIS — E559 Vitamin D deficiency, unspecified: Secondary | ICD-10-CM

## 2021-05-09 DIAGNOSIS — Z8639 Personal history of other endocrine, nutritional and metabolic disease: Secondary | ICD-10-CM

## 2021-05-09 DIAGNOSIS — Z853 Personal history of malignant neoplasm of breast: Secondary | ICD-10-CM

## 2021-05-09 DIAGNOSIS — M818 Other osteoporosis without current pathological fracture: Secondary | ICD-10-CM

## 2021-05-09 DIAGNOSIS — E1169 Type 2 diabetes mellitus with other specified complication: Secondary | ICD-10-CM | POA: Diagnosis not present

## 2021-05-09 DIAGNOSIS — Z23 Encounter for immunization: Secondary | ICD-10-CM

## 2021-05-09 DIAGNOSIS — Z1231 Encounter for screening mammogram for malignant neoplasm of breast: Secondary | ICD-10-CM

## 2021-05-09 DIAGNOSIS — E785 Hyperlipidemia, unspecified: Secondary | ICD-10-CM

## 2021-05-09 DIAGNOSIS — E119 Type 2 diabetes mellitus without complications: Secondary | ICD-10-CM

## 2021-05-09 MED ORDER — ZOSTER VAC RECOMB ADJUVANTED 50 MCG/0.5ML IM SUSR: 0.5000 mL | Freq: Once | INTRAMUSCULAR | 1 refills | Status: DC

## 2021-05-09 MED ORDER — ROSUVASTATIN CALCIUM 5 MG PO TABS
5.0000 mg | ORAL_TABLET | ORAL | 3 refills | Status: DC
  Filled 2021-05-09 – 2021-06-22 (×2): qty 24, 84d supply, fill #0

## 2021-05-09 MED ORDER — ZOSTER VAC RECOMB ADJUVANTED 50 MCG/0.5ML IM SUSR: 0.5000 mL | Freq: Once | INTRAMUSCULAR | 1 refills | Status: AC

## 2021-05-09 NOTE — Progress Notes (Signed)
? ?Subjective:  ?Patient ID: Patricia Crane, female    DOB: 1970/07/08  Age: 51 y.o. MRN: [id number] ? ?CC: The primary encounter diagnosis was Diabetes mellitus type 2 in nonobese [facility]). Diagnoses of Hyperlipidemia associated with type 2 diabetes mellitus ([place]), Vitamin D deficiency, History of Graves' disease, Other osteoporosis without current pathological fracture, Breast cancer screening by mammogram, Personal history of breast cancer, and Need for shingles vaccine were also pertinent to this visit. ? ? ?This visit occurred during the SARS-CoV-2 public health emergency.  Safety protocols were in place, including screening questions prior to the visit, additional usage of staff PPE, and extensive cleaning of exam room while observing appropriate contact time as indicated for disinfecting solutions.   ? ?HPI ?Patricia Crane presents for follow up on multiple issues  ?Chief Complaint  ?Patient presents with  ? Follow-up  ?  Follow up on diabetes, hyperlipidemia  ? ?1) T2DM: last a1c 6.2 Dec 29  .  Gained a few lbs during recent trip to visit her mother in [place].  Not checking sugars or taking medicatios .  ? ? ?2) HLD:  TAKING  CRESTOR 5 MG DAILY  ? ?3) h/o Breast cancer :  due for mammogram  in June  ? ?4) osteoporosis   took one dose of prolia only over one year ago.  ,  due to multiple oral surgeries and advise from heme/onc to suspend 12 had one dose due to oral surgery  last DEXA 2021  will order  ? ?5) wants thyroid and vitamin D levels..  feels eyes are bulging   history of  Grave's .  Has dry eye  on left and early cataract ? ? ?Outpatient Medications Prior to Visit  ?Medication Sig Dispense Refill  ? calcium carbonate (OS-CAL) 600 MG TABS tablet Take 600 mg by mouth 2 (two) times daily with a meal.    ? Cholecalciferol 50 MCG (2000 UT) CAPS Take by mouth daily.    ? Cranberry-Vitamin C (CRANBERRY CONCENTRATE/VITAMINC) 15000-100 MG CAPS Take 1 capsule by mouth daily.    ? Melatonin-Pyridoxine 3-1 MG TABS  Take 3 mg by mouth as needed.     ? Multiple Vitamins-Minerals (MULTIVITAMIN WITH MINERALS) tablet Take 1 tablet by mouth daily.    ? Probiotic Product (ALIGN) 4 MG CAPS Take 4 mg by mouth daily.    ? tamoxifen (NOLVADEX) 20 MG tablet TAKE 1 TABLET BY MOUTH ONCE DAILY 90 tablet 0  ? rosuvastatin (CRESTOR) 5 MG tablet Take 1 tablet (5 mg total) by mouth 2 (two) times a week. 24 tablet 3  ? COVID-19 At Home Antigen Test [facility] COVID-19 HOME TEST) KIT Use as directed 2 kit 0  ? ?No facility-administered medications prior to visit.  ? ? ?Review of Systems; ? ?Patient denies headache, fevers, malaise, unintentional weight loss, skin rash, eye pain, sinus congestion and sinus pain, sore throat, dysphagia,  hemoptysis , cough, dyspnea, wheezing, chest pain, palpitations, orthopnea, edema, abdominal pain, nausea, melena, diarrhea, constipation, flank pain, dysuria, hematuria, urinary  Frequency, nocturia, numbness, tingling, seizures,  Focal weakness, Loss of consciousness,  Tremor, insomnia, depression, anxiety, and suicidal ideation.   ? ? ? ?Objective:  ?BP (!) 108/58 (BP Location: Left Arm, Patient Position: Sitting, Cuff Size: Normal)   Pulse 87   Temp 98 ?F (36.7 ?C) (Oral)   Ht 5' (1.524 m)   Wt 112 lb 9.6 oz (51.1 kg)   LMP 12/17/2012   SpO2 98%   BMI 21.99 kg/m?  ? ?  BP Readings from Last 3 Encounters:  ?05/09/21 (!) 108/58  ?02/23/21 (!) 109/58  ?10/31/20 108/62  ? ? ?Wt Readings from Last 3 Encounters:  ?05/09/21 112 lb 9.6 oz (51.1 kg)  ?02/23/21 114 lb 4.8 oz (51.8 kg)  ?10/31/20 108 lb 3.2 oz (49.1 kg)  ? ? ?General appearance: alert, cooperative and appears stated age ?Ears: normal TM's and external ear canals both ears ?Throat: lips, mucosa, and tongue normal; teeth and gums normal ?Neck: no adenopathy, no carotid bruit, supple, symmetrical, trachea midline and thyroid not enlarged, symmetric, no tenderness/mass/nodules ?Back: symmetric, no curvature. ROM normal. No CVA tenderness. ?Lungs: clear to  auscultation bilaterally ?Heart: regular rate and rhythm, S1, S2 normal, no murmur, click, rub or gallop ?Abdomen: soft, non-tender; bowel sounds normal; no masses,  no organomegaly ?Pulses: 2+ and symmetric ?Skin: Skin color, texture, turgor normal. No rashes or lesions ?Lymph nodes: Cervical, supraclavicular, and axillary nodes normal. ? ?Lab Results  ?Component Value Date  ? HGBA1C 6.2 02/16/2021  ? HGBA1C 6.3 10/25/2020  ? HGBA1C 6.2 04/01/2020  ? ? ?Lab Results  ?Component Value Date  ? CREATININE 0.71 02/16/2021  ? CREATININE 0.70 10/25/2020  ? CREATININE 0.67 09/16/2020  ? ? ?Lab Results  ?Component Value Date  ? WBC 5.6 10/25/2020  ? HGB 12.7 10/25/2020  ? HCT 38.0 10/25/2020  ? PLT 218.0 10/25/2020  ? GLUCOSE 96 02/16/2021  ? CHOL 155 02/16/2021  ? TRIG 74 02/16/2021  ? HDL 63 02/16/2021  ? [place] 77 02/16/2021  ? ALT 12 02/16/2021  ? AST 14 02/16/2021  ? NA 140 02/16/2021  ? K 4.3 02/16/2021  ? CL 105 02/16/2021  ? CREATININE 0.71 02/16/2021  ? BUN 12 02/16/2021  ? CO2 26 02/16/2021  ? TSH 1.29 10/25/2020  ? HGBA1C 6.2 02/16/2021  ? MICROALBUR <0.7 10/25/2020  ? ? ?No results found. ? ?Assessment & Plan:  ? ?Problem List Items Addressed This Visit   ? ? Diabetes mellitus type 2 in nonobese [facility]) - Primary  ? Relevant Medications  ? rosuvastatin (CRESTOR) 5 MG tablet (Start on 05/11/2021)  ? Other Relevant Orders  ? Comp Met (CMET)  ? HgB A1c  ? History of Graves' disease  ?  Treated in 2009 with PTU.  Repeat TSH ordered  Was normal in Sept  ?  ?  ? Relevant Orders  ? TSH  ? Hyperlipidemia associated with type 2 diabetes mellitus ([place])  ? Relevant Medications  ? rosuvastatin (CRESTOR) 5 MG tablet (Start on 05/11/2021)  ? Other Relevant Orders  ? Direct LDL  ? Lipid Profile  ? Osteoporosis  ?  She has suspended Prolia after one dose due to concerns about increased risk of osteonecrosis of the jaw/ repeat DEXA needed.  Last T score was -3.1 ?  ?  ? Relevant Orders  ? DG Bone Density  ? Personal history of  breast cancer  ?  Screening mammogram due in June and ordered  ?  ?  ? ?Other Visit Diagnoses   ? ? Vitamin D deficiency      ? Relevant Orders  ? VITAMIN D 25 Hydroxy (Vit-D Deficiency, Fractures)  ? Breast cancer screening by mammogram      ? Relevant Orders  ? MM 3D SCREEN BREAST BILATERAL  ? Need for shingles vaccine      ? Relevant Orders  ? Varicella-zoster vaccine IM (Shingrix) (Completed)  ? ?  ? ? ?I spent 30 minutes dedicated to the care of this patient  on the date of this encounter to include pre-visit review of patient's medical history,  most recent imaging studies, Face-to-face time with the patient , and post visit ordering of testing and therapeutics.   ? ?Follow-up: Return in about 6 months (around 11/09/2021). ? ? ?[name], MD ?

## 2021-05-09 NOTE — Patient Instructions (Signed)
Your annual mammogram is due in June and your  DEXA  SCAN have been ordered.  Please call [place] to call to make your appointments  .  The phone number for [place] [facility] is  [phone]   ? ?Labs can be repeated on or after April 1 ? ?

## 2021-05-09 NOTE — Assessment & Plan Note (Signed)
Screening mammogram due in June and ordered  ?

## 2021-05-09 NOTE — Assessment & Plan Note (Signed)
She has suspended Prolia after one dose due to concerns about increased risk of osteonecrosis of the jaw/ repeat DEXA needed.  Last T score was -3.1 ?

## 2021-05-09 NOTE — Assessment & Plan Note (Signed)
Treated in 2009 with PTU.  Repeat TSH ordered  Was normal in Sept  ?

## 2021-05-10 ENCOUNTER — Other Ambulatory Visit: Payer: Self-pay

## 2021-05-10 ENCOUNTER — Other Ambulatory Visit: Payer: Self-pay | Admitting: Internal Medicine

## 2021-05-10 MED ORDER — TAMOXIFEN CITRATE 20 MG PO TABS
ORAL_TABLET | ORAL | 0 refills | Status: DC
  Filled 2021-05-10: qty 90, 90d supply, fill #0

## 2021-05-19 NOTE — Telephone Encounter (Signed)
Left message for patient to call office I need copy of denial letter from insurance to appeal for Prolia. ?

## 2021-05-19 NOTE — Telephone Encounter (Addendum)
Waiting for patient to send copy of Appeal that has detail of rejection cause. ?

## 2021-05-19 NOTE — Telephone Encounter (Signed)
Insurance has denied Prolia.  ? ? ?[place] [facility] Key: [id number] - PA Case ID: [id number] help? Call us at [phone] ?Outcome ?Deniedon March 23 ?This request has not been approved. Based on the information submitted for review, you did not meet our guideline rules for the requested drug. In order for your request to be approved, your provider would need to show that you have met the guideline rules below. The details below are written in medical language. If you have questions, please contact your provider. In some cases, the requested medication or alternatives offered may have additional approval requirements. For the treatment of osteoporosis (weak and brittle bones) in a patient who was treated for breast cancer, our guideline named DENOSUMAB (Prolia) requires the following rule(s) be met for approval:1) You have been receiving adjuvant aromatase inhibitor therapy (type of breast cancer drug) for breast cancer2) You are at high risk for fracture. Some risk factors include history of osteoporotic fracture, history of multiple recent low trauma fractures, corticosteroid use, or use of gonadotropin releasing hormone analogs such as nafarelin3) You had a trial of bisphosphonates such as Fosamax, Actonel, Boniva, Reclast, unless there is a medical reason why you cannot (contraindication)We do not have information showing you met this criteria. This is why your request is denied. Please work with your doctor to use a different medication or get [place] more information if it will allow [place] to approve this request. A written notification letter will follow with additional details. ?

## 2021-05-21 ENCOUNTER — Encounter: Payer: Self-pay | Admitting: Internal Medicine

## 2021-05-21 DIAGNOSIS — Z79810 Long term (current) use of selective estrogen receptor modulators (SERMs): Secondary | ICD-10-CM

## 2021-05-21 DIAGNOSIS — M818 Other osteoporosis without current pathological fracture: Secondary | ICD-10-CM

## 2021-05-22 NOTE — Telephone Encounter (Signed)
Faxed an appeal letter to [place] ?

## 2021-05-24 NOTE — Telephone Encounter (Signed)
noted 

## 2021-05-24 NOTE — Telephone Encounter (Signed)
I faxed an appeal for the Prolia FYI. ?

## 2021-05-25 ENCOUNTER — Encounter: Payer: Self-pay | Admitting: Internal Medicine

## 2021-05-25 DIAGNOSIS — R21 Rash and other nonspecific skin eruption: Secondary | ICD-10-CM

## 2021-05-25 DIAGNOSIS — R634 Abnormal weight loss: Secondary | ICD-10-CM

## 2021-05-25 DIAGNOSIS — B009 Herpesviral infection, unspecified: Secondary | ICD-10-CM

## 2021-05-29 NOTE — Telephone Encounter (Signed)
Referral has been pended.

## 2021-05-31 ENCOUNTER — Other Ambulatory Visit: Payer: Self-pay

## 2021-05-31 MED ORDER — DENOSUMAB 60 MG/ML ~~LOC~~ SOSY
60.0000 mg | PREFILLED_SYRINGE | SUBCUTANEOUS | 1 refills | Status: DC
  Filled 2021-05-31: qty 180, fill #0
  Filled 2021-06-01 – 2021-06-06 (×3): qty 1, 180d supply, fill #0

## 2021-05-31 NOTE — Addendum Note (Signed)
Addended by: Nanci Pina on: 05/31/2021 09:26 AM ? ? Modules accepted: Orders ? ?

## 2021-05-31 NOTE — Telephone Encounter (Signed)
Received approval for Prolia for 05/2021 until 05/2022 patient Prolia will need to be sent to out patient pharmacy [facility] , (Medication ordered) Patient will bring to office for nurse visit.  ?

## 2021-06-01 ENCOUNTER — Other Ambulatory Visit: Payer: Self-pay

## 2021-06-01 ENCOUNTER — Encounter: Payer: Self-pay | Admitting: Oncology

## 2021-06-01 ENCOUNTER — Other Ambulatory Visit (INDEPENDENT_AMBULATORY_CARE_PROVIDER_SITE_OTHER): Payer: No Typology Code available for payment source

## 2021-06-01 DIAGNOSIS — E785 Hyperlipidemia, unspecified: Secondary | ICD-10-CM | POA: Diagnosis not present

## 2021-06-01 DIAGNOSIS — E1169 Type 2 diabetes mellitus with other specified complication: Secondary | ICD-10-CM

## 2021-06-01 DIAGNOSIS — Z8639 Personal history of other endocrine, nutritional and metabolic disease: Secondary | ICD-10-CM

## 2021-06-01 DIAGNOSIS — E119 Type 2 diabetes mellitus without complications: Secondary | ICD-10-CM

## 2021-06-01 DIAGNOSIS — E559 Vitamin D deficiency, unspecified: Secondary | ICD-10-CM | POA: Diagnosis not present

## 2021-06-01 LAB — COMPREHENSIVE METABOLIC PANEL
ALT: 10 U/L (ref 0–35)
AST: 14 U/L (ref 0–37)
Albumin: 4.3 g/dL (ref 3.5–5.2)
Alkaline Phosphatase: 33 U/L — ABNORMAL LOW (ref 39–117)
BUN: 15 mg/dL (ref 6–23)
CO2: 31 mEq/L (ref 19–32)
Calcium: 8.8 mg/dL (ref 8.4–10.5)
Chloride: 101 mEq/L (ref 96–112)
Creatinine, Ser: 0.7 mg/dL (ref 0.40–1.20)
GFR: 99.16 mL/min (ref >60.00)
Glucose, Bld: 107 mg/dL — ABNORMAL HIGH (ref 70–99)
Potassium: 3.9 mEq/L (ref 3.5–5.1)
Sodium: 138 mEq/L (ref 135–145)
Total Bilirubin: 0.4 mg/dL (ref 0.2–1.2)
Total Protein: 6.6 g/dL (ref 6.0–8.3)

## 2021-06-01 LAB — TSH: TSH: 1.81 u[IU]/mL (ref 0.35–5.50)

## 2021-06-01 LAB — LIPID PANEL
Cholesterol: 166 mg/dL (ref 0–200)
HDL: 73.1 mg/dL (ref >39.00)
LDL Cholesterol: 81 mg/dL (ref 0–99)
NonHDL: 93.04
Total CHOL/HDL Ratio: 2
Triglycerides: 61 mg/dL (ref 0.0–149.0)
VLDL: 12.2 mg/dL (ref 0.0–40.0)

## 2021-06-01 LAB — HEMOGLOBIN A1C: Hgb A1c MFr Bld: 6.4 % (ref 4.6–6.5)

## 2021-06-01 LAB — VITAMIN D 25 HYDROXY (VIT D DEFICIENCY, FRACTURES): VITD: 42.33 ng/mL (ref 30.00–100.00)

## 2021-06-01 LAB — LDL CHOLESTEROL, DIRECT: Direct LDL: 82 mg/dL

## 2021-06-02 ENCOUNTER — Ambulatory Visit: Payer: No Typology Code available for payment source

## 2021-06-02 ENCOUNTER — Other Ambulatory Visit: Payer: Self-pay

## 2021-06-06 ENCOUNTER — Other Ambulatory Visit: Payer: Self-pay

## 2021-06-06 ENCOUNTER — Other Ambulatory Visit (HOSPITAL_COMMUNITY): Payer: Self-pay

## 2021-06-07 ENCOUNTER — Telehealth: Payer: Self-pay | Admitting: Pharmacist

## 2021-06-07 NOTE — Telephone Encounter (Signed)
Called patient to schedule an appointment for the [employer] [facility]. I was unable to reach the patient so I left a HIPAA-compliant message requesting that the patient return my call.   Luke Van Ausdall, PharmD, BCACP, CPP Clinical Pharmacist [facility] [phone]  

## 2021-06-08 ENCOUNTER — Ambulatory Visit: Payer: No Typology Code available for payment source | Attending: Internal Medicine | Admitting: Pharmacist

## 2021-06-08 DIAGNOSIS — Z79899 Other long term (current) drug therapy: Secondary | ICD-10-CM

## 2021-06-08 DIAGNOSIS — Z79810 Long term (current) use of selective estrogen receptor modulators (SERMs): Secondary | ICD-10-CM

## 2021-06-08 DIAGNOSIS — M818 Other osteoporosis without current pathological fracture: Secondary | ICD-10-CM

## 2021-06-08 MED ORDER — DENOSUMAB 60 MG/ML ~~LOC~~ SOSY
60.0000 mg | PREFILLED_SYRINGE | SUBCUTANEOUS | 1 refills | Status: AC
  Filled 2021-06-08 – 2021-06-09 (×2): qty 1, 180d supply, fill #0

## 2021-06-08 NOTE — Progress Notes (Signed)
? ?S: ?S:  ?Patient presents for review of their specialty medication therapy. ?  ?Patient is about to start Prolia for osteoporosis. Patient is managed by Dr. Derrel Nip for this.  ?  ?Adherence: has not started. Took one dose over 1 year ago. Stopped due to multiple oral surgeries and advise from heme/onc to suspend. Now is restarting.  ? ?Efficacy: has not yet started  ?  ?Dosing: '60mg'$  q44month ?  ?Dose adjustments: ?Renal: Monitor patients with severe impairment (CrCl <30 mL/minute or on dialysis) closely, as significant and prolonged hypocalcemia (incidence of 29% and potentially lasting weeks to months) and marked elevations of serum parathyroid hormone are serious risks in this population. Ensure adequate calcium and vitamin D intake/supplementation. ?CrCl ?30 mL/minute: No dosage adjustment necessary. ?CrCl <30 mL/minute: No dosage adjustment necessary; use in conjunction with guidance from patient's nephrology team. ?Hepatic: no dose adjustments (has not been studied) ?  ?Drug-drug interactions: none identified  ?  ?Screening: ?TB test: completed  ?Hepatitis: completed  ?  ?Monitoring: ?S/sx of infection: none  ?S/sx of hypersensitivity: none  ?S/sx of hypocalcemia/hypercalcemia: none  ?Dermatitis/skin rash: none  ?Peripheral edema: none  ?HA: none  ?GI upset: none  ?  ?Other side effects: none  ?  ?Last bone density study: 2021 ?  ? ?O: ?   ? ?Lab Results  ?Component Value Date  ? WBC 5.6 10/25/2020  ? HGB 12.7 10/25/2020  ? HCT 38.0 10/25/2020  ? MCV 95.1 10/25/2020  ? PLT 218.0 10/25/2020  ? ? ?  Chemistry   ?   ?Component Value Date/Time  ? NA 138 06/01/2021 0733  ? NA 134 (L) 05/26/2014 1506  ? K 3.9 06/01/2021 0733  ? K 3.8 05/26/2014 1506  ? CL 101 06/01/2021 0733  ? CL 102 05/26/2014 1506  ? CO2 31 06/01/2021 0733  ? CO2 26 05/26/2014 1506  ? BUN 15 06/01/2021 0733  ? BUN 17 05/26/2014 1506  ? CREATININE 0.70 06/01/2021 0733  ? CREATININE 0.71 02/16/2021 1004  ?    ?Component Value Date/Time  ? CALCIUM  8.8 06/01/2021 0733  ? CALCIUM 9.1 05/26/2014 1506  ? ALKPHOS 33 (L) 06/01/2021 0733  ? ALKPHOS 110 05/26/2014 1506  ? AST 14 06/01/2021 0733  ? AST 20 05/26/2014 1506  ? ALT 10 06/01/2021 0733  ? ALT 17 05/26/2014 1506  ? BILITOT 0.4 06/01/2021 0733  ? BILITOT 0.3 05/26/2014 1506  ?  ? ? ? ?A/P: ?1. Medication review: Patient is taking Prolia for osteoporosis. Reviewed the medication with the patient, including the following: Prolia (denosumab) is a monoclonal antibody with affinity for nuclear factor-kappa ligand (RANKL). Prolia binds to RANKL and prevents osteoclast formation, leading to decreased bone resorption and increased bone mass in osteoporosis. Patient educated on purpose, proper use, and potential adverse effects of Prolia. The most common adverse effects are hypersensitivities, peripheral edema, dermatitis/skin rash, GI upset, HA, joint pain, and infection. There is the possibility of atypical femur fracture, serum calcium disturbances, and osteonecrosis of the jaw. Patients should monitor for and report hip, thigh, or groin pain. Additionally, patients should monitor for and report jaw pain, tooth/periodontal infection, toothache, and/or gingival ulceration/erosion. Prolia exists as a solution prefilled syringe for SQ administration. Administration: Denosumab is intended for SubQ route only and should not be administered IV, IM, or intradermally. Prior to administration, bring to room temperature in original container (allow to stand ~15 to 30 minutes); do not warm by any other method. Solution may contain trace amounts  of translucent to white protein particles; do not use if cloudy, discolored (normal solution should be clear and colorless to pale yellow), or contains excessive particles or foreign matter. Avoid vigorous shaking. Administer via SubQ injection in the upper arm, upper thigh, or abdomen; should only be administered by a health care professional. No recommendations for any changes at  this time.  ? ? ?Benard Halsted, PharmD, BCACP, CPP ?Clinical Pharmacist ?[place] ?[phone] ? ?

## 2021-06-09 ENCOUNTER — Other Ambulatory Visit (HOSPITAL_COMMUNITY): Payer: Self-pay

## 2021-06-09 ENCOUNTER — Encounter: Payer: Self-pay | Admitting: Oncology

## 2021-06-12 ENCOUNTER — Other Ambulatory Visit (HOSPITAL_COMMUNITY): Payer: Self-pay

## 2021-06-13 ENCOUNTER — Ambulatory Visit (INDEPENDENT_AMBULATORY_CARE_PROVIDER_SITE_OTHER): Payer: No Typology Code available for payment source

## 2021-06-13 DIAGNOSIS — M818 Other osteoporosis without current pathological fracture: Secondary | ICD-10-CM | POA: Diagnosis not present

## 2021-06-13 MED ORDER — DENOSUMAB 60 MG/ML ~~LOC~~ SOSY
60.0000 mg | PREFILLED_SYRINGE | Freq: Once | SUBCUTANEOUS | Status: AC
  Administered 2021-06-13: 60 mg via SUBCUTANEOUS

## 2021-06-13 NOTE — Progress Notes (Signed)
Patient came in office today for Prolia injection given in right arm SQ. Patient tolerated well with no signs of distress.  ?

## 2021-06-23 ENCOUNTER — Other Ambulatory Visit: Payer: Self-pay

## 2021-07-04 NOTE — Telephone Encounter (Signed)
Viewing for Prolia ?

## 2021-08-04 ENCOUNTER — Other Ambulatory Visit: Payer: Self-pay | Admitting: Internal Medicine

## 2021-08-04 ENCOUNTER — Other Ambulatory Visit (HOSPITAL_COMMUNITY): Payer: Self-pay

## 2021-08-04 MED ORDER — TAMOXIFEN CITRATE 20 MG PO TABS
ORAL_TABLET | ORAL | 0 refills | Status: DC
  Filled 2021-08-04: qty 60, 60d supply, fill #0
  Filled 2021-08-04: qty 30, 30d supply, fill #0

## 2021-08-07 ENCOUNTER — Ambulatory Visit
Admission: RE | Admit: 2021-08-07 | Discharge: 2021-08-07 | Disposition: A | Discharge status: Home / Self Care | Payer: No Typology Code available for payment source | Source: Ambulatory Visit | Attending: Internal Medicine | Admitting: Internal Medicine

## 2021-08-07 DIAGNOSIS — Z1231 Encounter for screening mammogram for malignant neoplasm of breast: Secondary | ICD-10-CM | POA: Insufficient documentation

## 2021-08-07 DIAGNOSIS — M818 Other osteoporosis without current pathological fracture: Secondary | ICD-10-CM | POA: Insufficient documentation

## 2021-09-07 ENCOUNTER — Telehealth: Payer: Self-pay | Admitting: Internal Medicine

## 2021-09-07 DIAGNOSIS — E1169 Type 2 diabetes mellitus with other specified complication: Secondary | ICD-10-CM

## 2021-09-07 NOTE — Telephone Encounter (Signed)
Pt called in requesting labs before her physical appt... No labs in system... Pt is schedule for physical on 11/17/2021 at 2pm... Pt requesting callback

## 2021-09-08 ENCOUNTER — Other Ambulatory Visit: Payer: Self-pay

## 2021-09-08 MED ORDER — ROSUVASTATIN CALCIUM 5 MG PO TABS: 5.0000 mg | ORAL_TABLET | ORAL | 3 refills | Status: DC

## 2021-09-08 NOTE — Telephone Encounter (Signed)
Does Patient need labs before physical in September, she had a full panel in April?

## 2021-09-11 ENCOUNTER — Encounter: Payer: Self-pay | Admitting: Oncology

## 2021-09-11 NOTE — Telephone Encounter (Signed)
Signing encounter, see note 03/10/20

## 2021-09-13 NOTE — Telephone Encounter (Signed)
LMTCB to schedule a lab appointment -labs have been put in

## 2021-09-14 ENCOUNTER — Encounter: Payer: Self-pay | Admitting: Oncology

## 2021-09-14 NOTE — Telephone Encounter (Signed)
Spoke to Patient and scheduled her for labs on Tuesday 11/14/21 at 7:45. Her physical with Dr. Derrel Nip is on 11/17/21.

## 2021-09-18 ENCOUNTER — Encounter: Payer: Self-pay | Admitting: Nurse Practitioner

## 2021-09-18 ENCOUNTER — Other Ambulatory Visit: Payer: Self-pay

## 2021-09-18 ENCOUNTER — Inpatient Hospital Stay (HOSPITAL_BASED_OUTPATIENT_CLINIC_OR_DEPARTMENT_OTHER): Payer: BC Managed Care – PPO | Admitting: Nurse Practitioner

## 2021-09-18 ENCOUNTER — Inpatient Hospital Stay: Payer: BC Managed Care – PPO | Attending: Internal Medicine

## 2021-09-18 ENCOUNTER — Ambulatory Visit: Payer: No Typology Code available for payment source | Admitting: Internal Medicine

## 2021-09-18 VITALS — BP 104/67 | HR 55 | Temp 97.1°F | Resp 16 | Ht 60.0 in | Wt 114.0 lb

## 2021-09-18 DIAGNOSIS — C50812 Malignant neoplasm of overlapping sites of left female breast: Secondary | ICD-10-CM | POA: Insufficient documentation

## 2021-09-18 DIAGNOSIS — C50912 Malignant neoplasm of unspecified site of left female breast: Secondary | ICD-10-CM

## 2021-09-18 DIAGNOSIS — Z79811 Long term (current) use of aromatase inhibitors: Secondary | ICD-10-CM | POA: Diagnosis not present

## 2021-09-18 DIAGNOSIS — Z170 Estrogen receptor positive status [ER+]: Secondary | ICD-10-CM

## 2021-09-18 DIAGNOSIS — M810 Age-related osteoporosis without current pathological fracture: Secondary | ICD-10-CM | POA: Diagnosis not present

## 2021-09-18 DIAGNOSIS — Z08 Encounter for follow-up examination after completed treatment for malignant neoplasm: Secondary | ICD-10-CM | POA: Diagnosis not present

## 2021-09-18 DIAGNOSIS — Z853 Personal history of malignant neoplasm of breast: Secondary | ICD-10-CM

## 2021-09-18 DIAGNOSIS — Z87891 Personal history of nicotine dependence: Secondary | ICD-10-CM | POA: Diagnosis not present

## 2021-09-18 LAB — CBC WITH DIFFERENTIAL/PLATELET
Abs Immature Granulocytes: 0.01 10*3/uL (ref 0.00–0.07)
Basophils Absolute: 0 10*3/uL (ref 0.0–0.1)
Basophils Relative: 1 %
Eosinophils Absolute: 0.1 10*3/uL (ref 0.0–0.5)
Eosinophils Relative: 1 %
HCT: 40.1 % (ref 36.0–46.0)
Hemoglobin: 13.4 g/dL (ref 12.0–15.0)
Immature Granulocytes: 0 %
Lymphocytes Relative: 47 %
Lymphs Abs: 2.4 10*3/uL (ref 0.7–4.0)
MCH: 31.7 pg (ref 26.0–34.0)
MCHC: 33.4 g/dL (ref 30.0–36.0)
MCV: 94.8 fL (ref 80.0–100.0)
Monocytes Absolute: 0.4 10*3/uL (ref 0.1–1.0)
Monocytes Relative: 8 %
Neutro Abs: 2.2 10*3/uL (ref 1.7–7.7)
Neutrophils Relative %: 43 %
Platelets: 223 10*3/uL (ref 150–400)
RBC: 4.23 MIL/uL (ref 3.87–5.11)
RDW: 12.3 % (ref 11.5–15.5)
WBC: 5 10*3/uL (ref 4.0–10.5)
nRBC: 0 % (ref 0.0–0.2)

## 2021-09-18 LAB — COMPREHENSIVE METABOLIC PANEL
ALT: 15 U/L (ref 0–44)
AST: 20 U/L (ref 15–41)
Albumin: 4.4 g/dL (ref 3.5–5.0)
Alkaline Phosphatase: 29 U/L — ABNORMAL LOW (ref 38–126)
Anion gap: 5 (ref 5–15)
BUN: 16 mg/dL (ref 6–20)
CO2: 31 mmol/L (ref 22–32)
Calcium: 9.2 mg/dL (ref 8.9–10.3)
Chloride: 105 mmol/L (ref 98–111)
Creatinine, Ser: 0.52 mg/dL (ref 0.44–1.00)
GFR, Estimated: >60 mL/min (ref >60)
Glucose, Bld: 103 mg/dL — ABNORMAL HIGH (ref 70–99)
Potassium: 3.9 mmol/L (ref 3.5–5.1)
Sodium: 141 mmol/L (ref 135–145)
Total Bilirubin: 0.4 mg/dL (ref 0.3–1.2)
Total Protein: 7.2 g/dL (ref 6.5–8.1)

## 2021-09-18 NOTE — Progress Notes (Signed)
[place] CONSULT NOTE  Patient Care Team: [name], MD as PCP - General (Internal Medicine) Cammie Sickle, MD as Consulting Physician (Internal Medicine)  CHIEF COMPLAINTS/PURPOSE OF CONSULTATION:  Breast cancer  #  Oncology History Overview Note  51 year old female status post partial mastectomy for a stage II (T2, N0, M0) invasive mammary carcinoma ER/PR positive HER-2/neu negative by fish with Oncotype score of 24 and patient declining systemic chemotherapy. left  breast  lower inner quadrant tumor 2.patient did not want chemotherapy.  Had finished radiation therapy (October, 2014) 3.  Starting anti-hormonal therapy  Trelstarand Aromasin nov 2014]. 4.abnormal right breast mammogram(April of 2015) biopsies negative  for malignancy  -------------------------------------------------------------  Patricia Crane is a 51 y.o. female with stage IIA left breast cancer s/p partial mastectomy and sentinel lymph node biopsy on 08/18/2012.  Pathology revealed a 2.5 cm grade III invasive ductal carcinoma of the breast.  Margins were uninvolved, but close (5 mm).  DCIS was present.  One sentinel lymph node was negative.  Tumor was ER + (60%), PR + (25%) and Her2/neu 2+ (negative by FISH).  Pathologic stage was T2N0M0.     Oncotype DX score was 24 which translated to a 16% risk of distant recurrence at 10 years with tamoxifen alone (confidence interval 12-19%).  She declined systemic chemotherapy.  BCI testing on 06/11/2017 revealed a 12.7% risk of late recurrence (years 5-10) and a high likelihood of benefit.   She received radiation.  She was started on Trelstar [facility] agonist) and Aromasin in 12/2012.  She had a reaction (inflammation at the injection site, swelling and fever). She was switched to Lupron (few joint aches and headache).  Last Lupron injection was in 08/2014.  She was on Aromasin. She began Femara on 07/02/2017 secondary to costs.   She switched to  tamoxifen on 09/15/2019 secondary to osteoporosis.   Bilateral breast MRI on 12/02/2017 revealed no abnormal enhancement in either breast.  Bilateral mammogram on 07/16/2019 revealed no evidence of malignancy.   CA 27.29 was 28.2 on 11/26/2013, 23.2 on 12/01/2014,  27.6 on 10/06/2015, 24.5 on 04/05/2016, 23.3 on 10/04/2016, 21.6 on 04/23/2017, 24 on 10/24/2017, 21.8 on 05/19/2018, 27.7 on 11/18/2018, 23.9 on 07/21/2019, and 13.9 on 02/22/2020.   She has been in menopause.  Last menstrual period was 11/2012.  Estradiol was < 5 and FSH 8.2 on 01/12/2015.  Estradiol was 7.6 and FSH 7.6 on 10/06/2015.  Estradiol was 9.0 and FSH 23.9 on 04/05/2016.  Estradiol was 10.6 and FSH 26.2 on 10/04/2016.  Estradiol was < 5 and FSH 27.2 on 04/23/2017.   Bone density on 05/02/2015 revealed osteoporosis with a T-score of -2.8 in the AP spine.  Bone density on 05/21/2017 revealed osteoporosis with a T-score of -2.8 in AP spine L1-L4.  Bone density on 07/16/2019 revealed osteoporosis with a T-score of -3.1 in the AP spine L1-L4 (L3).  She is on Fosamax, calcium, and vitamin D.   Personal history of breast cancer  06/25/2012 Initial Diagnosis   Malignant neoplasm of breast, stage 2   Basal cell carcinoma of cheek (Resolved)  05/19/2013 Initial Diagnosis   Basal cell carcinoma of cheek   Carcinoma of overlapping sites of left breast in female, estrogen receptor positive ([place])  09/16/2020 Initial Diagnosis   Carcinoma of overlapping sites of left breast in female, estrogen receptor positive ([place])      HISTORY OF PRESENTING ILLNESS:  Patricia Crane 51 y.o.  female postmenopausal with history of stage  II ER/PR positive HER2 negative breast cancer currently on tamoxifen is here for follow-up.  Patient denies any worsening joint pains or hot flashes.  Denies any nausea vomiting abdominal pain.  Denies any headaches. Feels well. Is eager to complete tamoxifen.   Review of Systems  Constitutional:  Negative for  chills, diaphoresis, fever, malaise/fatigue and weight loss.  HENT:  Negative for nosebleeds and sore throat.   Eyes:  Negative for double vision.  Respiratory:  Negative for cough, hemoptysis, sputum production, shortness of breath and wheezing.   Cardiovascular:  Negative for chest pain, palpitations, orthopnea and leg swelling.  Gastrointestinal:  Negative for abdominal pain, blood in stool, constipation, diarrhea, heartburn, melena, nausea and vomiting.  Genitourinary:  Negative for dysuria, frequency and urgency.  Musculoskeletal:  Negative for back pain and joint pain.  Skin: Negative.  Negative for itching and rash.  Neurological:  Negative for dizziness, tingling, focal weakness, weakness and headaches.  Endo/Heme/Allergies:  Does not bruise/bleed easily.  Psychiatric/Behavioral:  Negative for depression. The patient is not nervous/anxious and does not have insomnia.      MEDICAL HISTORY:  Past Medical History:  Diagnosis Date   Basal cell carcinoma of cheek 05/19/2013   Removed by Caprice Beaver    Breast cancer [facility]) 2014    breast invasive mammary carcinoma   Chicken pox    Constipation 05/19/2013   Exposure to COVID-19 virus 08/06/2018   Heart murmur    Personal history of radiation therapy 2014   left breast ca   Shingles    Skin cancer    Thyroid disease     SURGICAL HISTORY: Past Surgical History:  Procedure Laterality Date   BREAST BIOPSY Left 06/2012   invasive mammary   BREAST BIOPSY Right 04/22/2013   neg- core   BREAST LUMPECTOMY Left 08/18/2012   invasive mammary carcinoma. Margins close < 0.64mm but negative, LN negative   COLONOSCOPY WITH PROPOFOL N/A 12/11/2017   Procedure: COLONOSCOPY WITH PROPOFOL;  Surgeon: Wyline Mood, MD;  Location: [facility] ENDOSCOPY;  Service: Gastroenterology;  Laterality: N/A;   HEMORRHOID BANDING     lasix Bilateral     SOCIAL HISTORY: Social History   Socioeconomic History   Marital status: Married    Spouse name: Not on  file   Number of children: 0   Years of education: Not on file   Highest education level: Not on file  Occupational History   Not on file  Tobacco Use   Smoking status: Former    Packs/day: 2.00    Types: Cigarettes    Quit date: 06/10/2003    Years since quitting: 18.2   Smokeless tobacco: Never   Tobacco comments:    social smoker  Vaping Use   Vaping Use: Never used  Substance and Sexual Activity   Alcohol use: Yes    Comment: occasionally   Drug use: No   Sexual activity: Not on file  Other Topics Concern   Not on file  Social History Narrative   Not on file   Social Determinants of Health   Financial Resource Strain: Low Risk  (12/11/2017)   Overall Financial Resource Strain (CARDIA)    Difficulty of Paying Living Expenses: Not hard at all  Food Insecurity: Unknown (12/11/2017)   Hunger Vital Sign    Worried About Running Out of Food in the Last Year: Never true    Ran Out of Food in the Last Year: Not on file  Transportation Needs: No Transportation Needs (12/11/2017)  PRAPARE - Administrator, Civil Service (Medical): No    Lack of Transportation (Non-Medical): No  Physical Activity: Not on file  Stress: Not on file  Social Connections: Not on file  Intimate Partner Violence: Not on file    FAMILY HISTORY: Family History  Problem Relation Age of Onset   Hyperlipidemia Mother    Heart disease Mother    Dementia Mother 29       alzheimers   Cancer Father        tonsil ca   Hyperlipidemia Sister    Hyperlipidemia Brother    Heart disease Maternal Aunt    Hyperlipidemia Maternal Aunt    Hypertension Maternal Aunt    Heart disease Maternal Uncle    Hyperlipidemia Maternal Uncle    Stroke Maternal Uncle    Hypertension Maternal Uncle    Cancer Paternal Aunt 60       ovarian ca   Drug abuse Paternal Aunt    Cancer Paternal Uncle        lung CA ,  smoker   Breast cancer Neg Hx     ALLERGIES:  has No Known Allergies.  MEDICATIONS:   Current Outpatient Medications  Medication Sig Dispense Refill   Biotin 58305 MCG TABS Take 10,000 mcg by mouth daily.     calcium carbonate (OS-CAL) 600 MG TABS tablet Take 600 mg by mouth 2 (two) times daily with a meal.     Cholecalciferol 50 MCG (2000 UT) CAPS Take by mouth daily.     Cranberry-Vitamin C (CRANBERRY CONCENTRATE/VITAMINC) 15000-100 MG CAPS Take 1 capsule by mouth daily.     denosumab (PROLIA) 60 MG/ML SOSY injection Inject 60 mg into the skin every 6 (six) months. 1 mL 1   Melatonin-Pyridoxine 3-1 MG TABS Take 3 mg by mouth as needed.      Multiple Vitamins-Minerals (MULTIVITAMIN WITH MINERALS) tablet Take 1 tablet by mouth daily.     Probiotic Product (ALIGN) 4 MG CAPS Take 4 mg by mouth daily.     rosuvastatin (CRESTOR) 5 MG tablet Take 1 tablet (5 mg total) by mouth 2 (two) times a week. 24 tablet 3   tamoxifen (NOLVADEX) 20 MG tablet TAKE 1 TABLET BY MOUTH ONCE DAILY 90 tablet 0   No current facility-administered medications for this visit.      Marland Kitchen  PHYSICAL EXAMINATION: ECOG PERFORMANCE STATUS: 0 - Asymptomatic  Vitals:   09/18/21 0958  BP: 104/67  Pulse: (!) 55  Resp: 16  Temp: (!) 97.1 F (36.2 C)  SpO2: 100%   Filed Weights   09/18/21 0958  Weight: 114 lb (51.7 kg)    Physical Exam Constitutional:      Appearance: She is not ill-appearing.  Eyes:     General: No scleral icterus.    Conjunctiva/sclera: Conjunctivae normal.  Cardiovascular:     Rate and Rhythm: Normal rate and regular rhythm.  Chest:     Comments: Patient declined breast exam. Performed by pcp.  Abdominal:     General: There is no distension.     Palpations: Abdomen is soft.     Tenderness: There is no abdominal tenderness. There is no guarding.  Musculoskeletal:        General: No deformity.     Right lower leg: No edema.     Left lower leg: No edema.  Lymphadenopathy:     Cervical: No cervical adenopathy.  Skin:    General: Skin is warm and dry.  Neurological:  Mental Status: She is alert and oriented to person, place, and time. Mental status is at baseline.  Psychiatric:        Mood and Affect: Mood normal.        Behavior: Behavior normal.      LABORATORY DATA:  I have reviewed the data as listed Lab Results  Component Value Date   WBC 5.6 10/25/2020   HGB 12.7 10/25/2020   HCT 38.0 10/25/2020   MCV 95.1 10/25/2020   PLT 218.0 10/25/2020   Recent Labs    10/25/20 0735 02/16/21 1004 06/01/21 0733  NA 138 140 138  K 3.9 4.3 3.9  CL 103 105 101  CO2 $Re'28 26 31  'Isq$ GLUCOSE 101* 96 107*  BUN $Re'15 12 15  'pqr$ CREATININE 0.70 0.71 0.70  CALCIUM 8.8 9.0 8.8  PROT 6.8 6.4 6.6  ALBUMIN 4.1  --  4.3  AST $Re'15 14 14  'rsj$ ALT $R'12 12 10  'zu$ ALKPHOS 24*  --  33*  BILITOT 0.4 0.3 0.4    RADIOGRAPHIC STUDIES: I have personally reviewed the radiological images as listed and agreed with the findings in the report. No results found.  ASSESSMENT & PLAN:   No problem-specific Assessment & Plan notes found for this encounter.  Carcinoma of overlapping sites of left breast in female, estrogen receptor positive ([place]) # Stage IIA left breast cancer ER/PR positive HER2 negative [IHC 2+ FISH negative] s/p partial mastectomy and sentinel lymph node biopsy on 08/18/2012.  Grade 3.  Oncotype recurrence score 24; declined chemotherapy.  Currently on extended adjuvant therapy tamoxifen [concern for osteoporosis; patient menopausal since 2014].  Mammogram [pcp] June 2023-no evidence of recurrence/normal limits. Clinically, asymptomatic.    # Continue tamoxifen at this time for 2 more years, completing in 2024.   # BMD- [May 3545] Osteoporosis T-score of -3.1- on Tamoxifen. Improved to -2.8. On prolia with PCP. No evidence of ONJ.    DISPOSITION: 1 year- lab (cbc, cmp, ca27.29), Dr. Jacinto Reap- [place]  All questions were answered. The patient knows to call the clinic with any problems, questions or concerns.   Verlon Au, NP 09/18/2021 10:20 AM

## 2021-09-19 LAB — CANCER ANTIGEN 27.29: CA 27.29: 17.8 U/mL (ref 0.0–38.6)

## 2021-10-03 ENCOUNTER — Encounter: Payer: Self-pay | Admitting: Oncology

## 2021-10-03 ENCOUNTER — Telehealth: Payer: Self-pay | Admitting: Internal Medicine

## 2021-10-03 NOTE — Telephone Encounter (Signed)
Patient called and wanted office to know she has [employer], this has changed since the last Prolia injection in April. Patient states she would like to get Prolia injection when she sees Dr Derrel Nip in October.

## 2021-10-04 ENCOUNTER — Other Ambulatory Visit: Payer: Self-pay | Admitting: *Deleted

## 2021-10-04 MED ORDER — TAMOXIFEN CITRATE 20 MG PO TABS: ORAL_TABLET | ORAL | 0 refills | Status: DC

## 2021-10-26 NOTE — Telephone Encounter (Signed)
I have resubmitted new insurance info to [employer] portal for benefit verification.

## 2021-10-31 ENCOUNTER — Encounter: Payer: No Typology Code available for payment source | Admitting: Internal Medicine

## 2021-11-06 ENCOUNTER — Telehealth: Payer: Self-pay

## 2021-11-06 NOTE — Telephone Encounter (Signed)
Patient states she received a denial for prolia from her new insurance company, [employer], and she would like to know who can help her with filing an appeal.

## 2021-11-07 ENCOUNTER — Encounter: Payer: Self-pay | Admitting: *Deleted

## 2021-11-07 NOTE — Telephone Encounter (Signed)
Sent mychart message

## 2021-11-09 NOTE — Telephone Encounter (Signed)
Can you help me with this? I am not familiar with this process that she mentioned with the Prolia support program.

## 2021-11-10 NOTE — Telephone Encounter (Signed)
Yes be glad too but most of the work is on the patient.

## 2021-11-14 ENCOUNTER — Other Ambulatory Visit: Payer: BC Managed Care – PPO

## 2021-11-17 ENCOUNTER — Encounter: Payer: No Typology Code available for payment source | Admitting: Internal Medicine

## 2021-11-20 ENCOUNTER — Other Ambulatory Visit (HOSPITAL_COMMUNITY): Payer: Self-pay

## 2021-12-01 ENCOUNTER — Other Ambulatory Visit (INDEPENDENT_AMBULATORY_CARE_PROVIDER_SITE_OTHER): Payer: BC Managed Care – PPO

## 2021-12-01 DIAGNOSIS — E1169 Type 2 diabetes mellitus with other specified complication: Secondary | ICD-10-CM | POA: Diagnosis not present

## 2021-12-01 DIAGNOSIS — Z0100 Encounter for examination of eyes and vision without abnormal findings: Secondary | ICD-10-CM | POA: Diagnosis not present

## 2021-12-01 DIAGNOSIS — E785 Hyperlipidemia, unspecified: Secondary | ICD-10-CM | POA: Diagnosis not present

## 2021-12-01 DIAGNOSIS — M3501 Sicca syndrome with keratoconjunctivitis: Secondary | ICD-10-CM | POA: Diagnosis not present

## 2021-12-01 LAB — COMPREHENSIVE METABOLIC PANEL
ALT: 15 U/L (ref 0–35)
AST: 17 U/L (ref 0–37)
Albumin: 4.4 g/dL (ref 3.5–5.2)
Alkaline Phosphatase: 29 U/L — ABNORMAL LOW (ref 39–117)
BUN: 16 mg/dL (ref 6–23)
CO2: 29 mEq/L (ref 19–32)
Calcium: 9.1 mg/dL (ref 8.4–10.5)
Chloride: 101 mEq/L (ref 96–112)
Creatinine, Ser: 0.75 mg/dL (ref 0.40–1.20)
GFR: 90.96 mL/min (ref >60.00)
Glucose, Bld: 109 mg/dL — ABNORMAL HIGH (ref 70–99)
Potassium: 4.2 mEq/L (ref 3.5–5.1)
Sodium: 138 mEq/L (ref 135–145)
Total Bilirubin: 0.4 mg/dL (ref 0.2–1.2)
Total Protein: 7 g/dL (ref 6.0–8.3)

## 2021-12-01 LAB — HM DIABETES EYE EXAM

## 2021-12-01 LAB — HEMOGLOBIN A1C: Hgb A1c MFr Bld: 6.2 % (ref 4.6–6.5)

## 2021-12-02 LAB — LIPID PANEL W/REFLEX DIRECT LDL
Cholesterol: 177 mg/dL (ref <200)
HDL: 76 mg/dL (ref >=50)
LDL Cholesterol (Calc): 87 mg/dL (calc)
Non-HDL Cholesterol (Calc): 101 mg/dL (calc) (ref <130)
Total CHOL/HDL Ratio: 2.3 (calc) (ref <5.0)
Triglycerides: 58 mg/dL (ref <150)

## 2021-12-08 ENCOUNTER — Encounter: Payer: Self-pay | Admitting: Internal Medicine

## 2021-12-08 ENCOUNTER — Ambulatory Visit (INDEPENDENT_AMBULATORY_CARE_PROVIDER_SITE_OTHER): Payer: BC Managed Care – PPO | Admitting: Internal Medicine

## 2021-12-08 VITALS — BP 116/70 | HR 91 | Temp 97.9°F | Resp 14 | Ht 60.0 in | Wt 116.2 lb

## 2021-12-08 DIAGNOSIS — N6321 Unspecified lump in the left breast, upper outer quadrant: Secondary | ICD-10-CM | POA: Diagnosis not present

## 2021-12-08 DIAGNOSIS — Z23 Encounter for immunization: Secondary | ICD-10-CM | POA: Diagnosis not present

## 2021-12-08 DIAGNOSIS — Z0001 Encounter for general adult medical examination with abnormal findings: Secondary | ICD-10-CM | POA: Diagnosis not present

## 2021-12-08 DIAGNOSIS — Z79810 Long term (current) use of selective estrogen receptor modulators (SERMs): Secondary | ICD-10-CM | POA: Diagnosis not present

## 2021-12-08 DIAGNOSIS — E119 Type 2 diabetes mellitus without complications: Secondary | ICD-10-CM

## 2021-12-08 DIAGNOSIS — M818 Other osteoporosis without current pathological fracture: Secondary | ICD-10-CM | POA: Diagnosis not present

## 2021-12-08 DIAGNOSIS — Z0000 Encounter for general adult medical examination without abnormal findings: Secondary | ICD-10-CM

## 2021-12-08 MED ORDER — ALPRAZOLAM 0.25 MG PO TABS: 0.2500 mg | ORAL_TABLET | Freq: Two times a day (BID) | ORAL | 0 refills | Status: DC | PRN

## 2021-12-08 MED ORDER — ROSUVASTATIN CALCIUM 5 MG PO TABS: 5.0000 mg | ORAL_TABLET | ORAL | 3 refills | Status: DC

## 2021-12-08 NOTE — Progress Notes (Unsigned)
The patient is here for annual preventive examination and management of other chronic and acute problems.   The risk factors are reflected in the social history.   The roster of all physicians providing medical care to patient - is listed in the Snapshot section of the chart.   Activities of daily living:  The patient is 100% independent in all ADLs: dressing, toileting, feeding as well as independent mobility   Home safety : The patient has smoke detectors in the home. They wear seatbelts.  There are no unsecured firearms at home. There is no violence in the home.    There is no risks for hepatitis, STDs or HIV. There is no   history of blood transfusion. They have no travel history to infectious disease endemic areas of the world.   The patient has seen their dentist in the last six month. They have seen their eye doctor in the last year. The patinet  denies slight hearing difficulty with regard to whispered voices and some television programs.  They have deferred audiologic testing in the last year.  They do not  have excessive sun exposure. Discussed the need for sun protection: hats, long sleeves and use of sunscreen if there is significant sun exposure.    Diet: the importance of a healthy diet is discussed. They do have a healthy diet.   The benefits of regular aerobic exercise were discussed. The patient  exercises  3 to 5 days per week  for  60 minutes.    Depression screen: there are no signs or vegative symptoms of depression- irritability, change in appetite, anhedonia, sadness/tearfullness.   The following portions of the patient's history were reviewed and updated as appropriate: allergies, current medications, past family history, past medical history,  past surgical history, past social history  and problem list.   Visual acuity was not assessed per patient preference since the patient has regular follow up with an  ophthalmologist. Hearing and body mass index were assessed and  reviewed.    During the course of the visit the patient was educated and counseled about appropriate screening and preventive services including : fall prevention , diabetes screening, nutrition counseling, colorectal cancer screening, and recommended immunizations.    Chief Complaint:   1)  Type 2 DM:  managed with diet.  Walking 4 days per week after dinner ( if eating carbs) .  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  Works 10 4 hr days.  Likes the schedule  2) went home to [place] for one month in august. Mother has late stage [place].   Review of Symptoms  Patient denies headache, fevers, malaise, unintentional weight loss, skin rash, eye pain, sinus congestion and sinus pain, sore throat, dysphagia,  hemoptysis , cough, dyspnea, wheezing, chest pain, palpitations, orthopnea, edema, abdominal pain, nausea, melena, diarrhea, constipation, flank pain, dysuria, hematuria, urinary  Frequency, nocturia, numbness, tingling, seizures,  Focal weakness, Loss of consciousness,  Tremor, insomnia, depression, anxiety, and suicidal ideation.    Physical Exam:  BP 116/70 (BP Location: Left Arm, Patient Position: Sitting, Cuff Size: Normal)   Pulse 91   Temp 97.9 F (36.6 C) (Oral)   Resp 14   Ht 5' (1.524 m)   Wt 116 lb 3.2 oz (52.7 kg)   LMP 12/03/2012 (Approximate)   SpO2 99%   BMI 22.69 kg/m    General appearance: alert, cooperative and appears stated age Head: Normocephalic, without obvious abnormality,  atraumatic Eyes: conjunctivae/corneas clear. PERRL, EOM's intact. Fundi benign. Ears: normal TM's and external ear canals both ears Nose: Nares normal. Septum midline. Mucosa normal. No drainage or sinus tenderness. Throat: lips, mucosa, and tongue normal; teeth and gums normal Neck: no adenopathy, no carotid bruit, no JVD, supple, symmetrical, trachea midline and thyroid not enlarged, symmetric, no tenderness/mass/nodules Lungs: clear to  auscultation bilaterally Breasts: normal appearance, no masses or tenderness Heart: regular rate and rhythm, S1, S2 normal, no murmur, click, rub or gallop Abdomen: soft, non-tender; bowel sounds normal; no masses,  no organomegaly Extremities: extremities normal, atraumatic, no cyanosis or edema Pulses: 2+ and symmetric Skin: Skin color, texture, turgor normal. No rashes or lesions Neurologic: Alert and oriented X 3, normal strength and tone. Normal symmetric reflexes. Normal coordination and gait.     Assessment and Plan:  No problem-specific Assessment & Plan notes found for this encounter.   Updated Medication List Outpatient Encounter Medications as of 12/08/2021  Medication Sig   Biotin 10000 MCG TABS Take 10,000 mcg by mouth daily.   calcium carbonate (OS-CAL) 600 MG TABS tablet Take 600 mg by mouth 2 (two) times daily with a meal.   Cholecalciferol 50 MCG (2000 UT) CAPS Take by mouth daily.   Cranberry-Vitamin C (CRANBERRY CONCENTRATE/VITAMINC) 15000-100 MG CAPS Take 1 capsule by mouth daily.   Melatonin-Pyridoxine 3-1 MG TABS Take 3 mg by mouth as needed.    Multiple Vitamins-Minerals (MULTIVITAMIN WITH MINERALS) tablet Take 1 tablet by mouth daily.   Probiotic Product (ALIGN) 4 MG CAPS Take 4 mg by mouth daily.   rosuvastatin (CRESTOR) 5 MG tablet Take 1 tablet (5 mg total) by mouth 2 (two) times a week.   tamoxifen (NOLVADEX) 20 MG tablet TAKE 1 TABLET BY MOUTH ONCE DAILY   denosumab (PROLIA) 60 MG/ML SOSY injection Inject 60 mg into the skin every 6 (six) months. (Patient not taking: Reported on 12/08/2021)   No facility-administered encounter medications on file as of 12/08/2021.

## 2021-12-08 NOTE — Patient Instructions (Signed)
I have ordered a diagnostic mammogram and ultrasound of your left breast  [facility] Poli will call you to set up  Alprazolam refilled

## 2021-12-08 NOTE — Assessment & Plan Note (Signed)

## 2021-12-09 LAB — MICROALBUMIN / CREATININE URINE RATIO
Creatinine, Urine: 19 mg/dL — ABNORMAL LOW (ref 20–275)
Microalb, Ur: <0.2 mg/dL

## 2021-12-10 DIAGNOSIS — N6321 Unspecified lump in the left breast, upper outer quadrant: Secondary | ICD-10-CM | POA: Insufficient documentation

## 2021-12-10 DIAGNOSIS — Z79810 Long term (current) use of selective estrogen receptor modulators (SERMs): Secondary | ICD-10-CM | POA: Insufficient documentation

## 2021-12-10 NOTE — Assessment & Plan Note (Signed)
Newly found by patientnsince her screening mammogram in June  .  diagnostic mammogram ordered

## 2021-12-10 NOTE — Assessment & Plan Note (Signed)
She has been using tamoxifen since she completed radiation therapy in  2014, having delined chemotherapy

## 2021-12-10 NOTE — Assessment & Plan Note (Addendum)
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  T scores improved to  -2.8 by DEXA  in June 2023. Her use of tamoxifen was from 2016 to 2022.  She is still taking tamoxifen.  will recommend resuming alendronate

## 2021-12-10 NOTE — Assessment & Plan Note (Signed)
Remains well controlled on diet alone. Tolerating Rosuvastatin 5 mg twice weekly.  Lab Results  Component Value Date   HGBA1C 6.2 12/01/2021   Lab Results  Component Value Date   MICROALBUR <0.2 12/08/2021   MICROALBUR <0.7 10/25/2020   Lab Results  Component Value Date   CHOL 177 12/01/2021   HDL 76 12/01/2021   LDLCALC 87 12/01/2021   LDLDIRECT 82.0 06/01/2021   TRIG 58 12/01/2021   CHOLHDL 2.3 12/01/2021

## 2021-12-11 NOTE — Telephone Encounter (Signed)
The last time I had to call prolia the support program, and file fr patient I believe there is number in the portal sorry I am never in patient advice unless you tell me you sent a message.

## 2021-12-12 ENCOUNTER — Ambulatory Visit
Admission: RE | Admit: 2021-12-12 | Discharge: 2021-12-12 | Disposition: A | Discharge status: Home / Self Care | Payer: BC Managed Care – PPO | Source: Ambulatory Visit | Attending: Internal Medicine | Admitting: Internal Medicine

## 2021-12-12 ENCOUNTER — Encounter: Payer: Self-pay | Admitting: Nurse Practitioner

## 2021-12-12 DIAGNOSIS — R922 Inconclusive mammogram: Secondary | ICD-10-CM | POA: Diagnosis not present

## 2021-12-12 DIAGNOSIS — N6321 Unspecified lump in the left breast, upper outer quadrant: Secondary | ICD-10-CM

## 2021-12-12 DIAGNOSIS — N6489 Other specified disorders of breast: Secondary | ICD-10-CM | POA: Diagnosis not present

## 2021-12-20 ENCOUNTER — Other Ambulatory Visit: Payer: Self-pay | Admitting: Internal Medicine

## 2021-12-27 ENCOUNTER — Other Ambulatory Visit: Payer: BC Managed Care – PPO

## 2022-01-17 ENCOUNTER — Telehealth: Payer: Self-pay | Admitting: Internal Medicine

## 2022-01-17 NOTE — Telephone Encounter (Signed)
Rhonda from [place] [facility] need verification of a medicine-fosomax that the pt took in the past between 2017-2021. They need the verification today [phone]-[phone] and appeal [id number]

## 2022-01-17 NOTE — Telephone Encounter (Signed)
I think this should have come to you for Prolia.

## 2022-01-19 NOTE — Telephone Encounter (Signed)
I faxed over information via e-fax

## 2022-01-26 ENCOUNTER — Encounter: Payer: Self-pay | Admitting: Internal Medicine

## 2022-01-26 ENCOUNTER — Telehealth: Payer: Self-pay

## 2022-01-26 NOTE — Telephone Encounter (Signed)
Approval received via mychart. Pt scheduled for 12/14 @ 11:30.  [place] Appeal ID: [id number]  $0 due & pt aware

## 2022-01-26 NOTE — Telephone Encounter (Signed)
See other message. $0 due, pt did appeal on authorization & It has been approved. Pt is scheduled for her injection on 02/01/22. Will scan approval into chart. It was also sent through mychart.

## 2022-01-26 NOTE — Telephone Encounter (Signed)
Patient states she received approval from her insurance company for her Prolia injections.  Patient states she would like to know how to schedule.  I let patient know that I will send this message to [facility] and she will let us know how to schedule her appointment.  Patient states she will try to upload the approval letter to [place].

## 2022-01-28 NOTE — Telephone Encounter (Signed)
Already spoke with patient & appt scheduled for Prolia. See other message

## 2022-01-29 ENCOUNTER — Encounter: Payer: Self-pay | Admitting: Oncology

## 2022-01-31 ENCOUNTER — Ambulatory Visit (INDEPENDENT_AMBULATORY_CARE_PROVIDER_SITE_OTHER): Payer: BC Managed Care – PPO

## 2022-01-31 DIAGNOSIS — M810 Age-related osteoporosis without current pathological fracture: Secondary | ICD-10-CM | POA: Diagnosis not present

## 2022-01-31 MED ORDER — DENOSUMAB 60 MG/ML ~~LOC~~ SOSY
60.0000 mg | PREFILLED_SYRINGE | Freq: Once | SUBCUTANEOUS | Status: AC
  Administered 2022-01-31: 60 mg via SUBCUTANEOUS

## 2022-01-31 NOTE — Progress Notes (Signed)
Pt presented for their subcutaneous Prolia injection. Pt was identified through two identifiers. Pt was given the information packets about the Prolia and told to schedule their next injection 6 months out. Pt tolerated the subq injection well in the right arm.  

## 2022-02-03 ENCOUNTER — Encounter: Payer: Self-pay | Admitting: Oncology

## 2022-06-06 DIAGNOSIS — D1801 Hemangioma of skin and subcutaneous tissue: Secondary | ICD-10-CM | POA: Diagnosis not present

## 2022-06-06 DIAGNOSIS — L910 Hypertrophic scar: Secondary | ICD-10-CM | POA: Diagnosis not present

## 2022-06-08 ENCOUNTER — Telehealth: Payer: Self-pay | Admitting: Internal Medicine

## 2022-06-08 DIAGNOSIS — R5383 Other fatigue: Secondary | ICD-10-CM

## 2022-06-08 DIAGNOSIS — E119 Type 2 diabetes mellitus without complications: Secondary | ICD-10-CM

## 2022-06-08 DIAGNOSIS — E1169 Type 2 diabetes mellitus with other specified complication: Secondary | ICD-10-CM

## 2022-06-08 NOTE — Telephone Encounter (Signed)
Pt called in staying that if she needs fasting labs before she sees [facility] on Friday 4/26?

## 2022-06-11 NOTE — Telephone Encounter (Signed)
Spoke with pt and advised her that per Dr. Darrick Huntsman since she is taking a cholesterol medication she does not need to be fasting for her blood. Pt decided with that being the case she will just have her blood work done the day of her appt.

## 2022-06-15 ENCOUNTER — Ambulatory Visit: Payer: BC Managed Care – PPO | Admitting: Internal Medicine

## 2022-06-15 ENCOUNTER — Encounter: Payer: Self-pay | Admitting: Internal Medicine

## 2022-06-15 VITALS — BP 110/60 | HR 76 | Temp 98.1°F | Ht 60.0 in | Wt 115.2 lb

## 2022-06-15 DIAGNOSIS — Z7984 Long term (current) use of oral hypoglycemic drugs: Secondary | ICD-10-CM

## 2022-06-15 DIAGNOSIS — E119 Type 2 diabetes mellitus without complications: Secondary | ICD-10-CM | POA: Diagnosis not present

## 2022-06-15 DIAGNOSIS — Z79810 Long term (current) use of selective estrogen receptor modulators (SERMs): Secondary | ICD-10-CM

## 2022-06-15 DIAGNOSIS — Z1231 Encounter for screening mammogram for malignant neoplasm of breast: Secondary | ICD-10-CM

## 2022-06-15 NOTE — Assessment & Plan Note (Signed)
Remains well controlled on diet alone. Tolerating Rosuvastatin 5 mg twice weekly.  Lab Results  Component Value Date   HGBA1C 6.2 12/01/2021   Lab Results  Component Value Date   MICROALBUR <0.2 12/08/2021   MICROALBUR <0.7 10/25/2020   Lab Results  Component Value Date   CHOL 177 12/01/2021   HDL 76 12/01/2021   LDLCALC 87 12/01/2021   LDLDIRECT 82.0 06/01/2021   TRIG 58 12/01/2021   CHOLHDL 2.3 12/01/2021      

## 2022-06-15 NOTE — Progress Notes (Unsigned)
Subjective:  Patient ID: Patricia Crane, female    DOB: [birth year]  Age: 52 y.o. MRN: [id number]  CC: There were no encounter diagnoses.   HPI Patricia Crane presents for  Chief Complaint  Patient presents with   Medical Management of Chronic Issues      Outpatient Medications Prior to Visit  Medication Sig Dispense Refill   ALPRAZolam (XANAX) 0.25 MG tablet Take 1 tablet (0.25 mg total) by mouth 2 (two) times daily as needed for sleep or anxiety. 30 tablet 0   Biotin 86578 MCG TABS Take 10,000 mcg by mouth daily.     calcium carbonate (OS-CAL) 600 MG TABS tablet Take 600 mg by mouth 2 (two) times daily with a meal.     Cholecalciferol 50 MCG (2000 UT) CAPS Take by mouth daily.     Cranberry-Vitamin C (CRANBERRY CONCENTRATE/VITAMINC) 15000-100 MG CAPS Take 1 capsule by mouth daily.     denosumab (PROLIA) 60 MG/ML SOSY injection Inject 60 mg into the skin every 6 (six) months. 1 mL 1   Melatonin-Pyridoxine 3-1 MG TABS Take 3 mg by mouth as needed.      Multiple Vitamins-Minerals (MULTIVITAMIN WITH MINERALS) tablet Take 1 tablet by mouth daily.     Probiotic Product (ALIGN) 4 MG CAPS Take 4 mg by mouth daily.     rosuvastatin (CRESTOR) 5 MG tablet Take 1 tablet (5 mg total) by mouth 2 (two) times a week. 24 tablet 3   tamoxifen (NOLVADEX) 20 MG tablet TAKE 1 TABLET DAILY 90 tablet 3   No facility-administered medications prior to visit.    Review of Systems;  Patient denies headache, fevers, malaise, unintentional weight loss, skin rash, eye pain, sinus congestion and sinus pain, sore throat, dysphagia,  hemoptysis , cough, dyspnea, wheezing, chest pain, palpitations, orthopnea, edema, abdominal pain, nausea, melena, diarrhea, constipation, flank pain, dysuria, hematuria, urinary  Frequency, nocturia, numbness, tingling, seizures,  Focal weakness, Loss of consciousness,  Tremor, insomnia, depression, anxiety, and suicidal ideation.      Objective:  BP 110/60   Pulse 76   Temp  98.1 F (36.7 C) (Oral)   Ht 5' (1.524 m)   Wt 115 lb 3.2 oz (52.3 kg)   LMP 12/03/2012 (Approximate)   SpO2 95%   BMI 22.50 kg/m   BP Readings from Last 3 Encounters:  06/15/22 110/60  12/08/21 116/70  09/18/21 104/67    Wt Readings from Last 3 Encounters:  06/15/22 115 lb 3.2 oz (52.3 kg)  12/08/21 116 lb 3.2 oz (52.7 kg)  09/18/21 114 lb (51.7 kg)    Physical Exam  Lab Results  Component Value Date   HGBA1C 6.2 12/01/2021   HGBA1C 6.4 06/01/2021   HGBA1C 6.2 02/16/2021    Lab Results  Component Value Date   CREATININE 0.75 12/01/2021   CREATININE 0.52 09/18/2021   CREATININE 0.70 06/01/2021    Lab Results  Component Value Date   WBC 5.0 09/18/2021   HGB 13.4 09/18/2021   HCT 40.1 09/18/2021   PLT 223 09/18/2021   GLUCOSE 109 (H) 12/01/2021   CHOL 177 12/01/2021   TRIG 58 12/01/2021   HDL 76 12/01/2021   LDLDIRECT 82.0 06/01/2021   LDLCALC 87 12/01/2021   ALT 15 12/01/2021   AST 17 12/01/2021   NA 138 12/01/2021   K 4.2 12/01/2021   CL 101 12/01/2021   CREATININE 0.75 12/01/2021   BUN 16 12/01/2021   CO2 29 12/01/2021   TSH 1.81 06/01/2021  HGBA1C 6.2 12/01/2021   MICROALBUR <0.2 12/08/2021    MM DIAG BREAST TOMO UNI LEFT  Result Date: 12/12/2021 CLINICAL DATA:  Palpable area in the LEFT breast. History of a LEFT lumpectomy in 2014. EXAM: DIGITAL DIAGNOSTIC UNILATERAL LEFT MAMMOGRAM WITH TOMOSYNTHESIS; ULTRASOUND LEFT BREAST LIMITED TECHNIQUE: Left digital diagnostic mammography and breast tomosynthesis was performed.; Targeted ultrasound examination of the left breast was performed. COMPARISON:  Previous exam(s). ACR Breast Density Category c: The breast tissue is heterogeneously dense, which may obscure small masses. FINDINGS: Spot compression tomosynthesis views were obtained over the palpable area of concern in the LEFT breast. No suspicious mammographic finding is identified in this area. Stable postsurgical changes are noted in the LEFT inner  breast. No suspicious mass, microcalcification, or other finding is identified in the LEFT breast. On physical exam, no suspicious mass is appreciated. Postsurgical changes are noted. Patient reports multiple sites of palpable concern but cannot appreciate the upper outer breast site well today. She appreciates the site in the upper inner breast while sitting up. Targeted LEFT breast ultrasound was performed in the palpable area of concern at the upper outer breast. No suspicious solid or cystic mass is identified. Targeted LEFT breast ultrasound was performed in the palpable area of concern at the upper inner breast while sitting up and lying down. No suspicious solid or cystic mass is identified. IMPRESSION: 1. No mammographic or sonographic evidence of malignancy at the sites of palpable concern in the LEFT breast. Any further workup of the patient's symptoms should be based on the clinical assessment. RECOMMENDATION: 1. Annual screening mammography, due June 2024. 2. Given history of breast cancer at a young age as well as heterogeneous breast density, recommend high risk screening protocol with annual mammogram and annual MRI with and without contrast/abbreviated breast MRI. I have discussed the findings and recommendations with the patient. If applicable, a reminder letter will be sent to the patient regarding the next appointment. BI-RADS CATEGORY  2: Benign. Electronically Signed   By: Meda Klinefelter M.D.   On: 12/12/2021 16:22  US BREAST LTD UNI LEFT INC AXILLA  Result Date: 12/12/2021 CLINICAL DATA:  Palpable area in the LEFT breast. History of a LEFT lumpectomy in 2014. EXAM: DIGITAL DIAGNOSTIC UNILATERAL LEFT MAMMOGRAM WITH TOMOSYNTHESIS; ULTRASOUND LEFT BREAST LIMITED TECHNIQUE: Left digital diagnostic mammography and breast tomosynthesis was performed.; Targeted ultrasound examination of the left breast was performed. COMPARISON:  Previous exam(s). ACR Breast Density Category c: The breast  tissue is heterogeneously dense, which may obscure small masses. FINDINGS: Spot compression tomosynthesis views were obtained over the palpable area of concern in the LEFT breast. No suspicious mammographic finding is identified in this area. Stable postsurgical changes are noted in the LEFT inner breast. No suspicious mass, microcalcification, or other finding is identified in the LEFT breast. On physical exam, no suspicious mass is appreciated. Postsurgical changes are noted. Patient reports multiple sites of palpable concern but cannot appreciate the upper outer breast site well today. She appreciates the site in the upper inner breast while sitting up. Targeted LEFT breast ultrasound was performed in the palpable area of concern at the upper outer breast. No suspicious solid or cystic mass is identified. Targeted LEFT breast ultrasound was performed in the palpable area of concern at the upper inner breast while sitting up and lying down. No suspicious solid or cystic mass is identified. IMPRESSION: 1. No mammographic or sonographic evidence of malignancy at the sites of palpable concern in the LEFT breast. Any further  workup of the patient's symptoms should be based on the clinical assessment. RECOMMENDATION: 1. Annual screening mammography, due June 2024. 2. Given history of breast cancer at a young age as well as heterogeneous breast density, recommend high risk screening protocol with annual mammogram and annual MRI with and without contrast/abbreviated breast MRI. I have discussed the findings and recommendations with the patient. If applicable, a reminder letter will be sent to the patient regarding the next appointment. BI-RADS CATEGORY  2: Benign. Electronically Signed   By: Meda Klinefelter M.D.   On: 12/12/2021 16:22   Assessment & Plan:  .There are no diagnoses linked to this encounter.   I provided 30 minutes of face-to-face time during this encounter reviewing patient's last visit with me,  patient's  most recent visit with cardiology,  nephrology,  and neurology,  recent surgical and non surgical procedures, previous  labs and imaging studies, counseling on currently addressed issues,  and post visit ordering to diagnostics and therapeutics .   Follow-up: No follow-ups on file.   Sherlene Shams, MD

## 2022-06-16 LAB — CBC WITH DIFFERENTIAL/PLATELET
Absolute Monocytes: 483 cells/uL (ref 200–950)
Basophils Absolute: 41 cells/uL (ref 0–200)
Basophils Relative: 0.6 %
Eosinophils Absolute: 69 cells/uL (ref 15–500)
Eosinophils Relative: 1 %
HCT: 39.4 % (ref 35.0–45.0)
Hemoglobin: 13.1 g/dL (ref 11.7–15.5)
Lymphs Abs: 2767 cells/uL (ref 850–3900)
MCH: 31.3 pg (ref 27.0–33.0)
MCHC: 33.2 g/dL (ref 32.0–36.0)
MCV: 94 fL (ref 80.0–100.0)
MPV: 9.5 fL (ref 7.5–12.5)
Monocytes Relative: 7 %
Neutro Abs: 3540 cells/uL (ref 1500–7800)
Neutrophils Relative %: 51.3 %
Platelets: 246 10*3/uL (ref 140–400)
RBC: 4.19 10*6/uL (ref 3.80–5.10)
RDW: 12.2 % (ref 11.0–15.0)
Total Lymphocyte: 40.1 %
WBC: 6.9 10*3/uL (ref 3.8–10.8)

## 2022-06-16 LAB — COMPLETE METABOLIC PANEL WITH GFR
AG Ratio: 2 (calc) (ref 1.0–2.5)
ALT: 13 U/L (ref 6–29)
AST: 18 U/L (ref 10–35)
Albumin: 4.4 g/dL (ref 3.6–5.1)
Alkaline phosphatase (APISO): 35 U/L — ABNORMAL LOW (ref 37–153)
BUN: 20 mg/dL (ref 7–25)
CO2: 27 mmol/L (ref 20–32)
Calcium: 9.4 mg/dL (ref 8.6–10.4)
Chloride: 104 mmol/L (ref 98–110)
Creat: 0.78 mg/dL (ref 0.50–1.03)
Globulin: 2.2 g/dL (calc) (ref 1.9–3.7)
Glucose, Bld: 116 mg/dL — ABNORMAL HIGH (ref 65–99)
Potassium: 4.1 mmol/L (ref 3.5–5.3)
Sodium: 139 mmol/L (ref 135–146)
Total Bilirubin: 0.3 mg/dL (ref 0.2–1.2)
Total Protein: 6.6 g/dL (ref 6.1–8.1)
eGFR: 91 mL/min/{1.73_m2} (ref >=60)

## 2022-06-16 LAB — LIPID PANEL
Cholesterol: 158 mg/dL (ref <200)
HDL: 72 mg/dL (ref >=50)
LDL Cholesterol (Calc): 67 mg/dL (calc)
Non-HDL Cholesterol (Calc): 86 mg/dL (calc) (ref <130)
Total CHOL/HDL Ratio: 2.2 (calc) (ref <5.0)
Triglycerides: 103 mg/dL (ref <150)

## 2022-06-16 LAB — HEMOGLOBIN A1C
Hgb A1c MFr Bld: 6.3 % of total Hgb — ABNORMAL HIGH (ref <5.7)
Mean Plasma Glucose: 134 mg/dL
eAG (mmol/L): 7.4 mmol/L

## 2022-07-25 ENCOUNTER — Telehealth: Payer: BC Managed Care – PPO | Admitting: *Deleted

## 2022-07-25 NOTE — Telephone Encounter (Signed)
$  0 due for Prolia; PA required   *Sent to PA team*-(had to do an appeal last time (see previous mychart conversation)

## 2022-07-26 ENCOUNTER — Other Ambulatory Visit (HOSPITAL_COMMUNITY): Payer: Self-pay

## 2022-07-26 ENCOUNTER — Encounter: Payer: Self-pay | Admitting: Oncology

## 2022-07-26 NOTE — Telephone Encounter (Signed)
PA submitted via [employer] portal. Case #: [id number]. Status pending

## 2022-07-27 NOTE — Telephone Encounter (Signed)
PA CANCELED- ACTIVE PA ON FILE THROUGH 01/17/23

## 2022-07-31 NOTE — Telephone Encounter (Signed)
Noted, thanks  Patient scheduled for 08/02/22

## 2022-08-02 ENCOUNTER — Ambulatory Visit (INDEPENDENT_AMBULATORY_CARE_PROVIDER_SITE_OTHER): Payer: BC Managed Care – PPO

## 2022-08-02 DIAGNOSIS — M818 Other osteoporosis without current pathological fracture: Secondary | ICD-10-CM

## 2022-08-02 MED ORDER — DENOSUMAB 60 MG/ML ~~LOC~~ SOSY
60.0000 mg | PREFILLED_SYRINGE | Freq: Once | SUBCUTANEOUS | Status: AC
Start: 2022-08-02 — End: 2022-08-02
  Administered 2022-08-02: 60 mg via SUBCUTANEOUS

## 2022-08-02 NOTE — Progress Notes (Signed)
Patient arrived for a Prolia injection and it was administered into her right arm [place]. Patient tolerated the injection well and did not show any sings of distress or voice any concerns.

## 2022-08-07 ENCOUNTER — Ambulatory Visit: Payer: BC Managed Care – PPO

## 2022-08-09 ENCOUNTER — Ambulatory Visit
Admission: RE | Admit: 2022-08-09 | Discharge: 2022-08-09 | Disposition: A | Discharge status: Home / Self Care | Payer: BC Managed Care – PPO | Source: Ambulatory Visit | Attending: Internal Medicine | Admitting: Internal Medicine

## 2022-08-09 DIAGNOSIS — Z1231 Encounter for screening mammogram for malignant neoplasm of breast: Secondary | ICD-10-CM | POA: Insufficient documentation

## 2022-09-11 ENCOUNTER — Inpatient Hospital Stay: Payer: BC Managed Care – PPO

## 2022-09-11 ENCOUNTER — Encounter: Payer: Self-pay | Admitting: Internal Medicine

## 2022-09-11 ENCOUNTER — Inpatient Hospital Stay: Payer: BC Managed Care – PPO | Attending: Internal Medicine | Admitting: Internal Medicine

## 2022-09-11 VITALS — BP 122/73 | HR 75 | Temp 97.9°F | Resp 19 | Wt 114.2 lb

## 2022-09-11 DIAGNOSIS — Z923 Personal history of irradiation: Secondary | ICD-10-CM | POA: Insufficient documentation

## 2022-09-11 DIAGNOSIS — Z9012 Acquired absence of left breast and nipple: Secondary | ICD-10-CM | POA: Insufficient documentation

## 2022-09-11 DIAGNOSIS — Z801 Family history of malignant neoplasm of trachea, bronchus and lung: Secondary | ICD-10-CM | POA: Diagnosis not present

## 2022-09-11 DIAGNOSIS — C50812 Malignant neoplasm of overlapping sites of left female breast: Secondary | ICD-10-CM

## 2022-09-11 DIAGNOSIS — Z8041 Family history of malignant neoplasm of ovary: Secondary | ICD-10-CM | POA: Insufficient documentation

## 2022-09-11 DIAGNOSIS — Z170 Estrogen receptor positive status [ER+]: Secondary | ICD-10-CM | POA: Diagnosis not present

## 2022-09-11 DIAGNOSIS — C50912 Malignant neoplasm of unspecified site of left female breast: Secondary | ICD-10-CM

## 2022-09-11 DIAGNOSIS — Z808 Family history of malignant neoplasm of other organs or systems: Secondary | ICD-10-CM | POA: Insufficient documentation

## 2022-09-11 DIAGNOSIS — Z79899 Other long term (current) drug therapy: Secondary | ICD-10-CM | POA: Insufficient documentation

## 2022-09-11 DIAGNOSIS — Z87891 Personal history of nicotine dependence: Secondary | ICD-10-CM | POA: Insufficient documentation

## 2022-09-11 DIAGNOSIS — M810 Age-related osteoporosis without current pathological fracture: Secondary | ICD-10-CM | POA: Insufficient documentation

## 2022-09-11 LAB — CBC WITH DIFFERENTIAL/PLATELET
Abs Immature Granulocytes: 0.02 10*3/uL (ref 0.00–0.07)
Basophils Absolute: 0 10*3/uL (ref 0.0–0.1)
Basophils Relative: 1 %
Eosinophils Absolute: 0 10*3/uL (ref 0.0–0.5)
Eosinophils Relative: 0 %
HCT: 40.4 % (ref 36.0–46.0)
Hemoglobin: 13.2 g/dL (ref 12.0–15.0)
Immature Granulocytes: 0 %
Lymphocytes Relative: 30 %
Lymphs Abs: 2.3 10*3/uL (ref 0.7–4.0)
MCH: 31.1 pg (ref 26.0–34.0)
MCHC: 32.7 g/dL (ref 30.0–36.0)
MCV: 95.3 fL (ref 80.0–100.0)
Monocytes Absolute: 0.5 10*3/uL (ref 0.1–1.0)
Monocytes Relative: 7 %
Neutro Abs: 4.8 10*3/uL (ref 1.7–7.7)
Neutrophils Relative %: 62 %
Platelets: 282 10*3/uL (ref 150–400)
RBC: 4.24 MIL/uL (ref 3.87–5.11)
RDW: 12 % (ref 11.5–15.5)
WBC: 7.7 10*3/uL (ref 4.0–10.5)
nRBC: 0 % (ref 0.0–0.2)

## 2022-09-11 LAB — COMPREHENSIVE METABOLIC PANEL
ALT: 22 U/L (ref 0–44)
AST: 22 U/L (ref 15–41)
Albumin: 4.3 g/dL (ref 3.5–5.0)
Alkaline Phosphatase: 33 U/L — ABNORMAL LOW (ref 38–126)
Anion gap: 8 (ref 5–15)
BUN: 20 mg/dL (ref 6–20)
CO2: 28 mmol/L (ref 22–32)
Calcium: 9.4 mg/dL (ref 8.9–10.3)
Chloride: 101 mmol/L (ref 98–111)
Creatinine, Ser: 0.74 mg/dL (ref 0.44–1.00)
GFR, Estimated: >60 mL/min (ref >60)
Glucose, Bld: 119 mg/dL — ABNORMAL HIGH (ref 70–99)
Potassium: 3.6 mmol/L (ref 3.5–5.1)
Sodium: 137 mmol/L (ref 135–145)
Total Bilirubin: 0.3 mg/dL (ref 0.3–1.2)
Total Protein: 7.5 g/dL (ref 6.5–8.1)

## 2022-09-11 NOTE — Progress Notes (Signed)
[facility] CONSULT NOTE  Patient Care Team: Sherlene Shams, MD as PCP - General (Internal Medicine) Earna Coder, MD as Consulting Physician (Internal Medicine)  CHIEF COMPLAINTS/PURPOSE OF CONSULTATION:  Breast cancer  #  Oncology History Overview Note  52 year old female status post partial mastectomy for a stage II (T2, N0, M0) invasive mammary carcinoma ER/PR positive HER-2/neu negative by fish with Oncotype score of 24 and patient declining systemic chemotherapy. left  breast  lower inner quadrant tumor 2.patient did not want chemotherapy.  Had finished radiation therapy (October, 2014) 3.  Starting anti-hormonal therapy  Trelstarand Aromasin nov 2014]. 4.abnormal right breast mammogram(April of 2015) biopsies negative  for malignancy  -------------------------------------------------------------  Patricia Crane is a 52 y.o. female with stage IIA left breast cancer s/p partial mastectomy and sentinel lymph node biopsy on 08/18/2012.  Pathology revealed a 2.5 cm grade III invasive ductal carcinoma of the breast.  Margins were uninvolved, but close (5 mm).  DCIS was present.  One sentinel lymph node was negative.  Tumor was ER + (60%), PR + (25%) and Her2/neu 2+ (negative by FISH).  Pathologic stage was T2N0M0.     Oncotype DX score was 24 which translated to a 16% risk of distant recurrence at 10 years with tamoxifen alone (confidence interval 12-19%).  She declined systemic chemotherapy.  BCI testing on 06/11/2017 revealed a 12.7% risk of late recurrence (years 5-10) and a high likelihood of benefit.   She received radiation.  She was started on [facility] agonist) and Aromasin in 12/2012.  She had a reaction (inflammation at the injection site, swelling and fever). She was switched to Lupron (few joint aches and headache).  Last Lupron injection was in 08/2014.  She was on Aromasin. She began Femara on 07/02/2017 secondary to costs.   She switched to  tamoxifen on 09/15/2019 secondary to osteoporosis.   Bilateral breast MRI on 12/02/2017 revealed no abnormal enhancement in either breast.  Bilateral mammogram on 07/16/2019 revealed no evidence of malignancy.   CA 27.29 was 28.2 on 11/26/2013, 23.2 on 12/01/2014,  27.6 on 10/06/2015, 24.5 on 04/05/2016, 23.3 on 10/04/2016, 21.6 on 04/23/2017, 24 on 10/24/2017, 21.8 on 05/19/2018, 27.7 on 11/18/2018, 23.9 on 07/21/2019, and 13.9 on 02/22/2020.   She has been in menopause.  Last menstrual period was 11/2012.  Estradiol was < 5 and FSH 8.2 on 01/12/2015.  Estradiol was 7.6 and FSH 7.6 on 10/06/2015.  Estradiol was 9.0 and FSH 23.9 on 04/05/2016.  Estradiol was 10.6 and FSH 26.2 on 10/04/2016.  Estradiol was < 5 and FSH 27.2 on 04/23/2017.   Bone density on 05/02/2015 revealed osteoporosis with a T-score of -2.8 in the AP spine.  Bone density on 05/21/2017 revealed osteoporosis with a T-score of -2.8 in AP spine L1-L4.  Bone density on 07/16/2019 revealed osteoporosis with a T-score of -3.1 in the AP spine L1-L4 (L3).  She is on Fosamax, calcium, and vitamin D.   Personal history of breast cancer  06/25/2012 Initial Diagnosis   Malignant neoplasm of breast, stage 2   Basal cell carcinoma of cheek (Resolved)  05/19/2013 Initial Diagnosis   Basal cell carcinoma of cheek   Carcinoma of overlapping sites of left breast in female, estrogen receptor positive (HCC)  09/16/2020 Initial Diagnosis   Carcinoma of overlapping sites of left breast in female, estrogen receptor positive (HCC)      HISTORY OF PRESENTING ILLNESS: Patient ambulating-independently. Alone.   Patricia Crane 52 y.o.  female postmenopausal  with history of stage II ER/PR positive HER2 negative breast cancer currently on extended tamoxifen is here for follow-up.  Patient denies any worsening joint pains or hot flashes.  Denies any nausea vomiting abdominal pain.  Denies any headaches.  Review of Systems  Constitutional:  Negative  for chills, diaphoresis, fever, malaise/fatigue and weight loss.  HENT:  Negative for nosebleeds and sore throat.   Eyes:  Negative for double vision.  Respiratory:  Negative for cough, hemoptysis, sputum production, shortness of breath and wheezing.   Cardiovascular:  Negative for chest pain, palpitations, orthopnea and leg swelling.  Gastrointestinal:  Negative for abdominal pain, blood in stool, constipation, diarrhea, heartburn, melena, nausea and vomiting.  Genitourinary:  Negative for dysuria, frequency and urgency.  Musculoskeletal:  Negative for back pain and joint pain.  Skin: Negative.  Negative for itching and rash.  Neurological:  Negative for dizziness, tingling, focal weakness, weakness and headaches.  Endo/Heme/Allergies:  Does not bruise/bleed easily.  Psychiatric/Behavioral:  Negative for depression. The patient is not nervous/anxious and does not have insomnia.      MEDICAL HISTORY:  Past Medical History:  Diagnosis Date   Basal cell carcinoma of cheek 05/19/2013   Removed by Caprice Beaver    Breast cancer [facility]) 2014    breast invasive mammary carcinoma   Chicken pox    Constipation 05/19/2013   Exposure to COVID-19 virus 08/06/2018   Heart murmur    Personal history of radiation therapy 2014   left breast ca   Shingles    Skin cancer    Thyroid disease     SURGICAL HISTORY: Past Surgical History:  Procedure Laterality Date   BREAST BIOPSY Left 06/2012   invasive mammary   BREAST BIOPSY Right 04/22/2013   neg- core   BREAST LUMPECTOMY Left 08/18/2012   invasive mammary carcinoma. Margins close < 0.11mm but negative, LN negative   COLONOSCOPY WITH PROPOFOL N/A 12/11/2017   Procedure: COLONOSCOPY WITH PROPOFOL;  Surgeon: Wyline Mood, MD;  Location: [facility] ENDOSCOPY;  Service: Gastroenterology;  Laterality: N/A;   HEMORRHOID BANDING     lasix Bilateral     SOCIAL HISTORY: Social History   Socioeconomic History   Marital status: Married    Spouse name: Not  on file   Number of children: 0   Years of education: Not on file   Highest education level: Not on file  Occupational History   Not on file  Tobacco Use   Smoking status: Former    Current packs/day: 0.00    Types: Cigarettes    Quit date: 06/10/2003    Years since quitting: 19.2   Smokeless tobacco: Never   Tobacco comments:    social smoker  Vaping Use   Vaping status: Never Used  Substance and Sexual Activity   Alcohol use: Yes    Comment: occasionally   Drug use: No   Sexual activity: Not on file  Other Topics Concern   Not on file  Social History Narrative   Not on file   Social Determinants of Health   Financial Resource Strain: Low Risk  (12/11/2017)   Overall Financial Resource Strain (CARDIA)    Difficulty of Paying Living Expenses: Not hard at all  Food Insecurity: Unknown (12/11/2017)   Hunger Vital Sign    Worried About Running Out of Food in the Last Year: Never true    Ran Out of Food in the Last Year: Not on file  Transportation Needs: No Transportation Needs (12/11/2017)   PRAPARE -  Administrator, Civil Service (Medical): No    Lack of Transportation (Non-Medical): No  Physical Activity: Not on file  Stress: Not on file  Social Connections: Not on file  Intimate Partner Violence: Not on file    FAMILY HISTORY: Family History  Problem Relation Age of Onset   Hyperlipidemia Mother    Heart disease Mother    Dementia Mother 19       alzheimers   Cancer Father        tonsil ca   Hyperlipidemia Sister    Hyperlipidemia Brother    Heart disease Maternal Aunt    Hyperlipidemia Maternal Aunt    Hypertension Maternal Aunt    Heart disease Maternal Uncle    Hyperlipidemia Maternal Uncle    Stroke Maternal Uncle    Hypertension Maternal Uncle    Cancer Paternal Aunt 51       ovarian ca   Drug abuse Paternal Aunt    Cancer Paternal Uncle        lung CA ,  smoker   Breast cancer Neg Hx     ALLERGIES:  has No Known  Allergies.  MEDICATIONS:  Current Outpatient Medications  Medication Sig Dispense Refill   ALPRAZolam (XANAX) 0.25 MG tablet Take 1 tablet (0.25 mg total) by mouth 2 (two) times daily as needed for sleep or anxiety. 30 tablet 0   Biotin 16109 MCG TABS Take 10,000 mcg by mouth daily.     calcium carbonate (OS-CAL) 600 MG TABS tablet Take 600 mg by mouth 2 (two) times daily with a meal.     Cholecalciferol 50 MCG (2000 UT) CAPS Take by mouth daily.     Cranberry-Vitamin C (CRANBERRY CONCENTRATE/VITAMINC) 15000-100 MG CAPS Take 1 capsule by mouth daily.     denosumab (PROLIA) 60 MG/ML SOSY injection Inject 60 mg into the skin every 6 (six) months. 1 mL 1   Melatonin-Pyridoxine 3-1 MG TABS Take 3 mg by mouth as needed.      Multiple Vitamins-Minerals (MULTIVITAMIN WITH MINERALS) tablet Take 1 tablet by mouth daily.     Probiotic Product (ALIGN) 4 MG CAPS Take 4 mg by mouth daily.     rosuvastatin (CRESTOR) 5 MG tablet Take 1 tablet (5 mg total) by mouth 2 (two) times a week. 24 tablet 3   tamoxifen (NOLVADEX) 20 MG tablet TAKE 1 TABLET DAILY 90 tablet 3   No current facility-administered medications for this visit.      Marland Kitchen  PHYSICAL EXAMINATION: ECOG PERFORMANCE STATUS: 0 - Asymptomatic  Vitals:   09/11/22 1515  BP: 122/73  Pulse: 75  Resp: 19  Temp: 97.9 F (36.6 C)  SpO2: 100%    Filed Weights   09/11/22 1515  Weight: 114 lb 3.2 oz (51.8 kg)     Physical Exam Vitals and nursing note reviewed.  Constitutional:      Comments: Alone.  Ambulating independently.  HENT:     Head: Normocephalic and atraumatic.     Mouth/Throat:     Pharynx: Oropharynx is clear.  Eyes:     Extraocular Movements: Extraocular movements intact.     Pupils: Pupils are equal, round, and reactive to light.  Cardiovascular:     Rate and Rhythm: Normal rate and regular rhythm.  Pulmonary:     Comments: Decreased breath sounds bilaterally.  Abdominal:     Palpations: Abdomen is soft.   Musculoskeletal:        General: Normal range of motion.  Cervical back: Normal range of motion.  Skin:    General: Skin is warm.  Neurological:     General: No focal deficit present.     Mental Status: She is alert and oriented to person, place, and time.  Psychiatric:        Behavior: Behavior normal.        Judgment: Judgment normal.      LABORATORY DATA:  I have reviewed the data as listed Lab Results  Component Value Date   WBC 7.7 09/11/2022   HGB 13.2 09/11/2022   HCT 40.4 09/11/2022   MCV 95.3 09/11/2022   PLT 282 09/11/2022   Recent Labs    09/18/21 0955 12/01/21 0744 06/15/22 1540 09/11/22 1501  NA 141 138 139 137  K 3.9 4.2 4.1 3.6  CL 105 101 104 101  CO2 31 29 27 28   GLUCOSE 103* 109* 116* 119*  BUN 16 16 20 20   CREATININE 0.52 0.75 0.78 0.74  CALCIUM 9.2 9.1 9.4 9.4  GFRNONAA >60  --   --  >60  PROT 7.2 7.0 6.6 7.5  ALBUMIN 4.4 4.4  --  4.3  AST 20 17 18 22   ALT 15 15 13 22   ALKPHOS 29* 29*  --  33*  BILITOT 0.4 0.4 0.3 0.3    RADIOGRAPHIC STUDIES: I have personally reviewed the radiological images as listed and agreed with the findings in the report. No results found.  ASSESSMENT & PLAN:   Carcinoma of overlapping sites of left breast in female, estrogen receptor positive (HCC) #  stage IIA left breast cancer ER/PR positive HER2 negative [IHC 2+ FISH negative] s/p lumpectomy and sentinel lymph node biopsy on 08/18/2012.  Grade 3.  Oncotype recurrence score 24 ; declined chemotherapy.    # Currently on extended adjuvant therapy tamoxifen [concern for osteoporosis; patient menopausal since 2014]. BILATERAL Mammogram [pcp] June 2024-no evidence of recurrence/normal limits.will discuss with radiology re: contrast mammogram.   #Continue tamoxifen until OCT 2024- Total of 10 years.    # BMD- [May 2021] Osteporosis T-score of -3.1-on prolia- 2023 June BMD T-score of -2.8. defer to PCP re: prolia.  # DISPOSITION: # follow up in 12 months-  MD; labs- cbc/cmp/ca-27-29-Dr.B  All questions were answered. The patient knows to call the clinic with any problems, questions or concerns.    Earna Coder, MD 09/11/2022 3:49 PM

## 2022-09-11 NOTE — Assessment & Plan Note (Addendum)
#    stage IIA left breast cancer ER/PR positive HER2 negative [IHC 2+ FISH negative] s/p lumpectomy and sentinel lymph node biopsy on 08/18/2012.  Grade 3.  Oncotype recurrence score 24 ; declined chemotherapy.    # Currently on extended adjuvant therapy tamoxifen [concern for osteoporosis; patient menopausal since 2014]. BILATERAL Mammogram [pcp] June 2024-no evidence of recurrence/normal limits.will discuss with radiology re: contrast mammogram.   #Continue tamoxifen until OCT 2024- Total of 10 years.    # BMD- [May 2021] Osteporosis T-score of -3.1-on prolia- 2023 June BMD T-score of -2.8. defer to PCP re: prolia.  # DISPOSITION: # follow up in 12 months- MD; labs- cbc/cmp/ca-27-29-Dr.B

## 2022-09-11 NOTE — Progress Notes (Signed)
Patient has no concerns 

## 2022-09-12 LAB — CANCER ANTIGEN 27.29: CA 27.29: 22.1 U/mL (ref 0.0–38.6)

## 2022-09-19 ENCOUNTER — Ambulatory Visit: Payer: BC Managed Care – PPO | Admitting: Internal Medicine

## 2022-09-19 ENCOUNTER — Other Ambulatory Visit: Payer: BC Managed Care – PPO

## 2022-09-21 ENCOUNTER — Ambulatory Visit: Payer: BC Managed Care – PPO | Admitting: Internal Medicine

## 2022-09-21 ENCOUNTER — Other Ambulatory Visit: Payer: BC Managed Care – PPO

## 2022-12-28 ENCOUNTER — Encounter: Payer: BC Managed Care – PPO | Admitting: Internal Medicine

## 2023-01-01 ENCOUNTER — Other Ambulatory Visit: Payer: Self-pay

## 2023-01-01 DIAGNOSIS — H524 Presbyopia: Secondary | ICD-10-CM | POA: Diagnosis not present

## 2023-01-01 DIAGNOSIS — H2513 Age-related nuclear cataract, bilateral: Secondary | ICD-10-CM | POA: Diagnosis not present

## 2023-01-01 DIAGNOSIS — E119 Type 2 diabetes mellitus without complications: Secondary | ICD-10-CM | POA: Diagnosis not present

## 2023-01-01 DIAGNOSIS — H5213 Myopia, bilateral: Secondary | ICD-10-CM | POA: Diagnosis not present

## 2023-01-01 DIAGNOSIS — H04123 Dry eye syndrome of bilateral lacrimal glands: Secondary | ICD-10-CM | POA: Diagnosis not present

## 2023-01-01 DIAGNOSIS — H52223 Regular astigmatism, bilateral: Secondary | ICD-10-CM | POA: Diagnosis not present

## 2023-01-01 MED ORDER — CYCLOSPORINE 0.05 % OP EMUL
1.0000 [drp] | Freq: Two times a day (BID) | OPHTHALMIC | 3 refills | Status: AC
  Filled 2023-01-01: qty 60, 30d supply, fill #0

## 2023-01-03 ENCOUNTER — Other Ambulatory Visit: Payer: Self-pay | Admitting: Internal Medicine

## 2023-01-04 ENCOUNTER — Encounter: Payer: Self-pay | Admitting: Internal Medicine

## 2023-01-04 ENCOUNTER — Telehealth: Payer: Self-pay

## 2023-01-04 ENCOUNTER — Ambulatory Visit (INDEPENDENT_AMBULATORY_CARE_PROVIDER_SITE_OTHER): Payer: BC Managed Care – PPO | Admitting: Internal Medicine

## 2023-01-04 VITALS — BP 104/66 | HR 69 | Temp 98.1°F | Ht 60.0 in | Wt 114.6 lb

## 2023-01-04 DIAGNOSIS — R0683 Snoring: Secondary | ICD-10-CM

## 2023-01-04 DIAGNOSIS — E1169 Type 2 diabetes mellitus with other specified complication: Secondary | ICD-10-CM | POA: Diagnosis not present

## 2023-01-04 DIAGNOSIS — E119 Type 2 diabetes mellitus without complications: Secondary | ICD-10-CM

## 2023-01-04 DIAGNOSIS — Z0001 Encounter for general adult medical examination with abnormal findings: Secondary | ICD-10-CM

## 2023-01-04 DIAGNOSIS — Z8639 Personal history of other endocrine, nutritional and metabolic disease: Secondary | ICD-10-CM | POA: Diagnosis not present

## 2023-01-04 DIAGNOSIS — Z853 Personal history of malignant neoplasm of breast: Secondary | ICD-10-CM | POA: Diagnosis not present

## 2023-01-04 DIAGNOSIS — E785 Hyperlipidemia, unspecified: Secondary | ICD-10-CM | POA: Diagnosis not present

## 2023-01-04 DIAGNOSIS — Z79810 Long term (current) use of selective estrogen receptor modulators (SERMs): Secondary | ICD-10-CM

## 2023-01-04 DIAGNOSIS — G4733 Obstructive sleep apnea (adult) (pediatric): Secondary | ICD-10-CM

## 2023-01-04 DIAGNOSIS — Z0000 Encounter for general adult medical examination without abnormal findings: Secondary | ICD-10-CM

## 2023-01-04 DIAGNOSIS — R21 Rash and other nonspecific skin eruption: Secondary | ICD-10-CM

## 2023-01-04 DIAGNOSIS — E559 Vitamin D deficiency, unspecified: Secondary | ICD-10-CM | POA: Diagnosis not present

## 2023-01-04 NOTE — Progress Notes (Unsigned)
Patient ID: Patricia Crane, female    DOB: January 24, 1971  Age: 52 y.o. MRN: [id number]  The patient is here for annual preventive examination and management of other chronic and acute problems.   The risk factors are reflected in the social history.   The roster of all physicians providing medical care to patient - is listed in the Snapshot section of the chart.   Activities of daily living:  The patient is 100% independent in all ADLs: dressing, toileting, feeding as well as independent mobility   Home safety : The patient has smoke detectors in the home. They wear seatbelts.  There are no unsecured firearms at home. There is no violence in the home.    There is no risks for hepatitis, STDs or HIV. There is no   history of blood transfusion. They have no travel history to infectious disease endemic areas of the world.   The patient has seen their dentist in the last six month. They have seen their eye doctor in the last year. The patinet  denies slight hearing difficulty with regard to whispered voices and some television programs.  They have deferred audiologic testing in the last year.  They do not  have excessive sun exposure. Discussed the need for sun protection: hats, long sleeves and use of sunscreen if there is significant sun exposure.    Diet: the importance of a healthy diet is discussed. They do have a healthy diet.   The benefits of regular aerobic exercise were discussed. The patient  exercises  3 to 5 days per week  for  60 minutes.    Depression screen: there are no signs or vegative symptoms of depression- irritability, change in appetite, anhedonia, sadness/tearfullness.   The following portions of the patient's history were reviewed and updated as appropriate: allergies, current medications, past family history, past medical history,  past surgical history, past social history  and problem list.   Visual acuity was not assessed per patient preference since the patient has  regular follow up with an  ophthalmologist. Hearing and body mass index were assessed and reviewed.    During the course of the visit the patient was educated and counseled about appropriate screening and preventive services including : fall prevention , diabetes screening, nutrition counseling, colorectal cancer screening, and recommended immunizations.    Chief Complaint:   1) Type 2 DM: not checking sugars.  Not exercisigng  2) Osteoporosis: receiving Prolia   3) stopped tamoxifen  in September.  No residual symptoms .    4) recurrent Occasional rash on medial side of left knee, managed with benadryl. Abd hydrocortisone.   Lichenified skin.  Cause unclear.  Used triamcinolone in the past.   Using    Review of Symptoms  Patient denies headache, fevers, malaise, unintentional weight loss, skin rash, eye pain, sinus congestion and sinus pain, sore throat, dysphagia,  hemoptysis , cough, dyspnea, wheezing, chest pain, palpitations, orthopnea, edema, abdominal pain, nausea, melena, diarrhea, constipation, flank pain, dysuria, hematuria, urinary  Frequency, nocturia, numbness, tingling, seizures,  Focal weakness, Loss of consciousness,  Tremor, insomnia, depression, anxiety, and suicidal ideation.    Physical Exam:  LMP 12/03/2012 (Approximate)    Physical Exam Vitals reviewed.  Constitutional:      General: She is not in acute distress.    Appearance: Normal appearance. She is well-developed and normal weight. She is not ill-appearing, toxic-appearing or diaphoretic.  HENT:     Head: Normocephalic.     Right Ear: Tympanic  membrane, ear canal and external ear normal. There is no impacted cerumen.     Left Ear: Tympanic membrane, ear canal and external ear normal. There is no impacted cerumen.     Nose: Nose normal.     Mouth/Throat:     Mouth: Mucous membranes are moist.     Pharynx: Oropharynx is clear.  Eyes:     General: No scleral icterus.       Right eye: No discharge.         Left eye: No discharge.     Conjunctiva/sclera: Conjunctivae normal.     Pupils: Pupils are equal, round, and reactive to light.  Neck:     Thyroid: No thyromegaly.     Vascular: No carotid bruit or JVD.  Cardiovascular:     Rate and Rhythm: Normal rate and regular rhythm.     Heart sounds: Normal heart sounds.  Pulmonary:     Effort: Pulmonary effort is normal. No respiratory distress.     Breath sounds: Normal breath sounds.  Chest:  Breasts:    Breasts are symmetrical.     Right: Normal. No swelling, inverted nipple, mass, nipple discharge, skin change or tenderness.     Left: Absent. No swelling, inverted nipple, mass, nipple discharge, skin change or tenderness.       Comments: Mastectomy scar  Abdominal:     General: Bowel sounds are normal.     Palpations: Abdomen is soft. There is no mass.     Tenderness: There is no abdominal tenderness. There is no guarding or rebound.     Hernia: No hernia is present.  Musculoskeletal:        General: Normal range of motion.     Cervical back: Normal range of motion and neck supple.  Lymphadenopathy:     Cervical: No cervical adenopathy.     Upper Body:     Right upper body: No supraclavicular, axillary or pectoral adenopathy.     Left upper body: No supraclavicular, axillary or pectoral adenopathy.  Skin:    General: Skin is warm and dry.  Neurological:     General: No focal deficit present.     Mental Status: She is alert and oriented to person, place, and time. Mental status is at baseline.  Psychiatric:        Mood and Affect: Mood normal.        Behavior: Behavior normal.        Thought Content: Thought content normal.        Judgment: Judgment normal.   Assessment and Plan: Diabetes mellitus type 2 in nonobese (HCC) -     Microalbumin / creatinine urine ratio -     Hemoglobin A1c -     Comprehensive metabolic panel  Hyperlipidemia associated with type 2 diabetes mellitus (HCC) -     Lipid panel -     LDL  cholesterol, direct  Vitamin D deficiency -     VITAMIN D 25 Hydroxy (Vit-D Deficiency, Fractures)  History of Graves' disease -     TSH    No follow-ups on file.  Sherlene Shams, MD

## 2023-01-04 NOTE — Patient Instructions (Signed)
Good to see you Peggy!  I recommend that you follow up with Dr Valentino Saxon for your pelvic exam since you have only recently stopped tamoxifen

## 2023-01-05 LAB — COMPREHENSIVE METABOLIC PANEL
AG Ratio: 1.7 (calc) (ref 1.0–2.5)
ALT: 16 U/L (ref 6–29)
AST: 17 U/L (ref 10–35)
Albumin: 4.5 g/dL (ref 3.6–5.1)
Alkaline phosphatase (APISO): 38 U/L (ref 37–153)
BUN: 15 mg/dL (ref 7–25)
CO2: 27 mmol/L (ref 20–32)
Calcium: 10 mg/dL (ref 8.6–10.4)
Chloride: 101 mmol/L (ref 98–110)
Creat: 0.66 mg/dL (ref 0.50–1.03)
Globulin: 2.7 g/dL (ref 1.9–3.7)
Glucose, Bld: 87 mg/dL (ref 65–99)
Potassium: 4.1 mmol/L (ref 3.5–5.3)
Sodium: 139 mmol/L (ref 135–146)
Total Bilirubin: 0.3 mg/dL (ref 0.2–1.2)
Total Protein: 7.2 g/dL (ref 6.1–8.1)

## 2023-01-05 LAB — MICROALBUMIN / CREATININE URINE RATIO
Creatinine, Urine: 36 mg/dL (ref 20–275)
Microalb, Ur: <0.2 mg/dL

## 2023-01-05 LAB — HEMOGLOBIN A1C
Hgb A1c MFr Bld: 6.4 %{Hb} — ABNORMAL HIGH (ref <5.7)
Mean Plasma Glucose: 137 mg/dL
eAG (mmol/L): 7.6 mmol/L

## 2023-01-05 LAB — TSH: TSH: 1.1 m[IU]/L

## 2023-01-05 LAB — LIPID PANEL
Cholesterol: 181 mg/dL (ref <200)
HDL: 74 mg/dL (ref >=50)
LDL Cholesterol (Calc): 91 mg/dL
Non-HDL Cholesterol (Calc): 107 mg/dL (ref <130)
Total CHOL/HDL Ratio: 2.4 (calc) (ref <5.0)
Triglycerides: 72 mg/dL (ref <150)

## 2023-01-05 LAB — VITAMIN D 25 HYDROXY (VIT D DEFICIENCY, FRACTURES): Vit D, 25-Hydroxy: 48 ng/mL (ref 30–100)

## 2023-01-05 LAB — LDL CHOLESTEROL, DIRECT: Direct LDL: 98 mg/dL (ref <100)

## 2023-01-06 ENCOUNTER — Encounter: Payer: Self-pay | Admitting: Internal Medicine

## 2023-01-06 NOTE — Assessment & Plan Note (Signed)
Mild by 2018 home study. Treatment deferred by patient

## 2023-01-06 NOTE — Assessment & Plan Note (Signed)
Screening mammogram has been normal.  She  completed ten years of adjuvant therapy , ending tamoxifen in Sept

## 2023-01-06 NOTE — Assessment & Plan Note (Signed)
She had been using tamoxifen since she completed radiation therapy in  2014, having declined chemotherapy. Tamoxifen use ended in Sept 2024

## 2023-01-06 NOTE — Assessment & Plan Note (Signed)
Well controlled diabetes on diet alone.  She is tolerating low dose Crestor twice weekly.  She has no microalbuminuria  Lab Results  Component Value Date   HGBA1C 6.4 (H) 01/04/2023    .    Lab Results  Component Value Date   CHOL 181 01/04/2023   HDL 74 01/04/2023   LDLCALC 91 01/04/2023   LDLDIRECT 98 01/04/2023   TRIG 72 01/04/2023   CHOLHDL 2.4 01/04/2023   Lab Results  Component Value Date   MICROALBUR <0.2 01/04/2023   MICROALBUR <0.2 12/08/2021

## 2023-01-06 NOTE — Assessment & Plan Note (Signed)
She has no history of tick bites or of fever . Suspect viral exanthem,  But she was COVID NEGATIVE

## 2023-01-06 NOTE — Assessment & Plan Note (Signed)

## 2023-01-07 NOTE — Telephone Encounter (Signed)
Error

## 2023-01-08 MED ORDER — DENOSUMAB 60 MG/ML ~~LOC~~ SOSY
60.0000 mg | PREFILLED_SYRINGE | Freq: Once | SUBCUTANEOUS | Status: AC
  Administered 2023-02-07: 60 mg via SUBCUTANEOUS

## 2023-01-08 NOTE — Addendum Note (Signed)
Addended by: Warden Fillers on: 01/08/2023 02:53 PM   Modules accepted: Orders

## 2023-01-14 ENCOUNTER — Other Ambulatory Visit: Payer: Self-pay

## 2023-01-16 ENCOUNTER — Telehealth: Payer: Self-pay

## 2023-01-16 ENCOUNTER — Telehealth: Payer: Self-pay | Admitting: *Deleted

## 2023-01-16 NOTE — Telephone Encounter (Addendum)
$  0 due; PA required (current PA expires this month) **Routing to PA team** (Please use referral workque for PA)  Scheduled for Prolia on 02/07/23

## 2023-01-16 NOTE — Telephone Encounter (Signed)
Pharmacy Patient Advocate Encounter   Received notification from REFERRALS that prior authorization for PROLIA is required/requested.   Insurance verification completed.   The patient is insured through [facility] .   Per test claim: PA required; PA submitted to above mentioned insurance via Fax Key/confirmation #/EOC --- Status is pending

## 2023-01-16 NOTE — Telephone Encounter (Signed)
-----   Message from Claretha Cooper sent at 01/04/2023  4:42 PM EST ----- Hi Glee Arvin-  Peggy asked to schedule her next Prolia injection at check-out today.  I saw that she had had one earlier in the year, so I scheduled it for 02/07/2023.  She told me that she thinks her prior authorization for Prolia was going to expire in November after we had the appointment scheduled.  She wanted to keep her appointment for now.  I let her know that I will send a message to you, because a new prior authorization will need to be sent.  Please let me know if there is anything you need me to do.  Thanks, Selena Batten

## 2023-01-21 ENCOUNTER — Other Ambulatory Visit (HOSPITAL_COMMUNITY): Payer: Self-pay

## 2023-01-21 NOTE — Telephone Encounter (Signed)
Pharmacy Patient Advocate Encounter  Received notification from [facility] that Prior Authorization for PROLIA has been APPROVED from 01/18/23 to 01/17/24   PA #/Case ID/Reference #: [id number]

## 2023-01-28 NOTE — Telephone Encounter (Signed)
 Pharmacy Patient Advocate Encounter  Received notification from [facility] that Prior Authorization for PROLIA has been APPROVED from 01/18/23 to 01/17/24   PA #/Case ID/Reference #: [id number]

## 2023-02-06 ENCOUNTER — Encounter: Payer: Self-pay | Admitting: *Deleted

## 2023-02-07 ENCOUNTER — Ambulatory Visit: Payer: BC Managed Care – PPO

## 2023-02-07 DIAGNOSIS — M818 Other osteoporosis without current pathological fracture: Secondary | ICD-10-CM

## 2023-02-07 MED ORDER — DENOSUMAB 60 MG/ML ~~LOC~~ SOSY: 60.0000 mg | PREFILLED_SYRINGE | Freq: Once | SUBCUTANEOUS | Status: AC

## 2023-02-07 NOTE — Progress Notes (Signed)
After obtaining consent, and per orders of Dr. Darrick Huntsman, injection of prolia given by Kristie Cowman in left arm. Patient tolerated injection well

## 2023-06-24 DIAGNOSIS — M9903 Segmental and somatic dysfunction of lumbar region: Secondary | ICD-10-CM | POA: Diagnosis not present

## 2023-06-24 DIAGNOSIS — M9902 Segmental and somatic dysfunction of thoracic region: Secondary | ICD-10-CM | POA: Diagnosis not present

## 2023-06-24 DIAGNOSIS — M9904 Segmental and somatic dysfunction of sacral region: Secondary | ICD-10-CM | POA: Diagnosis not present

## 2023-06-24 DIAGNOSIS — M9901 Segmental and somatic dysfunction of cervical region: Secondary | ICD-10-CM | POA: Diagnosis not present

## 2023-07-01 DIAGNOSIS — M9902 Segmental and somatic dysfunction of thoracic region: Secondary | ICD-10-CM | POA: Diagnosis not present

## 2023-07-01 DIAGNOSIS — M9901 Segmental and somatic dysfunction of cervical region: Secondary | ICD-10-CM | POA: Diagnosis not present

## 2023-07-01 DIAGNOSIS — M9903 Segmental and somatic dysfunction of lumbar region: Secondary | ICD-10-CM | POA: Diagnosis not present

## 2023-07-01 DIAGNOSIS — M9904 Segmental and somatic dysfunction of sacral region: Secondary | ICD-10-CM | POA: Diagnosis not present

## 2023-07-05 ENCOUNTER — Encounter: Payer: Self-pay | Admitting: Internal Medicine

## 2023-07-05 ENCOUNTER — Telehealth: Payer: Self-pay

## 2023-07-05 ENCOUNTER — Ambulatory Visit: Payer: BC Managed Care – PPO | Admitting: Internal Medicine

## 2023-07-05 ENCOUNTER — Other Ambulatory Visit (HOSPITAL_COMMUNITY): Payer: Self-pay

## 2023-07-05 VITALS — BP 120/68 | HR 64 | Ht 60.0 in | Wt 121.6 lb

## 2023-07-05 DIAGNOSIS — E119 Type 2 diabetes mellitus without complications: Secondary | ICD-10-CM

## 2023-07-05 DIAGNOSIS — M818 Other osteoporosis without current pathological fracture: Secondary | ICD-10-CM

## 2023-07-05 DIAGNOSIS — M9902 Segmental and somatic dysfunction of thoracic region: Secondary | ICD-10-CM | POA: Diagnosis not present

## 2023-07-05 DIAGNOSIS — I341 Nonrheumatic mitral (valve) prolapse: Secondary | ICD-10-CM | POA: Diagnosis not present

## 2023-07-05 DIAGNOSIS — M9901 Segmental and somatic dysfunction of cervical region: Secondary | ICD-10-CM | POA: Diagnosis not present

## 2023-07-05 DIAGNOSIS — M9904 Segmental and somatic dysfunction of sacral region: Secondary | ICD-10-CM | POA: Diagnosis not present

## 2023-07-05 DIAGNOSIS — E1169 Type 2 diabetes mellitus with other specified complication: Secondary | ICD-10-CM | POA: Diagnosis not present

## 2023-07-05 DIAGNOSIS — Z1231 Encounter for screening mammogram for malignant neoplasm of breast: Secondary | ICD-10-CM | POA: Diagnosis not present

## 2023-07-05 DIAGNOSIS — E785 Hyperlipidemia, unspecified: Secondary | ICD-10-CM

## 2023-07-05 DIAGNOSIS — M9903 Segmental and somatic dysfunction of lumbar region: Secondary | ICD-10-CM | POA: Diagnosis not present

## 2023-07-05 LAB — POCT GLYCOSYLATED HEMOGLOBIN (HGB A1C): Hemoglobin A1C: 6.2 % — AB (ref 4.0–5.6)

## 2023-07-05 NOTE — Telephone Encounter (Signed)
 Pt ready for scheduling for PROLIA  on or after : 08/07/23  Option# 1: Buy/Bill (Office supplied medication)  Out-of-pocket cost due at time of clinic visit: $0  Number of injection/visits approved: 2  Primary: [employer] Prolia  co-insurance: 0% Admin fee co-insurance: 0%  Secondary: --- Prolia  co-insurance:  Admin fee co-insurance:   Medical Benefit Details: Date Benefits were checked: 06/13/23 Deductible: NO/ Coinsurance: 0%/ Admin Fee: 0%  Prior Auth: APPROVED PA# [id number] Expiration Date: 01/18/23-01/17/24   # of doses approved: 2 ----------------------------------------------------------------------- Option# 2- Med Obtained from pharmacy:  Pharmacy benefit: Copay $--- (Paid to pharmacy) Admin Fee: --- (Pay at clinic)  Prior Auth: --- PA# Expiration Date:   # of doses approved:   If patient wants fill through the pharmacy benefit please send prescription to: ---, and include estimated need by date in rx notes. Pharmacy will ship medication directly to the office.  Patient IS eligible for Prolia  Copay Card. Copay Card can make patient's cost as little as $25. Link to apply: [email or web address]://www.amgensupportplus.[email or web address]/copay  ** This summary of benefits is an estimation of the patient's out-of-pocket cost. Exact cost may very based on individual plan coverage.

## 2023-07-05 NOTE — Patient Instructions (Signed)
   You might want to try using Relaxium for insomnia  (as seen on TV commercials) . It is available through [employer] and contains all natural supplements:  Melatonin 5 mg  Chamomile 25 mg Passionflower extract 75 mg GABA 100 mg Ashwaganda extract 125 mg Magnesium citrate, glycinate, oxide (100 mg)  L tryptophan 500 mg Valerest (proprietary  ingredient ; probably valeria root extract)

## 2023-07-05 NOTE — Progress Notes (Signed)
 +  Subjective:  Patient ID: Patricia Crane, female    DOB: Oct 18, 1970  Age: 53 y.o. MRN: [id number]  CC: The primary encounter diagnosis was Diabetes mellitus type 2 in nonobese [facility]). Diagnoses of Hyperlipidemia associated with type 2 diabetes mellitus (HCC), Other osteoporosis without current pathological fracture, Breast cancer screening by mammogram, and Mitral valve prolapse syndrome were also pertinent to this visit.   HPI Patricia Crane presents for  Chief Complaint  Patient presents with   Medical Management of Chronic Issues    6 month follow up    1)  T2DM:    She  feels generally well,  But is not  exercising regularly or trying to lose weight. Checking  blood sugars less than once daily at variable times, usually only if she feels she may be having a hypoglycemic event. .  BS have been under 125 fasting and < 150 post prandially.  Denies any recent hypoglyemic events.  Taking   medications as directed. Following a carbohydrate modified diet 6 days per week. Denies numbness, burning and tingling of extremities. Appetite is good.  asting glu 125 or less   2) osteoporosis   she is taking Prolia  ; her last  dose Dec 2024   3) GAD:  her mother is in [place] and her husband's mother is declining in health and requiring help . She anticipates that her mother in law will be moving in with them in the near tfuture    Outpatient Medications Prior to Visit  Medication Sig Dispense Refill   ALPRAZolam  (XANAX ) 0.25 MG tablet Take 1 tablet (0.25 mg total) by mouth 2 (two) times daily as needed for sleep or anxiety. 30 tablet 0   Biotin 40981 MCG TABS Take 10,000 mcg by mouth daily.     calcium  carbonate (OS-CAL) 600 MG TABS tablet Take 600 mg by mouth 2 (two) times daily with a meal.     Cholecalciferol 50 MCG (2000 UT) CAPS Take by mouth daily.     Cranberry-Vitamin C (CRANBERRY CONCENTRATE/VITAMINC) 15000-100 MG CAPS Take 1 capsule by mouth daily.     cycloSPORINE  (RESTASIS ) 0.05 %  ophthalmic emulsion Place 1 drop into affected eyes 2 (two) times daily. 180 each 3   denosumab  (PROLIA ) 60 MG/ML SOSY injection Inject 60 mg into the skin every 6 (six) months. 1 mL 1   Melatonin-Pyridoxine 3-1 MG TABS Take 3 mg by mouth as needed.      Multiple Vitamins-Minerals (MULTIVITAMIN WITH MINERALS) tablet Take 1 tablet by mouth daily.     rosuvastatin  (CRESTOR ) 5 MG tablet TAKE 1 TABLET TWICE A WEEK 24 tablet 3   Probiotic Product (ALIGN) 4 MG CAPS Take 4 mg by mouth daily.     Facility-Administered Medications Prior to Visit  Medication Dose Route Frequency Provider Last Rate Last Admin   [START ON 08/08/2023] denosumab  (PROLIA ) injection 60 mg  60 mg Subcutaneous Once Dougles Kimmey L, MD        Review of Systems;  Patient denies headache, fevers, malaise, unintentional weight loss, skin rash, eye pain, sinus congestion and sinus pain, sore throat, dysphagia,  hemoptysis , cough, dyspnea, wheezing, chest pain, palpitations, orthopnea, edema, abdominal pain, nausea, melena, diarrhea, constipation, flank pain, dysuria, hematuria, urinary  Frequency, nocturia, numbness, tingling, seizures,  Focal weakness, Loss of consciousness,  Tremor, insomnia, depression, anxiety, and suicidal ideation.      Objective:  BP 120/68   Pulse 64   Ht 5' (1.524 m)   Wt 121  lb 9.6 oz (55.2 kg)   LMP 12/03/2012 (Approximate)   SpO2 98%   BMI 23.75 kg/m   BP Readings from Last 3 Encounters:  07/05/23 120/68  01/04/23 104/66  09/11/22 122/73    Wt Readings from Last 3 Encounters:  07/05/23 121 lb 9.6 oz (55.2 kg)  01/04/23 114 lb 9.6 oz (52 kg)  09/11/22 114 lb 3.2 oz (51.8 kg)    Physical Exam Vitals reviewed.  Constitutional:      General: She is not in acute distress.    Appearance: Normal appearance. She is normal weight. She is not ill-appearing, toxic-appearing or diaphoretic.  HENT:     Head: Normocephalic.  Eyes:     General: No scleral icterus.       Right eye: No  discharge.        Left eye: No discharge.     Conjunctiva/sclera: Conjunctivae normal.  Cardiovascular:     Rate and Rhythm: Normal rate and regular rhythm.     Heart sounds: Normal heart sounds.  Pulmonary:     Effort: Pulmonary effort is normal. No respiratory distress.     Breath sounds: Normal breath sounds.  Musculoskeletal:        General: Normal range of motion.  Skin:    General: Skin is warm and dry.  Neurological:     General: No focal deficit present.     Mental Status: She is alert and oriented to person, place, and time. Mental status is at baseline.  Psychiatric:        Mood and Affect: Mood normal.        Behavior: Behavior normal.        Thought Content: Thought content normal.        Judgment: Judgment normal.    Lab Results  Component Value Date   HGBA1C 6.2 (A) 07/05/2023   HGBA1C 6.4 (H) 01/04/2023   HGBA1C 6.3 (H) 06/15/2022    Lab Results  Component Value Date   CREATININE 0.66 01/04/2023   CREATININE 0.74 09/11/2022   CREATININE 0.78 06/15/2022    Lab Results  Component Value Date   WBC 7.7 09/11/2022   HGB 13.2 09/11/2022   HCT 40.4 09/11/2022   PLT 282 09/11/2022   GLUCOSE 87 01/04/2023   CHOL 181 01/04/2023   TRIG 72 01/04/2023   HDL 74 01/04/2023   LDLDIRECT 98 01/04/2023   LDLCALC 91 01/04/2023   ALT 16 01/04/2023   AST 17 01/04/2023   NA 139 01/04/2023   K 4.1 01/04/2023   CL 101 01/04/2023   CREATININE 0.66 01/04/2023   BUN 15 01/04/2023   CO2 27 01/04/2023   TSH 1.10 01/04/2023   HGBA1C 6.2 (A) 07/05/2023   MICROALBUR <0.2 01/04/2023    MM 3D SCREENING MAMMOGRAM BILATERAL BREAST Result Date: 08/13/2022 CLINICAL DATA:  Screening. EXAM: DIGITAL SCREENING BILATERAL MAMMOGRAM WITH TOMOSYNTHESIS AND CAD TECHNIQUE: Bilateral screening digital craniocaudal and mediolateral oblique mammograms were obtained. Bilateral screening digital breast tomosynthesis was performed. The images were evaluated with computer-aided detection.  COMPARISON:  Previous exam(s). ACR Breast Density Category c: The breasts are heterogeneously dense, which may obscure small masses. FINDINGS: There are no findings suspicious for malignancy. IMPRESSION: No mammographic evidence of malignancy. A result letter of this screening mammogram will be mailed directly to the patient. RECOMMENDATION: Screening mammogram in one year. (Code:SM-B-01Y) BI-RADS CATEGORY  1: Negative. Electronically Signed   By: Serena  Chacko M.D.   On: 08/13/2022 10:41    Assessment & Plan:  .  Diabetes mellitus type 2 in nonobese [facility]) Assessment & Plan: Remains well controlled on diet alone. Tolerating Rosuvastatin  5 mg twice weekly.  Lab Results  Component Value Date   HGBA1C 6.2 (A) 07/05/2023   Lab Results  Component Value Date   MICROALBUR <0.2 01/04/2023   MICROALBUR <0.2 12/08/2021   Lab Results  Component Value Date   CHOL 181 01/04/2023   HDL 74 01/04/2023   LDLCALC 91 01/04/2023   LDLDIRECT 98 01/04/2023   TRIG 72 01/04/2023   CHOLHDL 2.4 01/04/2023     Remains well controlled on diet alone.  No proteinuria.  Tolerating Rosuvastatin  5 mg twice weekly.  Lab Results  Component Value Date   HGBA1C 6.2 (A) 07/05/2023   Lab Results  Component Value Date   MICROALBUR <0.2 01/04/2023   MICROALBUR <0.2 12/08/2021   Lab Results  Component Value Date   CHOL 181 01/04/2023   HDL 74 01/04/2023   LDLCALC 91 01/04/2023   LDLDIRECT 98 01/04/2023   TRIG 72 01/04/2023   CHOLHDL 2.4 01/04/2023       Orders: -     Lipid panel; Future -     Hemoglobin A1c; Future -     POCT glycosylated hemoglobin (Hb A1C)  Hyperlipidemia associated with type 2 diabetes mellitus (HCC) Assessment & Plan: Well controlled diabetes on diet alone.  She is tolerating low dose Crestor  twice weekly.  She has no microalbuminuria  Lab Results  Component Value Date   HGBA1C 6.2 (A) 07/05/2023    .    Lab Results  Component Value Date   CHOL 181 01/04/2023   HDL 74  01/04/2023   LDLCALC 91 01/04/2023   LDLDIRECT 98 01/04/2023   TRIG 72 01/04/2023   CHOLHDL 2.4 01/04/2023   Lab Results  Component Value Date   MICROALBUR <0.2 01/04/2023   MICROALBUR <0.2 12/08/2021       Orders: -     LDL cholesterol, direct; Future -     Comprehensive metabolic panel with GFR; Future  Other osteoporosis without current pathological fracture Assessment & Plan:  T scores improved to  -2.8 by DEXA  in June 2023. Her use of tamoxifen  was from 2016 to 2022.  She has resume  udse of  Prolia   and s due in June for injection  Orders: -     DG Bone Density; Future  Breast cancer screening by mammogram -     3D Screening Mammogram, Left and Right; Future  Mitral valve prolapse syndrome Assessment & Plan: With   Grade 3/6 murmur by recent ECHO.        Follow-up: Return in about 6 months (around 01/05/2024).   Thersia Flax, MD

## 2023-07-05 NOTE — Telephone Encounter (Signed)
 Patricia Crane

## 2023-07-07 NOTE — Assessment & Plan Note (Signed)
 With   Grade 3/6 murmur by recent ECHO.

## 2023-07-07 NOTE — Assessment & Plan Note (Addendum)
 Remains well controlled on diet alone. Tolerating Rosuvastatin  5 mg twice weekly.  Lab Results  Component Value Date   HGBA1C 6.2 (A) 07/05/2023   Lab Results  Component Value Date   MICROALBUR <0.2 01/04/2023   MICROALBUR <0.2 12/08/2021   Lab Results  Component Value Date   CHOL 181 01/04/2023   HDL 74 01/04/2023   LDLCALC 91 01/04/2023   LDLDIRECT 98 01/04/2023   TRIG 72 01/04/2023   CHOLHDL 2.4 01/04/2023     Remains well controlled on diet alone.  No proteinuria.  Tolerating Rosuvastatin  5 mg twice weekly.  Lab Results  Component Value Date   HGBA1C 6.2 (A) 07/05/2023   Lab Results  Component Value Date   MICROALBUR <0.2 01/04/2023   MICROALBUR <0.2 12/08/2021   Lab Results  Component Value Date   CHOL 181 01/04/2023   HDL 74 01/04/2023   LDLCALC 91 01/04/2023   LDLDIRECT 98 01/04/2023   TRIG 72 01/04/2023   CHOLHDL 2.4 01/04/2023

## 2023-07-07 NOTE — Assessment & Plan Note (Signed)
 Well controlled diabetes on diet alone.  She is tolerating low dose Crestor  twice weekly.  She has no microalbuminuria  Lab Results  Component Value Date   HGBA1C 6.2 (A) 07/05/2023    .    Lab Results  Component Value Date   CHOL 181 01/04/2023   HDL 74 01/04/2023   LDLCALC 91 01/04/2023   LDLDIRECT 98 01/04/2023   TRIG 72 01/04/2023   CHOLHDL 2.4 01/04/2023   Lab Results  Component Value Date   MICROALBUR <0.2 01/04/2023   MICROALBUR <0.2 12/08/2021

## 2023-07-07 NOTE — Assessment & Plan Note (Addendum)
 T scores improved to  -2.8 by DEXA  in June 2023. Her use of tamoxifen  was from 2016 to 2022.  She has resume  udse of  Prolia   and s due in June for injection

## 2023-07-08 DIAGNOSIS — M9903 Segmental and somatic dysfunction of lumbar region: Secondary | ICD-10-CM | POA: Diagnosis not present

## 2023-07-08 DIAGNOSIS — M9902 Segmental and somatic dysfunction of thoracic region: Secondary | ICD-10-CM | POA: Diagnosis not present

## 2023-07-08 DIAGNOSIS — M9901 Segmental and somatic dysfunction of cervical region: Secondary | ICD-10-CM | POA: Diagnosis not present

## 2023-07-08 DIAGNOSIS — M9904 Segmental and somatic dysfunction of sacral region: Secondary | ICD-10-CM | POA: Diagnosis not present

## 2023-07-12 ENCOUNTER — Other Ambulatory Visit (INDEPENDENT_AMBULATORY_CARE_PROVIDER_SITE_OTHER)

## 2023-07-12 DIAGNOSIS — E785 Hyperlipidemia, unspecified: Secondary | ICD-10-CM | POA: Diagnosis not present

## 2023-07-12 DIAGNOSIS — E1169 Type 2 diabetes mellitus with other specified complication: Secondary | ICD-10-CM | POA: Diagnosis not present

## 2023-07-12 DIAGNOSIS — E119 Type 2 diabetes mellitus without complications: Secondary | ICD-10-CM

## 2023-07-12 DIAGNOSIS — M9904 Segmental and somatic dysfunction of sacral region: Secondary | ICD-10-CM | POA: Diagnosis not present

## 2023-07-12 DIAGNOSIS — M9903 Segmental and somatic dysfunction of lumbar region: Secondary | ICD-10-CM | POA: Diagnosis not present

## 2023-07-12 DIAGNOSIS — M9902 Segmental and somatic dysfunction of thoracic region: Secondary | ICD-10-CM | POA: Diagnosis not present

## 2023-07-12 DIAGNOSIS — M9901 Segmental and somatic dysfunction of cervical region: Secondary | ICD-10-CM | POA: Diagnosis not present

## 2023-07-12 LAB — COMPREHENSIVE METABOLIC PANEL WITH GFR
ALT: 19 U/L (ref 0–35)
AST: 17 U/L (ref 0–37)
Albumin: 4.6 g/dL (ref 3.5–5.2)
Alkaline Phosphatase: 45 U/L (ref 39–117)
BUN: 11 mg/dL (ref 6–23)
CO2: 32 meq/L (ref 19–32)
Calcium: 9.4 mg/dL (ref 8.4–10.5)
Chloride: 101 meq/L (ref 96–112)
Creatinine, Ser: 0.63 mg/dL (ref 0.40–1.20)
GFR: 100.22 mL/min (ref >60.00)
Glucose, Bld: 118 mg/dL — ABNORMAL HIGH (ref 70–99)
Potassium: 4 meq/L (ref 3.5–5.1)
Sodium: 140 meq/L (ref 135–145)
Total Bilirubin: 0.4 mg/dL (ref 0.2–1.2)
Total Protein: 7.2 g/dL (ref 6.0–8.3)

## 2023-07-12 LAB — LIPID PANEL
Cholesterol: 188 mg/dL (ref 0–200)
HDL: 76 mg/dL (ref >39.00)
LDL Cholesterol: 100 mg/dL — ABNORMAL HIGH (ref 0–99)
NonHDL: 111.85
Total CHOL/HDL Ratio: 2
Triglycerides: 58 mg/dL (ref 0.0–149.0)
VLDL: 11.6 mg/dL (ref 0.0–40.0)

## 2023-07-12 LAB — LDL CHOLESTEROL, DIRECT: Direct LDL: 95 mg/dL

## 2023-07-12 LAB — HEMOGLOBIN A1C: Hgb A1c MFr Bld: 6.5 % (ref 4.6–6.5)

## 2023-07-15 ENCOUNTER — Other Ambulatory Visit: Payer: Self-pay | Admitting: Internal Medicine

## 2023-07-15 ENCOUNTER — Ambulatory Visit: Payer: Self-pay | Admitting: Internal Medicine

## 2023-07-15 DIAGNOSIS — E1169 Type 2 diabetes mellitus with other specified complication: Secondary | ICD-10-CM

## 2023-07-15 DIAGNOSIS — E119 Type 2 diabetes mellitus without complications: Secondary | ICD-10-CM

## 2023-07-15 MED ORDER — ROSUVASTATIN CALCIUM 5 MG PO TABS: 5.0000 mg | ORAL_TABLET | ORAL | 3 refills | Status: DC

## 2023-07-17 ENCOUNTER — Encounter: Payer: Self-pay | Admitting: *Deleted

## 2023-07-17 DIAGNOSIS — M9903 Segmental and somatic dysfunction of lumbar region: Secondary | ICD-10-CM | POA: Diagnosis not present

## 2023-07-17 DIAGNOSIS — M9902 Segmental and somatic dysfunction of thoracic region: Secondary | ICD-10-CM | POA: Diagnosis not present

## 2023-07-17 DIAGNOSIS — M9901 Segmental and somatic dysfunction of cervical region: Secondary | ICD-10-CM | POA: Diagnosis not present

## 2023-07-17 DIAGNOSIS — M9904 Segmental and somatic dysfunction of sacral region: Secondary | ICD-10-CM | POA: Diagnosis not present

## 2023-07-22 DIAGNOSIS — M9904 Segmental and somatic dysfunction of sacral region: Secondary | ICD-10-CM | POA: Diagnosis not present

## 2023-07-22 DIAGNOSIS — M9902 Segmental and somatic dysfunction of thoracic region: Secondary | ICD-10-CM | POA: Diagnosis not present

## 2023-07-22 DIAGNOSIS — M9901 Segmental and somatic dysfunction of cervical region: Secondary | ICD-10-CM | POA: Diagnosis not present

## 2023-07-22 DIAGNOSIS — M9903 Segmental and somatic dysfunction of lumbar region: Secondary | ICD-10-CM | POA: Diagnosis not present

## 2023-07-26 DIAGNOSIS — M9901 Segmental and somatic dysfunction of cervical region: Secondary | ICD-10-CM | POA: Diagnosis not present

## 2023-07-26 DIAGNOSIS — M9904 Segmental and somatic dysfunction of sacral region: Secondary | ICD-10-CM | POA: Diagnosis not present

## 2023-07-26 DIAGNOSIS — M9903 Segmental and somatic dysfunction of lumbar region: Secondary | ICD-10-CM | POA: Diagnosis not present

## 2023-07-26 DIAGNOSIS — M9902 Segmental and somatic dysfunction of thoracic region: Secondary | ICD-10-CM | POA: Diagnosis not present

## 2023-07-29 DIAGNOSIS — M9902 Segmental and somatic dysfunction of thoracic region: Secondary | ICD-10-CM | POA: Diagnosis not present

## 2023-07-29 DIAGNOSIS — M9904 Segmental and somatic dysfunction of sacral region: Secondary | ICD-10-CM | POA: Diagnosis not present

## 2023-07-29 DIAGNOSIS — M9901 Segmental and somatic dysfunction of cervical region: Secondary | ICD-10-CM | POA: Diagnosis not present

## 2023-07-29 DIAGNOSIS — M9903 Segmental and somatic dysfunction of lumbar region: Secondary | ICD-10-CM | POA: Diagnosis not present

## 2023-08-01 DIAGNOSIS — M9904 Segmental and somatic dysfunction of sacral region: Secondary | ICD-10-CM | POA: Diagnosis not present

## 2023-08-01 DIAGNOSIS — M9903 Segmental and somatic dysfunction of lumbar region: Secondary | ICD-10-CM | POA: Diagnosis not present

## 2023-08-01 DIAGNOSIS — M9901 Segmental and somatic dysfunction of cervical region: Secondary | ICD-10-CM | POA: Diagnosis not present

## 2023-08-01 DIAGNOSIS — M9902 Segmental and somatic dysfunction of thoracic region: Secondary | ICD-10-CM | POA: Diagnosis not present

## 2023-08-08 ENCOUNTER — Ambulatory Visit

## 2023-08-08 DIAGNOSIS — M818 Other osteoporosis without current pathological fracture: Secondary | ICD-10-CM | POA: Diagnosis not present

## 2023-08-08 MED ORDER — DENOSUMAB 60 MG/ML ~~LOC~~ SOSY: 60.0000 mg | PREFILLED_SYRINGE | SUBCUTANEOUS | Status: AC

## 2023-08-08 NOTE — Progress Notes (Cosign Needed Addendum)
 Pt presented today for a prolia  injection. Right deltoid, SQ. Pt tolerated injection well. Pt stated that she does not do well with injections and blood draws and sometimes gets a little woozy but said that a piece of minty candy always helps. I finished giving injection, then got a soft peppermint for her. I advised pt to stay sitted for a little bit to make sure she was okay before leaving. Pt did not want to wait and stated that she was good she just needed fresh air. I walked pt out to make sure she was okay before getting in the car. Asked pt one last time if she was okay and she assured me that she was good to leave.

## 2023-08-12 ENCOUNTER — Ambulatory Visit
Admission: RE | Admit: 2023-08-12 | Discharge: 2023-08-12 | Disposition: A | Discharge status: Home / Self Care | Source: Ambulatory Visit | Attending: Internal Medicine | Admitting: Internal Medicine

## 2023-08-12 DIAGNOSIS — M9901 Segmental and somatic dysfunction of cervical region: Secondary | ICD-10-CM | POA: Diagnosis not present

## 2023-08-12 DIAGNOSIS — M9904 Segmental and somatic dysfunction of sacral region: Secondary | ICD-10-CM | POA: Diagnosis not present

## 2023-08-12 DIAGNOSIS — M9903 Segmental and somatic dysfunction of lumbar region: Secondary | ICD-10-CM | POA: Diagnosis not present

## 2023-08-12 DIAGNOSIS — Z1231 Encounter for screening mammogram for malignant neoplasm of breast: Secondary | ICD-10-CM | POA: Diagnosis not present

## 2023-08-12 DIAGNOSIS — M9902 Segmental and somatic dysfunction of thoracic region: Secondary | ICD-10-CM | POA: Diagnosis not present

## 2023-08-19 DIAGNOSIS — M9904 Segmental and somatic dysfunction of sacral region: Secondary | ICD-10-CM | POA: Diagnosis not present

## 2023-08-19 DIAGNOSIS — M9901 Segmental and somatic dysfunction of cervical region: Secondary | ICD-10-CM | POA: Diagnosis not present

## 2023-08-19 DIAGNOSIS — M9902 Segmental and somatic dysfunction of thoracic region: Secondary | ICD-10-CM | POA: Diagnosis not present

## 2023-08-19 DIAGNOSIS — M9903 Segmental and somatic dysfunction of lumbar region: Secondary | ICD-10-CM | POA: Diagnosis not present

## 2023-08-20 ENCOUNTER — Inpatient Hospital Stay (HOSPITAL_BASED_OUTPATIENT_CLINIC_OR_DEPARTMENT_OTHER): Admitting: Internal Medicine

## 2023-08-20 ENCOUNTER — Encounter: Payer: Self-pay | Admitting: Internal Medicine

## 2023-08-20 ENCOUNTER — Inpatient Hospital Stay: Attending: Internal Medicine

## 2023-08-20 VITALS — BP 105/67 | HR 60 | Temp 97.4°F | Resp 16 | Wt 123.0 lb

## 2023-08-20 DIAGNOSIS — Z923 Personal history of irradiation: Secondary | ICD-10-CM | POA: Insufficient documentation

## 2023-08-20 DIAGNOSIS — Z1721 Progesterone receptor positive status: Secondary | ICD-10-CM | POA: Insufficient documentation

## 2023-08-20 DIAGNOSIS — Z85828 Personal history of other malignant neoplasm of skin: Secondary | ICD-10-CM | POA: Insufficient documentation

## 2023-08-20 DIAGNOSIS — C50812 Malignant neoplasm of overlapping sites of left female breast: Secondary | ICD-10-CM

## 2023-08-20 DIAGNOSIS — Z170 Estrogen receptor positive status [ER+]: Secondary | ICD-10-CM

## 2023-08-20 DIAGNOSIS — Z79899 Other long term (current) drug therapy: Secondary | ICD-10-CM | POA: Insufficient documentation

## 2023-08-20 DIAGNOSIS — Z1732 Human epidermal growth factor receptor 2 negative status: Secondary | ICD-10-CM | POA: Diagnosis not present

## 2023-08-20 DIAGNOSIS — Z853 Personal history of malignant neoplasm of breast: Secondary | ICD-10-CM | POA: Insufficient documentation

## 2023-08-20 DIAGNOSIS — Z87891 Personal history of nicotine dependence: Secondary | ICD-10-CM | POA: Diagnosis not present

## 2023-08-20 LAB — CMP (CANCER CENTER ONLY)
ALT: 20 U/L (ref 0–44)
AST: 22 U/L (ref 15–41)
Albumin: 4.6 g/dL (ref 3.5–5.0)
Alkaline Phosphatase: 55 U/L (ref 38–126)
Anion gap: 7 (ref 5–15)
BUN: 17 mg/dL (ref 6–20)
CO2: 27 mmol/L (ref 22–32)
Calcium: 9 mg/dL (ref 8.9–10.3)
Chloride: 100 mmol/L (ref 98–111)
Creatinine: 0.64 mg/dL (ref 0.44–1.00)
GFR, Estimated: >60 mL/min (ref >60)
Glucose, Bld: 101 mg/dL — ABNORMAL HIGH (ref 70–99)
Potassium: 3.6 mmol/L (ref 3.5–5.1)
Sodium: 134 mmol/L — ABNORMAL LOW (ref 135–145)
Total Bilirubin: 0.6 mg/dL (ref 0.0–1.2)
Total Protein: 7.5 g/dL (ref 6.5–8.1)

## 2023-08-20 LAB — CBC WITH DIFFERENTIAL (CANCER CENTER ONLY)
Abs Immature Granulocytes: 0.03 10*3/uL (ref 0.00–0.07)
Basophils Absolute: 0.1 10*3/uL (ref 0.0–0.1)
Basophils Relative: 1 %
Eosinophils Absolute: 0.1 10*3/uL (ref 0.0–0.5)
Eosinophils Relative: 1 %
HCT: 41.8 % (ref 36.0–46.0)
Hemoglobin: 13.8 g/dL (ref 12.0–15.0)
Immature Granulocytes: 0 %
Lymphocytes Relative: 31 %
Lymphs Abs: 2.3 10*3/uL (ref 0.7–4.0)
MCH: 31.3 pg (ref 26.0–34.0)
MCHC: 33 g/dL (ref 30.0–36.0)
MCV: 94.8 fL (ref 80.0–100.0)
Monocytes Absolute: 0.5 10*3/uL (ref 0.1–1.0)
Monocytes Relative: 6 %
Neutro Abs: 4.6 10*3/uL (ref 1.7–7.7)
Neutrophils Relative %: 61 %
Platelet Count: 283 10*3/uL (ref 150–400)
RBC: 4.41 MIL/uL (ref 3.87–5.11)
RDW: 12.4 % (ref 11.5–15.5)
WBC Count: 7.6 10*3/uL (ref 4.0–10.5)
nRBC: 0 % (ref 0.0–0.2)

## 2023-08-20 NOTE — Progress Notes (Signed)
 [name] CONSULT NOTE  Patient Care Team: Patricia Verneita CROME, MD as PCP - General (Internal Medicine) Patricia Cindy SAUNDERS, MD as Consulting Physician (Internal Medicine)  CHIEF COMPLAINTS/PURPOSE OF CONSULTATION:  Breast cancer  #  Oncology History Overview Note  53 year old female status post partial mastectomy for a stage II (T2, N0, M0) invasive mammary carcinoma ER/PR positive HER-2/neu negative by fish with Oncotype score of 24 and patient declining systemic chemotherapy. left  breast  lower inner quadrant tumor 2.patient did not want chemotherapy.  Had finished radiation therapy (October, 2014) 3.  Starting anti-hormonal therapy  Trelstarand Aromasin  nov 2014]. 4.abnormal right breast mammogram(April of 2015) biopsies negative  for malignancy  -------------------------------------------------------------  [name] is a 53 y.o. female with stage IIA left breast cancer s/p partial mastectomy and sentinel lymph node biopsy on 08/18/2012.  Pathology revealed a 2.5 cm grade III invasive ductal carcinoma of the breast.  Margins were uninvolved, but close (5 mm).  DCIS was present.  One sentinel lymph node was negative.  Tumor was ER + (60%), PR + (25%) and Her2/neu 2+ (negative by FISH).  Pathologic stage was T2N0M0.     Oncotype DX score was 24 which translated to a 16% risk of distant recurrence at 10 years with tamoxifen  alone (confidence interval 12-19%).  She declined systemic chemotherapy.  BCI testing on 06/11/2017 revealed a 12.7% risk of late recurrence (years 5-10) and a high likelihood of benefit.   She received radiation.  She was started on Trelstar (GNR[facility] agonist) and Aromasin  in 12/2012.  She had a reaction (inflammation at the injection site, swelling and fever). She was switched to Lupron  (few joint aches and headache).  Last Lupron  injection was in 08/2014.  She was on Aromasin . She began Femara  on 07/02/2017 secondary to costs.   She switched to  tamoxifen  on 09/15/2019 secondary to osteoporosis.   Bilateral breast MRI on 12/02/2017 revealed no abnormal enhancement in either breast.  Bilateral mammogram on 07/16/2019 revealed no evidence of malignancy.   CA 27.29 was 28.2 on 11/26/2013, 23.2 on 12/01/2014,  27.6 on 10/06/2015, 24.5 on 04/05/2016, 23.3 on 10/04/2016, 21.6 on 04/23/2017, 24 on 10/24/2017, 21.8 on 05/19/2018, 27.7 on 11/18/2018, 23.9 on 07/21/2019, and 13.9 on 02/22/2020.   She has been in menopause.  Last menstrual period was 11/2012.  Estradiol  was < 5 and FSH 8.2 on 01/12/2015.  Estradiol  was 7.6 and FSH 7.6 on 10/06/2015.  Estradiol  was 9.0 and FSH 23.9 on 04/05/2016.  Estradiol  was 10.6 and FSH 26.2 on 10/04/2016.  Estradiol  was < 5 and FSH 27.2 on 04/23/2017.   Bone density on 05/02/2015 revealed osteoporosis with a T-score of -2.8 in the AP spine.  Bone density on 05/21/2017 revealed osteoporosis with a T-score of -2.8 in AP spine L1-L4.  Bone density on 07/16/2019 revealed osteoporosis with a T-score of -3.1 in the AP spine L1-L4 (L3).  She is on Fosamax , calcium , and vitamin D .  # TAMOXIFEN   finished OCT 2024.   Personal history of breast cancer  06/25/2012 Initial Diagnosis   Malignant neoplasm of breast, stage 2   Basal cell carcinoma of cheek (Resolved)  05/19/2013 Initial Diagnosis   Basal cell carcinoma of cheek   Carcinoma of overlapping sites of left breast in female, estrogen receptor positive (HCC)  09/16/2020 Initial Diagnosis   Carcinoma of overlapping sites of left breast in female, estrogen receptor positive (HCC)      HISTORY OF PRESENTING ILLNESS: Patient ambulating-independently. Alone.   Patricia  Chyi Crane 53 y.o.  female postmenopausal with history of stage II ER/PR positive HER2 negative breast cancer currently s/p extended tamoxifen  [finished OCT, 2024] is here for follow-up.  Patient denies any worsening joint pains or hot flashes.  Denies any nausea vomiting abdominal pain.  Denies any  headaches.  Review of Systems  Constitutional:  Negative for chills, diaphoresis, fever, malaise/fatigue and weight loss.  HENT:  Negative for nosebleeds and sore throat.   Eyes:  Negative for double vision.  Respiratory:  Negative for cough, hemoptysis, sputum production, shortness of breath and wheezing.   Cardiovascular:  Negative for chest pain, palpitations, orthopnea and leg swelling.  Gastrointestinal:  Negative for abdominal pain, blood in stool, constipation, diarrhea, heartburn, melena, nausea and vomiting.  Genitourinary:  Negative for dysuria, frequency and urgency.  Musculoskeletal:  Negative for back pain and joint pain.  Skin: Negative.  Negative for itching and rash.  Neurological:  Negative for dizziness, tingling, focal weakness, weakness and headaches.  Endo/Heme/Allergies:  Does not bruise/bleed easily.  Psychiatric/Behavioral:  Negative for depression. The patient is not nervous/anxious and does not have insomnia.      MEDICAL HISTORY:  Past Medical History:  Diagnosis Date   Basal cell carcinoma of cheek 05/19/2013   Removed by Patricia Crane    Breast cancer [facility]) 2014    breast invasive mammary carcinoma   Chicken pox    Constipation 05/19/2013   Exposure to COVID-19 virus 08/06/2018   Heart murmur    Personal history of radiation therapy 2014   left breast ca   Shingles    Skin cancer    Thyroid  disease     SURGICAL HISTORY: Past Surgical History:  Procedure Laterality Date   BREAST BIOPSY Left 06/2012   invasive mammary   BREAST BIOPSY Right 04/22/2013   neg- core   BREAST LUMPECTOMY Left 08/18/2012   invasive mammary carcinoma. Margins close < 0.42mm but negative, LN negative   COLONOSCOPY WITH PROPOFOL  N/A 12/11/2017   Procedure: COLONOSCOPY WITH PROPOFOL ;  Surgeon: Therisa Bi, MD;  Location: [facility] ENDOSCOPY;  Service: Gastroenterology;  Laterality: N/A;   HEMORRHOID BANDING     lasix Bilateral     SOCIAL HISTORY: Social History    Socioeconomic History   Marital status: Married    Spouse name: Not on file   Number of children: 0   Years of education: Not on file   Highest education level: Associate degree: academic program  Occupational History   Not on file  Tobacco Use   Smoking status: Former    Current packs/day: 0.00    Types: Cigarettes    Quit date: 06/10/2003    Years since quitting: 20.2   Smokeless tobacco: Never   Tobacco comments:    social smoker  Vaping Use   Vaping status: Never Used  Substance and Sexual Activity   Alcohol use: Yes    Comment: occasionally   Drug use: No   Sexual activity: Not on file  Other Topics Concern   Not on file  Social History Narrative   Not on file   Social Drivers of Health   Financial Resource Strain: Low Risk  (07/04/2023)   Overall Financial Resource Strain (CARDIA)    Difficulty of Paying Living Expenses: Not very hard  Food Insecurity: No Food Insecurity (07/04/2023)   Hunger Vital Sign    Worried About Running Out of Food in the Last Year: Never true    Ran Out of Food in the Last Year: Never true  Transportation Needs: No Transportation Needs (07/04/2023)   PRAPARE - Administrator, Civil Service (Medical): No    Lack of Transportation (Non-Medical): No  Physical Activity: Insufficiently Active (07/04/2023)   Exercise Vital Sign    Days of Exercise per Week: 2 days    Minutes of Exercise per Session: 60 min  Stress: Stress Concern Present (07/04/2023)   [employer] of Occupational Health - Occupational Stress Questionnaire    Feeling of Stress : To some extent  Social Connections: Socially Integrated (07/04/2023)   Social Connection and Isolation Panel    Frequency of Communication with Friends and Family: More than three times a week    Frequency of Social Gatherings with Friends and Family: Once a week    Attends Religious Services: 1 to 4 times per year    Active Member of [employer] or Organizations: Yes    Attends Museum/gallery exhibitions officer: More than 4 times per year    Marital Status: Married  Catering manager Violence: Not on file    FAMILY HISTORY: Family History  Problem Relation Age of Onset   Hyperlipidemia Mother    Heart disease Mother    Dementia Mother 72       alzheimers   Cancer Father        tonsil ca   Hyperlipidemia Sister    Hyperlipidemia Brother    Heart disease Maternal Aunt    Hyperlipidemia Maternal Aunt    Hypertension Maternal Aunt    Heart disease Maternal Uncle    Hyperlipidemia Maternal Uncle    Stroke Maternal Uncle    Hypertension Maternal Uncle    Cancer Paternal Aunt 45       ovarian ca   Drug abuse Paternal Aunt    Cancer Paternal Uncle        lung CA ,  smoker   Breast cancer Neg Hx     ALLERGIES:  has no known allergies.  MEDICATIONS:  Current Outpatient Medications  Medication Sig Dispense Refill   ALPRAZolam  (XANAX ) 0.25 MG tablet Take 1 tablet (0.25 mg total) by mouth 2 (two) times daily as needed for sleep or anxiety. 30 tablet 0   Biotin 89999 MCG TABS Take 10,000 mcg by mouth daily.     calcium  carbonate (OS-CAL) 600 MG TABS tablet Take 600 mg by mouth 2 (two) times daily with a meal.     Cholecalciferol 50 MCG (2000 UT) CAPS Take by mouth daily.     Cranberry-Vitamin C (CRANBERRY CONCENTRATE/VITAMINC) 15000-100 MG CAPS Take 1 capsule by mouth daily.     cycloSPORINE  (RESTASIS ) 0.05 % ophthalmic emulsion Place 1 drop into affected eyes 2 (two) times daily. 180 each 3   denosumab  (PROLIA ) 60 MG/ML SOSY injection Inject 60 mg into the skin every 6 (six) months. 1 mL 1   Melatonin-Pyridoxine 3-1 MG TABS Take 3 mg by mouth as needed.      Multiple Vitamins-Minerals (MULTIVITAMIN WITH MINERALS) tablet Take 1 tablet by mouth daily.     rosuvastatin  (CRESTOR ) 5 MG tablet Take 1 tablet (5 mg total) by mouth every other day. 45 tablet 3   Current Facility-Administered Medications  Medication Dose Route Frequency Provider Last Rate Last Admin   [START  ON 02/07/2024] denosumab  (PROLIA ) injection 60 mg  60 mg Subcutaneous Q6 months Patricia Verneita CROME, MD          .  PHYSICAL EXAMINATION: ECOG PERFORMANCE STATUS: 0 - Asymptomatic  Vitals:   08/20/23 1516  BP:  105/67  Pulse: 60  Resp: 16  Temp: (!) 97.4 F (36.3 C)  SpO2: 98%    Filed Weights   08/20/23 1516  Weight: 123 lb (55.8 kg)     Physical Exam Vitals and nursing note reviewed.  Constitutional:      Comments: Alone.  Ambulating independently.  HENT:     Head: Normocephalic and atraumatic.     Mouth/Throat:     Pharynx: Oropharynx is clear.   Eyes:     Extraocular Movements: Extraocular movements intact.     Pupils: Pupils are equal, round, and reactive to light.    Cardiovascular:     Rate and Rhythm: Normal rate and regular rhythm.  Pulmonary:     Comments: Decreased breath sounds bilaterally.  Abdominal:     Palpations: Abdomen is soft.   Musculoskeletal:        General: Normal range of motion.     Cervical back: Normal range of motion.   Skin:    General: Skin is warm.   Neurological:     General: No focal deficit present.     Mental Status: She is alert and oriented to person, place, and time.   Psychiatric:        Behavior: Behavior normal.        Judgment: Judgment normal.      LABORATORY DATA:  I have reviewed the data as listed Lab Results  Component Value Date   WBC 7.6 08/20/2023   HGB 13.8 08/20/2023   HCT 41.8 08/20/2023   MCV 94.8 08/20/2023   PLT 283 08/20/2023   Recent Labs    09/11/22 1501 01/04/23 1610 07/12/23 0917 08/20/23 1506  NA 137 139 140 134*  K 3.6 4.1 4.0 3.6  CL 101 101 101 100  CO2 28 27 32 27  GLUCOSE 119* 87 118* 101*  BUN 20 15 11 17   CREATININE 0.74 0.66 0.63 0.64  CALCIUM  9.4 10.0 9.4 9.0  GFRNONAA >60  --   --  >60  PROT 7.5 7.2 7.2 7.5  ALBUMIN 4.3  --  4.6 4.6  AST 22 17 17 22   ALT 22 16 19 20   ALKPHOS 33*  --  45 55  BILITOT 0.3 0.3 0.4 0.6    RADIOGRAPHIC STUDIES: I have  personally reviewed the radiological images as listed and agreed with the findings in the report. MM 3D SCREENING MAMMOGRAM BILATERAL BREAST Result Date: 08/14/2023 CLINICAL DATA:  Screening. EXAM: DIGITAL SCREENING BILATERAL MAMMOGRAM WITH TOMOSYNTHESIS AND CAD TECHNIQUE: Bilateral screening digital craniocaudal and mediolateral oblique mammograms were obtained. Bilateral screening digital breast tomosynthesis was performed. The images were evaluated with computer-aided detection. COMPARISON:  Previous exam(s). ACR Breast Density Category c: The breasts are heterogeneously dense, which may obscure small masses. FINDINGS: There are no findings suspicious for malignancy. Stable changes of remote LEFT breast lumpectomy IMPRESSION: No mammographic evidence of malignancy. A result letter of this screening mammogram will be mailed directly to the patient. RECOMMENDATION: Screening mammogram in one year. (Code:SM-B-01Y) BI-RADS CATEGORY  2: Benign. Electronically Signed   By: Norleen Croak M.D.   On: 08/14/2023 16:46    ASSESSMENT & PLAN:   Carcinoma of overlapping sites of left breast in female, estrogen receptor positive (HCC) #  stage IIA left breast cancer ER/PR positive HER2 negative [IHC 2+ FISH negative] s/p lumpectomy and sentinel lymph node biopsy on 08/18/2012.  Grade 3.  Oncotype recurrence score 24 ; declined chemotherapy.   # Currently S/P extended adjuvant therapy tamoxifen  [  concern for osteoporosis; patient menopausal since 2014]- finished OCT 2024. BILATERAL Mammogram [pcp] June 2025-no evidence of recurrence/normal limits.   # BMD- [May 2021] Osteporosis T-score of -3.1-on prolia - 2023 June BMD T-score of -2.8. PCP on prolia . Repeat BMD in sep 2025.   # DISPOSITION: # follow up in 12 months- MD; labs- cbc/cmp/ca-27-29-Dr.B  All questions were answered. The patient knows to call the clinic with any problems, questions or concerns.    Cindy JONELLE Joe, MD 08/20/2023 3:55 PM

## 2023-08-20 NOTE — Assessment & Plan Note (Addendum)
#    stage IIA left breast cancer ER/PR positive HER2 negative [IHC 2+ FISH negative] s/p lumpectomy and sentinel lymph node biopsy on 08/18/2012.  Grade 3.  Oncotype recurrence score 24 ; declined chemotherapy.   # Currently S/P extended adjuvant therapy tamoxifen  [concern for osteoporosis; patient menopausal since 2014]- finished OCT 2024. BILATERAL Mammogram [pcp] June 2025-no evidence of recurrence/normal limits.   # BMD- [May 2021] Osteporosis T-score of -3.1-on prolia - 2023 June BMD T-score of -2.8. PCP on prolia . Repeat BMD in sep 2025.   # DISPOSITION: # follow up in 12 months- MD; labs- cbc/cmp/ca-27-29-Dr.B

## 2023-08-21 LAB — CANCER ANTIGEN 27.29: CA 27.29: 25 U/mL (ref 0.0–38.6)

## 2023-08-22 DIAGNOSIS — M9902 Segmental and somatic dysfunction of thoracic region: Secondary | ICD-10-CM | POA: Diagnosis not present

## 2023-08-22 DIAGNOSIS — M9903 Segmental and somatic dysfunction of lumbar region: Secondary | ICD-10-CM | POA: Diagnosis not present

## 2023-08-22 DIAGNOSIS — M9901 Segmental and somatic dysfunction of cervical region: Secondary | ICD-10-CM | POA: Diagnosis not present

## 2023-08-22 DIAGNOSIS — M9904 Segmental and somatic dysfunction of sacral region: Secondary | ICD-10-CM | POA: Diagnosis not present

## 2023-08-27 DIAGNOSIS — M9904 Segmental and somatic dysfunction of sacral region: Secondary | ICD-10-CM | POA: Diagnosis not present

## 2023-08-27 DIAGNOSIS — M9901 Segmental and somatic dysfunction of cervical region: Secondary | ICD-10-CM | POA: Diagnosis not present

## 2023-08-27 DIAGNOSIS — M9902 Segmental and somatic dysfunction of thoracic region: Secondary | ICD-10-CM | POA: Diagnosis not present

## 2023-08-27 DIAGNOSIS — M9903 Segmental and somatic dysfunction of lumbar region: Secondary | ICD-10-CM | POA: Diagnosis not present

## 2023-08-29 ENCOUNTER — Other Ambulatory Visit: Payer: Self-pay | Admitting: Internal Medicine

## 2023-08-29 MED ORDER — ROSUVASTATIN CALCIUM 5 MG PO TABS: 5.0000 mg | ORAL_TABLET | ORAL | 3 refills | Status: AC

## 2023-08-29 NOTE — Telephone Encounter (Signed)
 Copied from CRM [id number]. Topic: Clinical - Medication Question >> Aug 29, 2023 11:08 AM [place] A wrote: Reason for CRM: Patient said rosuvastatin  (CRESTOR ) 5 MG tablet was increased to every other day from twice a week. Patient said that is note sure if doctor will increase the amount of medication due to increase in usage-please contact

## 2023-09-11 ENCOUNTER — Other Ambulatory Visit: Payer: BC Managed Care – PPO

## 2023-09-11 ENCOUNTER — Ambulatory Visit: Payer: BC Managed Care – PPO | Admitting: Internal Medicine

## 2023-09-13 ENCOUNTER — Other Ambulatory Visit: Payer: BC Managed Care – PPO

## 2023-09-13 ENCOUNTER — Ambulatory Visit: Payer: BC Managed Care – PPO | Admitting: Internal Medicine

## 2023-10-28 ENCOUNTER — Other Ambulatory Visit (INDEPENDENT_AMBULATORY_CARE_PROVIDER_SITE_OTHER)

## 2023-10-28 DIAGNOSIS — E1169 Type 2 diabetes mellitus with other specified complication: Secondary | ICD-10-CM

## 2023-10-28 DIAGNOSIS — E785 Hyperlipidemia, unspecified: Secondary | ICD-10-CM

## 2023-10-28 DIAGNOSIS — E119 Type 2 diabetes mellitus without complications: Secondary | ICD-10-CM

## 2023-10-28 LAB — COMPREHENSIVE METABOLIC PANEL WITH GFR
ALT: 19 U/L (ref 0–35)
AST: 24 U/L (ref 0–37)
Albumin: 4.5 g/dL (ref 3.5–5.2)
Alkaline Phosphatase: 46 U/L (ref 39–117)
BUN: 15 mg/dL (ref 6–23)
CO2: 31 meq/L (ref 19–32)
Calcium: 9.6 mg/dL (ref 8.4–10.5)
Chloride: 101 meq/L (ref 96–112)
Creatinine, Ser: 0.8 mg/dL (ref 0.40–1.20)
GFR: 83.07 mL/min (ref >60.00)
Glucose, Bld: 109 mg/dL — ABNORMAL HIGH (ref 70–99)
Potassium: 4.2 meq/L (ref 3.5–5.1)
Sodium: 139 meq/L (ref 135–145)
Total Bilirubin: 0.5 mg/dL (ref 0.2–1.2)
Total Protein: 7.2 g/dL (ref 6.0–8.3)

## 2023-10-28 LAB — LIPID PANEL
Cholesterol: 189 mg/dL (ref 0–200)
HDL: 65.1 mg/dL (ref >39.00)
LDL Cholesterol: 111 mg/dL — ABNORMAL HIGH (ref 0–99)
NonHDL: 124.34
Total CHOL/HDL Ratio: 3
Triglycerides: 69 mg/dL (ref 0.0–149.0)
VLDL: 13.8 mg/dL (ref 0.0–40.0)

## 2023-10-28 LAB — LDL CHOLESTEROL, DIRECT: Direct LDL: 107 mg/dL

## 2023-10-28 LAB — HEMOGLOBIN A1C: Hgb A1c MFr Bld: 6.7 % — ABNORMAL HIGH (ref 4.6–6.5)

## 2023-10-30 ENCOUNTER — Ambulatory Visit: Payer: Self-pay | Admitting: Internal Medicine

## 2023-11-01 ENCOUNTER — Telehealth: Payer: Self-pay | Admitting: *Deleted

## 2023-11-01 ENCOUNTER — Encounter: Payer: Self-pay | Admitting: Internal Medicine

## 2023-11-01 ENCOUNTER — Ambulatory Visit: Admitting: Internal Medicine

## 2023-11-01 VITALS — BP 110/68 | HR 73 | Temp 98.3°F | Ht 61.0 in | Wt 122.2 lb

## 2023-11-01 DIAGNOSIS — E785 Hyperlipidemia, unspecified: Secondary | ICD-10-CM | POA: Diagnosis not present

## 2023-11-01 DIAGNOSIS — E119 Type 2 diabetes mellitus without complications: Secondary | ICD-10-CM

## 2023-11-01 DIAGNOSIS — R5383 Other fatigue: Secondary | ICD-10-CM

## 2023-11-01 DIAGNOSIS — E1169 Type 2 diabetes mellitus with other specified complication: Secondary | ICD-10-CM

## 2023-11-01 MED ORDER — EZETIMIBE 10 MG PO TABS: 10.0000 mg | ORAL_TABLET | Freq: Every day | ORAL | 1 refills | Status: AC

## 2023-11-01 MED ORDER — ALPRAZOLAM 0.25 MG PO TABS: 0.2500 mg | ORAL_TABLET | Freq: Two times a day (BID) | ORAL | 0 refills | Status: AC | PRN

## 2023-11-01 NOTE — Patient Instructions (Addendum)
  I want to lower your cholesterol by adding Zetia .  Your LDL is 111,  Goal  Is 70 to 100.  It works by inhibiting the absorption of cholesterol by the small intestine, so it lowers LDL .  I will send it to your pharmacy for a 3 month trial.     Please schedule your diabetic eye exam  in October  Let's repeat your labs in 3 months

## 2023-11-01 NOTE — Telephone Encounter (Signed)
 Pt was asking about logistics of prior auth and scheduling for Prolia . She will be out of town for her 6 month mark of 12/19, asking about scheduling earlier than that but unsure if insurance will cover if she does. Please reach out to her and have her call back to schedule for appropriate time period.

## 2023-11-01 NOTE — Progress Notes (Unsigned)
 Subjective:  Patient ID: Patricia Crane, female    DOB: 06/08/1970  Age: 53 y.o. MRN: [id number]  CC: The primary encounter diagnosis was Eye exam normal. Diagnoses of Diabetes mellitus type 2 in nonobese [facility]), Hyperlipidemia associated with type 2 diabetes mellitus (HCC), and Fatigue, unspecified type were also pertinent to this visit.   HPI Patricia Crane presents for  Chief Complaint  Patient presents with   Medical Management of Chronic Issues    Type 2 DM;;   Using berberine to managed fasting hyperglycemica  has seen a drop of 15-20 pt in the morning  sugars.   Outpatient Medications Prior to Visit  Medication Sig Dispense Refill   ALPRAZolam  (XANAX ) 0.25 MG tablet Take 1 tablet (0.25 mg total) by mouth 2 (two) times daily as needed for sleep or anxiety. 30 tablet 0   Biotin 89999 MCG TABS Take 10,000 mcg by mouth daily.     calcium  carbonate (OS-CAL) 600 MG TABS tablet Take 600 mg by mouth 2 (two) times daily with a meal.     Cholecalciferol 50 MCG (2000 UT) CAPS Take by mouth daily.     Cranberry-Vitamin C (CRANBERRY CONCENTRATE/VITAMINC) 15000-100 MG CAPS Take 1 capsule by mouth daily.     denosumab  (PROLIA ) 60 MG/ML SOSY injection Inject 60 mg into the skin every 6 (six) months. 1 mL 1   Melatonin-Pyridoxine 3-1 MG TABS Take 3 mg by mouth as needed.      Multiple Vitamins-Minerals (MULTIVITAMIN WITH MINERALS) tablet Take 1 tablet by mouth daily.     rosuvastatin  (CRESTOR ) 5 MG tablet Take 1 tablet (5 mg total) by mouth every other day. 45 tablet 3   cycloSPORINE  (RESTASIS ) 0.05 % ophthalmic emulsion Place 1 drop into affected eyes 2 (two) times daily. 180 each 3   Facility-Administered Medications Prior to Visit  Medication Dose Route Frequency Provider Last Rate Last Admin   [START ON 02/07/2024] denosumab  (PROLIA ) injection 60 mg  60 mg Subcutaneous Q6 months Marylynn Verneita CROME, MD        Review of Systems;  Patient denies headache, fevers, malaise, unintentional weight  loss, skin rash, eye pain, sinus congestion and sinus pain, sore throat, dysphagia,  hemoptysis , cough, dyspnea, wheezing, chest pain, palpitations, orthopnea, edema, abdominal pain, nausea, melena, diarrhea, constipation, flank pain, dysuria, hematuria, urinary  Frequency, nocturia, numbness, tingling, seizures,  Focal weakness, Loss of consciousness,  Tremor, insomnia, depression, anxiety, and suicidal ideation.      Objective:  BP 110/68   Pulse 73   Temp 98.3 F (36.8 C) (Oral)   Ht 5' 1 (1.549 m)   Wt 122 lb 3.2 oz (55.4 kg)   LMP 12/03/2012 (Approximate)   SpO2 96%   BMI 23.09 kg/m   BP Readings from Last 3 Encounters:  11/01/23 110/68  08/20/23 105/67  07/05/23 120/68    Wt Readings from Last 3 Encounters:  11/01/23 122 lb 3.2 oz (55.4 kg)  08/20/23 123 lb (55.8 kg)  07/05/23 121 lb 9.6 oz (55.2 kg)    Physical Exam  Lab Results  Component Value Date   HGBA1C 6.7 (H) 10/28/2023   HGBA1C 6.5 07/12/2023   HGBA1C 6.2 (A) 07/05/2023    Lab Results  Component Value Date   CREATININE 0.80 10/28/2023   CREATININE 0.64 08/20/2023   CREATININE 0.63 07/12/2023    Lab Results  Component Value Date   WBC 7.6 08/20/2023   HGB 13.8 08/20/2023   HCT 41.8 08/20/2023   PLT 283  08/20/2023   GLUCOSE 109 (H) 10/28/2023   CHOL 189 10/28/2023   TRIG 69.0 10/28/2023   HDL 65.10 10/28/2023   LDLDIRECT 107.0 10/28/2023   LDLCALC 111 (H) 10/28/2023   ALT 19 10/28/2023   AST 24 10/28/2023   NA 139 10/28/2023   K 4.2 10/28/2023   CL 101 10/28/2023   CREATININE 0.80 10/28/2023   BUN 15 10/28/2023   CO2 31 10/28/2023   TSH 1.10 01/04/2023   HGBA1C 6.7 (H) 10/28/2023   MICROALBUR <0.2 01/04/2023    MM 3D SCREENING MAMMOGRAM BILATERAL BREAST Result Date: 08/14/2023 CLINICAL DATA:  Screening. EXAM: DIGITAL SCREENING BILATERAL MAMMOGRAM WITH TOMOSYNTHESIS AND CAD TECHNIQUE: Bilateral screening digital craniocaudal and mediolateral oblique mammograms were obtained.  Bilateral screening digital breast tomosynthesis was performed. The images were evaluated with computer-aided detection. COMPARISON:  Previous exam(s). ACR Breast Density Category c: The breasts are heterogeneously dense, which may obscure small masses. FINDINGS: There are no findings suspicious for malignancy. Stable changes of remote LEFT breast lumpectomy IMPRESSION: No mammographic evidence of malignancy. A result letter of this screening mammogram will be mailed directly to the patient. RECOMMENDATION: Screening mammogram in one year. (Code:SM-B-01Y) BI-RADS CATEGORY  2: Benign. Electronically Signed   By: Norleen Croak M.D.   On: 08/14/2023 16:46    Assessment & Plan:  .Eye exam normal  Diabetes mellitus type 2 in nonobese (HCC)  Hyperlipidemia associated with type 2 diabetes mellitus (HCC)  Fatigue, unspecified type     I spent 34 minutes on the day of this face to face encounter reviewing patient's  most recent visit with cardiology,  nephrology,  and neurology,  prior relevant surgical and non surgical procedures, recent  labs and imaging studies, counseling on weight management,  reviewing the assessment and plan with patient, and post visit ordering and reviewing of  diagnostics and therapeutics with patient  .   Follow-up: No follow-ups on file.   Verneita LITTIE Kettering, MD

## 2023-11-03 NOTE — Assessment & Plan Note (Signed)
 Remains well controlled on diet alone. Tolerating Rosuvastatin  5 mg twice weekly. Adding zetia  for goal LD of 70  Lab Results  Component Value Date   HGBA1C 6.7 (H) 10/28/2023   Lab Results  Component Value Date   MICROALBUR <0.2 01/04/2023   MICROALBUR <0.2 12/08/2021   Lab Results  Component Value Date   CHOL 189 10/28/2023   HDL 65.10 10/28/2023   LDLCALC 111 (H) 10/28/2023   LDLDIRECT 107.0 10/28/2023   TRIG 69.0 10/28/2023   CHOLHDL 3 10/28/2023     Remains well controlled on diet alone.  No proteinuria.  Tolerating Rosuvastatin  5 mg twice weekly.  Lab Results  Component Value Date   HGBA1C 6.7 (H) 10/28/2023   Lab Results  Component Value Date   MICROALBUR <0.2 01/04/2023   MICROALBUR <0.2 12/08/2021   Lab Results  Component Value Date   CHOL 189 10/28/2023   HDL 65.10 10/28/2023   LDLCALC 111 (H) 10/28/2023   LDLDIRECT 107.0 10/28/2023   TRIG 69.0 10/28/2023   CHOLHDL 3 10/28/2023

## 2023-11-07 ENCOUNTER — Ambulatory Visit
Admission: RE | Admit: 2023-11-07 | Discharge: 2023-11-07 | Disposition: A | Discharge status: Home / Self Care | Source: Ambulatory Visit | Attending: Internal Medicine | Admitting: Internal Medicine

## 2023-11-07 DIAGNOSIS — Z780 Asymptomatic menopausal state: Secondary | ICD-10-CM | POA: Diagnosis not present

## 2023-11-07 DIAGNOSIS — M818 Other osteoporosis without current pathological fracture: Secondary | ICD-10-CM | POA: Diagnosis not present

## 2023-11-07 DIAGNOSIS — M8589 Other specified disorders of bone density and structure, multiple sites: Secondary | ICD-10-CM | POA: Diagnosis not present

## 2023-11-11 ENCOUNTER — Encounter: Payer: Self-pay | Admitting: Internal Medicine

## 2023-11-12 DIAGNOSIS — M9904 Segmental and somatic dysfunction of sacral region: Secondary | ICD-10-CM | POA: Diagnosis not present

## 2023-11-12 DIAGNOSIS — M9901 Segmental and somatic dysfunction of cervical region: Secondary | ICD-10-CM | POA: Diagnosis not present

## 2023-11-12 DIAGNOSIS — M9903 Segmental and somatic dysfunction of lumbar region: Secondary | ICD-10-CM | POA: Diagnosis not present

## 2023-11-12 DIAGNOSIS — M9902 Segmental and somatic dysfunction of thoracic region: Secondary | ICD-10-CM | POA: Diagnosis not present

## 2023-12-23 ENCOUNTER — Encounter: Payer: Self-pay | Admitting: Internal Medicine

## 2023-12-23 NOTE — Telephone Encounter (Signed)
 I have pended order for your approval.

## 2023-12-24 MED ORDER — FREESTYLE LIBRE 3 SENSOR MISC
11 refills | Status: AC
Start: 2023-12-24 — End: ?

## 2024-01-03 DIAGNOSIS — H2513 Age-related nuclear cataract, bilateral: Secondary | ICD-10-CM | POA: Diagnosis not present

## 2024-01-03 DIAGNOSIS — E119 Type 2 diabetes mellitus without complications: Secondary | ICD-10-CM | POA: Diagnosis not present

## 2024-01-03 DIAGNOSIS — H04123 Dry eye syndrome of bilateral lacrimal glands: Secondary | ICD-10-CM | POA: Diagnosis not present

## 2024-01-03 LAB — OPHTHALMOLOGY REPORT-SCANNED

## 2024-01-08 ENCOUNTER — Telehealth: Payer: Self-pay

## 2024-01-08 NOTE — Telephone Encounter (Signed)
 Prolia  VOB initiated via [email or web address]  Next Prolia  inj DUE: 02/07/24

## 2024-01-10 ENCOUNTER — Ambulatory Visit: Admitting: Internal Medicine

## 2024-01-13 ENCOUNTER — Other Ambulatory Visit (HOSPITAL_COMMUNITY): Payer: Self-pay

## 2024-01-13 NOTE — Telephone Encounter (Signed)
 PA APPROVED

## 2024-01-13 NOTE — Telephone Encounter (Signed)
   Called to check benefits. Per representative: PA required, 100% covered, no deductible

## 2024-01-13 NOTE — Telephone Encounter (Signed)
 PA SUBMITTED VIA BLUE E

## 2024-01-13 NOTE — Telephone Encounter (Signed)
 Pt ready for scheduling for PROLIA  on or after : 02/07/24   Option# 1: Buy/Bill (Office supplied medication)   Out-of-pocket cost due at time of clinic visit: $0   Number of injection/visits approved: 2   Primary: [employer] Prolia  co-insurance: 0% Admin fee co-insurance: 0%   Secondary: --- Prolia  co-insurance:  Admin fee co-insurance:    Medical Benefit Details: Date Benefits were checked: 01/13/24 Deductible: NO/ Coinsurance: 0%/ Admin Fee: 0%   Prior Auth: APPROVED PA# [id number] Expiration Date: 01/18/24-02/19/24  # of doses approved: 2 ----------------------------------------------------------------------- Option# 2- Med Obtained from pharmacy:   Pharmacy benefit: Copay $--- (Paid to pharmacy) Admin Fee: --- (Pay at clinic)   Prior Auth: --- PA# Expiration Date:   # of doses approved:     If patient wants fill through the pharmacy benefit please send prescription to: ---, and include estimated need by date in rx notes. Pharmacy will ship medication directly to the office.   Patient IS eligible for Prolia  Copay Card. Copay Card can make patient's cost as little as $25. Link to apply: [email or web address]://www.amgensupportplus.[email or web address]/copay   ** This summary of benefits is an estimation of the patient's out-of-pocket cost. Exact cost may very based on individual plan coverage.

## 2024-01-15 DIAGNOSIS — H04123 Dry eye syndrome of bilateral lacrimal glands: Secondary | ICD-10-CM | POA: Diagnosis not present

## 2024-01-24 ENCOUNTER — Other Ambulatory Visit

## 2024-01-28 ENCOUNTER — Ambulatory Visit: Admitting: Internal Medicine

## 2024-02-25 ENCOUNTER — Other Ambulatory Visit: Payer: Self-pay | Admitting: *Deleted

## 2024-02-25 ENCOUNTER — Encounter: Payer: Self-pay | Admitting: *Deleted

## 2024-02-25 DIAGNOSIS — E559 Vitamin D deficiency, unspecified: Secondary | ICD-10-CM

## 2024-03-10 ENCOUNTER — Encounter: Payer: Self-pay | Admitting: Oncology

## 2024-03-11 ENCOUNTER — Telehealth: Payer: Self-pay

## 2024-03-11 NOTE — Telephone Encounter (Signed)
 Prolia VOB initiated via [email or web address]  Next Prolia inj DUE: NOW

## 2024-03-12 ENCOUNTER — Encounter: Payer: Self-pay | Admitting: Oncology

## 2024-03-12 ENCOUNTER — Other Ambulatory Visit (HOSPITAL_COMMUNITY): Payer: Self-pay

## 2024-03-12 NOTE — Telephone Encounter (Signed)
 Pt ready for scheduling for PROLIA  on or after : 03/12/24  Option# 1: Buy/Bill (Office supplied medication)  Out-of-pocket cost due at time of clinic visit: $20  Number of injection/visits approved: ---  Primary: [employer] Prolia  co-insurance: $79 Admin fee co-insurance: 0%  Secondary: --- Prolia  co-insurance:  Admin fee co-insurance:   Medical Benefit Details: Date Benefits were checked: 03/11/24 Deductible: NO/ Coinsurance: $20/ Admin Fee: 0%  Prior Auth: N/A PA# Expiration Date:   # of doses approved: ----------------------------------------------------------------------- Option# 2- Med Obtained from pharmacy:  Pharmacy benefit: Copay $--- (Paid to pharmacy) Admin Fee: --- (Pay at clinic)  Prior Auth: PLAN EXCLUSION PA# Expiration Date:   # of doses approved:   If patient wants fill through the pharmacy benefit please send prescription to: ---, and include estimated need by date in rx notes. Pharmacy will ship medication directly to the office.  Patient IS eligible for Prolia  Copay Card. Copay Card can make patient's cost as little as $25. Link to apply: [email or web address]://[email or web address]  ** This summary of benefits is an estimation of the patient's out-of-pocket cost. Exact cost may very based on individual plan coverage.

## 2024-03-12 NOTE — Telephone Encounter (Signed)
 SABRA

## 2024-03-27 ENCOUNTER — Ambulatory Visit: Admitting: Internal Medicine

## 2024-03-27 ENCOUNTER — Encounter: Payer: Self-pay | Admitting: Internal Medicine

## 2024-03-27 ENCOUNTER — Encounter: Payer: Self-pay | Admitting: Oncology

## 2024-03-27 VITALS — BP 104/70 | HR 69 | Temp 97.4°F | Ht 60.0 in | Wt 124.8 lb

## 2024-03-27 DIAGNOSIS — E559 Vitamin D deficiency, unspecified: Secondary | ICD-10-CM

## 2024-03-27 DIAGNOSIS — Z0000 Encounter for general adult medical examination without abnormal findings: Secondary | ICD-10-CM

## 2024-03-27 DIAGNOSIS — E119 Type 2 diabetes mellitus without complications: Secondary | ICD-10-CM

## 2024-03-27 DIAGNOSIS — M818 Other osteoporosis without current pathological fracture: Secondary | ICD-10-CM

## 2024-03-27 MED ORDER — DENOSUMAB 60 MG/ML ~~LOC~~ SOSY
60.0000 mg | PREFILLED_SYRINGE | SUBCUTANEOUS | Status: AC
  Administered 2024-03-27: 60 mg via SUBCUTANEOUS

## 2024-03-27 MED ORDER — DENOSUMAB 60 MG/ML ~~LOC~~ SOSY: 60.0000 mg | PREFILLED_SYRINGE | SUBCUTANEOUS | Status: AC

## 2024-03-27 NOTE — Assessment & Plan Note (Signed)

## 2024-03-27 NOTE — Progress Notes (Unsigned)
 Patient ID: Patricia Crane, female    DOB: 06-19-1970  Age: 54 y.o. MRN: [id number]  The patient is here for annual preventive examination and management of other chronic and acute problems.   The risk factors are reflected in the social history.   The roster of all physicians providing medical care to patient - is listed in the Snapshot section of the chart.   Activities of daily living:  The patient is 100% independent in all ADLs: dressing, toileting, feeding as well as independent mobility   Home safety : The patient has smoke detectors in the home. They wear seatbelts.  There are no unsecured firearms at home. There is no violence in the home.    There is no risks for hepatitis, STDs or HIV. There is no   history of blood transfusion. They have no travel history to infectious disease endemic areas of the world.   The patient has seen their dentist in the last six month. They have seen their eye doctor in the last year. The patinet  denies slight hearing difficulty with regard to whispered voices and some television programs.  They have deferred audiologic testing in the last year.  They do not  have excessive sun exposure. Discussed the need for sun protection: hats, long sleeves and use of sunscreen if there is significant sun exposure.    Diet: the importance of a healthy diet is discussed. They do have a healthy diet.   The benefits of regular aerobic exercise were discussed. The patient  exercises  3 to 5 days per week  for  60 minutes.    Depression screen: there are no signs or vegative symptoms of depression- irritability, change in appetite, anhedonia, sadness/tearfullness.   The following portions of the patient's history were reviewed and updated as appropriate: allergies, current medications, past family history, past medical history,  past surgical history, past social history  and problem list.   Visual acuity was not assessed per patient preference since the patient has  regular follow up with an  ophthalmologist. Hearing and body mass index were assessed and reviewed.    During the course of the visit the patient was educated and counseled about appropriate screening and preventive services including : fall prevention , diabetes screening, nutrition counseling, colorectal cancer screening, and recommended immunizations.    Chief Complaint:   1) follow up on type 2 DM: controlling with diet and now taking berberine twice daily qac .  Just returned from 4 weeks in  [place] taking care of mother.  . Ran out of zetia  during trip, restarted it this week  still taking crestor    2) osteoporosis:  due for Prolia  injection today    Review of Symptoms  Patient denies headache, fevers, malaise, unintentional weight loss, skin rash, eye pain, sinus congestion and sinus pain, sore throat, dysphagia,  hemoptysis , cough, dyspnea, wheezing, chest pain, palpitations, orthopnea, edema, abdominal pain, nausea, melena, diarrhea, constipation, flank pain, dysuria, hematuria, urinary  Frequency, nocturia, numbness, tingling, seizures,  Focal weakness, Loss of consciousness,  Tremor, insomnia, depression, anxiety, and suicidal ideation.    Physical Exam:  LMP 12/03/2012    Physical Exam Vitals reviewed.  Constitutional:      General: She is not in acute distress.    Appearance: Normal appearance. She is well-developed and normal weight. She is not ill-appearing, toxic-appearing or diaphoretic.  HENT:     Head: Normocephalic.     Right Ear: Tympanic membrane, ear canal and external ear  normal. There is no impacted cerumen.     Left Ear: Tympanic membrane, ear canal and external ear normal. There is no impacted cerumen.     Nose: Nose normal.     Mouth/Throat:     Mouth: Mucous membranes are moist.     Pharynx: Oropharynx is clear.  Eyes:     General: No scleral icterus.       Right eye: No discharge.        Left eye: No discharge.     Conjunctiva/sclera: Conjunctivae  normal.     Pupils: Pupils are equal, round, and reactive to light.  Neck:     Thyroid : No thyromegaly.     Vascular: No carotid bruit or JVD.  Cardiovascular:     Rate and Rhythm: Normal rate and regular rhythm.     Heart sounds: Normal heart sounds.  Pulmonary:     Effort: Pulmonary effort is normal. No respiratory distress.     Breath sounds: Normal breath sounds.  Chest:  Breasts:    Breasts are symmetrical.     Right: Normal. No swelling, inverted nipple, mass, nipple discharge, skin change or tenderness.     Left: Normal. No swelling, inverted nipple, mass, nipple discharge, skin change or tenderness.  Abdominal:     General: Bowel sounds are normal.     Palpations: Abdomen is soft. There is no mass.     Tenderness: There is no abdominal tenderness. There is no guarding or rebound.  Musculoskeletal:        General: Normal range of motion.     Cervical back: Normal range of motion and neck supple.  Lymphadenopathy:     Cervical: No cervical adenopathy.     Upper Body:     Right upper body: No supraclavicular, axillary or pectoral adenopathy.     Left upper body: No supraclavicular, axillary or pectoral adenopathy.  Skin:    General: Skin is warm and dry.  Neurological:     General: No focal deficit present.     Mental Status: She is alert and oriented to person, place, and time. Mental status is at baseline.  Psychiatric:        Mood and Affect: Mood normal.        Behavior: Behavior normal.        Thought Content: Thought content normal.        Judgment: Judgment normal.    Assessment and Plan: Routine general medical examination at a health care facility Assessment & Plan: age appropriate education and counseling updated, referrals for preventative services and immunizations addressed, dietary and smoking counseling addressed, most recent labs reviewed.  I have personally reviewed and have noted:   1) the patient's medical and social history 2) The pt's use of  alcohol, tobacco, and illicit drugs 3) The patient's current medications and supplements 4) Functional ability including ADL's, fall risk, home safety risk, hearing and visual impairment 5) Diet and physical activities 6) Evidence for depression or mood disorder 7) The patient's height, weight, and BMI have been recorded in the chart  I have made referrals, and provided counseling and education based on review of the above    Diabetes mellitus type 2 in nonobese (HCC) -     Microalbumin / creatinine urine ratio -     Hemoglobin A1c -     Comprehensive metabolic panel with GFR -     Lipid panel -     LDL cholesterol, direct  Vitamin D  deficiency -  VITAMIN D  25 Hydroxy (Vit-D Deficiency, Fractures)    No follow-ups on file.  Verneita LITTIE Kettering, MD

## 2024-08-19 ENCOUNTER — Ambulatory Visit: Admitting: Internal Medicine

## 2024-08-19 ENCOUNTER — Other Ambulatory Visit

## 2024-10-16 ENCOUNTER — Ambulatory Visit: Admitting: Internal Medicine
